# Patient Record
Sex: Male | Born: 1970 | Race: White | Hispanic: No | Marital: Married | Smoking: Former smoker
Health system: Southern US, Community
[De-identification: ages and names within clinical notes are randomized; demographics above are authoritative.]

## PROBLEM LIST (undated history)

## (undated) DIAGNOSIS — I2102 ST elevation (STEMI) myocardial infarction involving left anterior descending coronary artery: Secondary | ICD-10-CM

## (undated) DIAGNOSIS — I509 Heart failure, unspecified: Secondary | ICD-10-CM

## (undated) HISTORY — DX: ST elevation (STEMI) myocardial infarction involving left anterior descending coronary artery: I21.02

---

## 2019-07-02 DIAGNOSIS — I5042 Chronic combined systolic (congestive) and diastolic (congestive) heart failure: Secondary | ICD-10-CM

## 2019-07-02 HISTORY — DX: Chronic combined systolic (congestive) and diastolic (congestive) heart failure: I50.42

## 2019-07-02 HISTORY — PX: TRANSTHORACIC ECHOCARDIOGRAM: SHX275

## 2019-07-11 ENCOUNTER — Encounter (HOSPITAL_COMMUNITY): Payer: Self-pay | Admitting: Cardiology

## 2019-07-11 ENCOUNTER — Inpatient Hospital Stay (HOSPITAL_COMMUNITY)
Admission: EM | Admit: 2019-07-11 | Discharge: 2019-07-22 | DRG: 246 | Disposition: A | Discharge status: Home / Self Care | Payer: Managed Care, Other (non HMO) | Source: Other Acute Inpatient Hospital | Attending: Internal Medicine | Admitting: Internal Medicine

## 2019-07-11 ENCOUNTER — Encounter (HOSPITAL_COMMUNITY)
Admission: EM | Disposition: A | Discharge status: Home / Self Care | Payer: Self-pay | Source: Other Acute Inpatient Hospital | Attending: Cardiology

## 2019-07-11 ENCOUNTER — Inpatient Hospital Stay (HOSPITAL_COMMUNITY): Payer: Managed Care, Other (non HMO)

## 2019-07-11 ENCOUNTER — Other Ambulatory Visit: Payer: Self-pay

## 2019-07-11 DIAGNOSIS — Z978 Presence of other specified devices: Secondary | ICD-10-CM | POA: Diagnosis not present

## 2019-07-11 DIAGNOSIS — Q211 Atrial septal defect: Secondary | ICD-10-CM

## 2019-07-11 DIAGNOSIS — I2510 Atherosclerotic heart disease of native coronary artery without angina pectoris: Secondary | ICD-10-CM

## 2019-07-11 DIAGNOSIS — I4892 Unspecified atrial flutter: Secondary | ICD-10-CM | POA: Diagnosis present

## 2019-07-11 DIAGNOSIS — I213 ST elevation (STEMI) myocardial infarction of unspecified site: Secondary | ICD-10-CM | POA: Insufficient documentation

## 2019-07-11 DIAGNOSIS — R0602 Shortness of breath: Secondary | ICD-10-CM

## 2019-07-11 DIAGNOSIS — E876 Hypokalemia: Secondary | ICD-10-CM | POA: Diagnosis present

## 2019-07-11 DIAGNOSIS — G931 Anoxic brain damage, not elsewhere classified: Secondary | ICD-10-CM | POA: Diagnosis not present

## 2019-07-11 DIAGNOSIS — Y848 Other medical procedures as the cause of abnormal reaction of the patient, or of later complication, without mention of misadventure at the time of the procedure: Secondary | ICD-10-CM | POA: Diagnosis not present

## 2019-07-11 DIAGNOSIS — I255 Ischemic cardiomyopathy: Secondary | ICD-10-CM

## 2019-07-11 DIAGNOSIS — J9601 Acute respiratory failure with hypoxia: Secondary | ICD-10-CM | POA: Diagnosis present

## 2019-07-11 DIAGNOSIS — D72829 Elevated white blood cell count, unspecified: Secondary | ICD-10-CM | POA: Diagnosis present

## 2019-07-11 DIAGNOSIS — I2102 ST elevation (STEMI) myocardial infarction involving left anterior descending coronary artery: Secondary | ICD-10-CM | POA: Diagnosis present

## 2019-07-11 DIAGNOSIS — Z9861 Coronary angioplasty status: Secondary | ICD-10-CM | POA: Insufficient documentation

## 2019-07-11 DIAGNOSIS — E782 Mixed hyperlipidemia: Secondary | ICD-10-CM | POA: Diagnosis present

## 2019-07-11 DIAGNOSIS — Z20822 Contact with and (suspected) exposure to covid-19: Secondary | ICD-10-CM | POA: Diagnosis present

## 2019-07-11 DIAGNOSIS — I5021 Acute systolic (congestive) heart failure: Secondary | ICD-10-CM | POA: Diagnosis present

## 2019-07-11 DIAGNOSIS — J189 Pneumonia, unspecified organism: Secondary | ICD-10-CM

## 2019-07-11 DIAGNOSIS — G253 Myoclonus: Secondary | ICD-10-CM | POA: Diagnosis present

## 2019-07-11 DIAGNOSIS — R079 Chest pain, unspecified: Secondary | ICD-10-CM | POA: Diagnosis present

## 2019-07-11 DIAGNOSIS — E872 Acidosis: Secondary | ICD-10-CM | POA: Diagnosis present

## 2019-07-11 DIAGNOSIS — I4901 Ventricular fibrillation: Secondary | ICD-10-CM | POA: Insufficient documentation

## 2019-07-11 DIAGNOSIS — R34 Anuria and oliguria: Secondary | ICD-10-CM | POA: Diagnosis present

## 2019-07-11 DIAGNOSIS — J9811 Atelectasis: Secondary | ICD-10-CM | POA: Diagnosis not present

## 2019-07-11 DIAGNOSIS — K7200 Acute and subacute hepatic failure without coma: Secondary | ICD-10-CM

## 2019-07-11 DIAGNOSIS — I472 Ventricular tachycardia: Secondary | ICD-10-CM | POA: Diagnosis present

## 2019-07-11 DIAGNOSIS — I469 Cardiac arrest, cause unspecified: Secondary | ICD-10-CM | POA: Diagnosis present

## 2019-07-11 DIAGNOSIS — G9782 Other postprocedural complications and disorders of nervous system: Secondary | ICD-10-CM | POA: Diagnosis not present

## 2019-07-11 DIAGNOSIS — I462 Cardiac arrest due to underlying cardiac condition: Secondary | ICD-10-CM | POA: Diagnosis present

## 2019-07-11 DIAGNOSIS — J9690 Respiratory failure, unspecified, unspecified whether with hypoxia or hypercapnia: Secondary | ICD-10-CM

## 2019-07-11 DIAGNOSIS — I480 Paroxysmal atrial fibrillation: Secondary | ICD-10-CM | POA: Diagnosis not present

## 2019-07-11 DIAGNOSIS — E785 Hyperlipidemia, unspecified: Secondary | ICD-10-CM

## 2019-07-11 DIAGNOSIS — J80 Acute respiratory distress syndrome: Secondary | ICD-10-CM

## 2019-07-11 DIAGNOSIS — Z955 Presence of coronary angioplasty implant and graft: Secondary | ICD-10-CM

## 2019-07-11 DIAGNOSIS — G9340 Encephalopathy, unspecified: Secondary | ICD-10-CM

## 2019-07-11 DIAGNOSIS — J690 Pneumonitis due to inhalation of food and vomit: Secondary | ICD-10-CM | POA: Diagnosis not present

## 2019-07-11 DIAGNOSIS — R739 Hyperglycemia, unspecified: Secondary | ICD-10-CM | POA: Diagnosis present

## 2019-07-11 DIAGNOSIS — Z4659 Encounter for fitting and adjustment of other gastrointestinal appliance and device: Secondary | ICD-10-CM

## 2019-07-11 DIAGNOSIS — F329 Major depressive disorder, single episode, unspecified: Secondary | ICD-10-CM

## 2019-07-11 DIAGNOSIS — I313 Pericardial effusion (noninflammatory): Secondary | ICD-10-CM | POA: Diagnosis present

## 2019-07-11 DIAGNOSIS — I5022 Chronic systolic (congestive) heart failure: Secondary | ICD-10-CM

## 2019-07-11 DIAGNOSIS — Z452 Encounter for adjustment and management of vascular access device: Secondary | ICD-10-CM

## 2019-07-11 DIAGNOSIS — N179 Acute kidney failure, unspecified: Secondary | ICD-10-CM | POA: Diagnosis present

## 2019-07-11 DIAGNOSIS — Z9289 Personal history of other medical treatment: Secondary | ICD-10-CM

## 2019-07-11 DIAGNOSIS — F17210 Nicotine dependence, cigarettes, uncomplicated: Secondary | ICD-10-CM | POA: Diagnosis present

## 2019-07-11 DIAGNOSIS — F32A Depression, unspecified: Secondary | ICD-10-CM

## 2019-07-11 DIAGNOSIS — I959 Hypotension, unspecified: Secondary | ICD-10-CM

## 2019-07-11 DIAGNOSIS — R570 Cardiogenic shock: Secondary | ICD-10-CM | POA: Diagnosis present

## 2019-07-11 DIAGNOSIS — Z8673 Personal history of transient ischemic attack (TIA), and cerebral infarction without residual deficits: Secondary | ICD-10-CM

## 2019-07-11 DIAGNOSIS — G4700 Insomnia, unspecified: Secondary | ICD-10-CM | POA: Diagnosis not present

## 2019-07-11 HISTORY — PX: CORONARY STENT INTERVENTION: CATH118234

## 2019-07-11 HISTORY — DX: Ischemic cardiomyopathy: I25.5

## 2019-07-11 HISTORY — PX: RIGHT/LEFT HEART CATH AND CORONARY ANGIOGRAPHY: CATH118266

## 2019-07-11 HISTORY — PX: CORONARY/GRAFT ACUTE MI REVASCULARIZATION: CATH118305

## 2019-07-11 HISTORY — DX: Cardiac arrest, cause unspecified: I49.01

## 2019-07-11 HISTORY — DX: Atherosclerotic heart disease of native coronary artery without angina pectoris: I25.10

## 2019-07-11 HISTORY — PX: ARTERIAL LINE INSERTION: CATH118227

## 2019-07-11 HISTORY — DX: Cardiac arrest, cause unspecified: I46.9

## 2019-07-11 LAB — COMPREHENSIVE METABOLIC PANEL
ALT: 142 U/L — ABNORMAL HIGH (ref 0–44)
ALT: 197 U/L — ABNORMAL HIGH (ref 0–44)
AST: 483 U/L — ABNORMAL HIGH (ref 15–41)
AST: 851 U/L — ABNORMAL HIGH (ref 15–41)
Albumin: 3.6 g/dL (ref 3.5–5.0)
Albumin: 3.2 g/dL — ABNORMAL LOW (ref 3.5–5.0)
Alkaline Phosphatase: 87 U/L (ref 38–126)
Alkaline Phosphatase: 69 U/L (ref 38–126)
Anion gap: 14 (ref 5–15)
Anion gap: 11 (ref 5–15)
BUN: 21 mg/dL — ABNORMAL HIGH (ref 6–20)
BUN: 23 mg/dL — ABNORMAL HIGH (ref 6–20)
CO2: 17 mmol/L — ABNORMAL LOW (ref 22–32)
CO2: 20 mmol/L — ABNORMAL LOW (ref 22–32)
Calcium: 8.4 mg/dL — ABNORMAL LOW (ref 8.9–10.3)
Calcium: 7.6 mg/dL — ABNORMAL LOW (ref 8.9–10.3)
Chloride: 104 mmol/L (ref 98–111)
Chloride: 108 mmol/L (ref 98–111)
Creatinine, Ser: 1.41 mg/dL — ABNORMAL HIGH (ref 0.61–1.24)
Creatinine, Ser: 1.18 mg/dL (ref 0.61–1.24)
GFR calc Af Amer: >60 mL/min (ref >60)
GFR calc Af Amer: >60 mL/min (ref >60)
GFR calc non Af Amer: 59 mL/min — ABNORMAL LOW (ref >60)
GFR calc non Af Amer: >60 mL/min (ref >60)
Glucose, Bld: 249 mg/dL — ABNORMAL HIGH (ref 70–99)
Glucose, Bld: 166 mg/dL — ABNORMAL HIGH (ref 70–99)
Potassium: 4.4 mmol/L (ref 3.5–5.1)
Potassium: 3.3 mmol/L — ABNORMAL LOW (ref 3.5–5.1)
Sodium: 135 mmol/L (ref 135–145)
Sodium: 139 mmol/L (ref 135–145)
Total Bilirubin: 0.9 mg/dL (ref 0.3–1.2)
Total Bilirubin: 0.9 mg/dL (ref 0.3–1.2)
Total Protein: 6.3 g/dL — ABNORMAL LOW (ref 6.5–8.1)
Total Protein: 5.5 g/dL — ABNORMAL LOW (ref 6.5–8.1)

## 2019-07-11 LAB — POCT I-STAT 7, (LYTES, BLD GAS, ICA,H+H)
Acid-base deficit: 10 mmol/L — ABNORMAL HIGH (ref 0.0–2.0)
Acid-base deficit: 3 mmol/L — ABNORMAL HIGH (ref 0.0–2.0)
Acid-base deficit: 8 mmol/L — ABNORMAL HIGH (ref 0.0–2.0)
Acid-base deficit: 7 mmol/L — ABNORMAL HIGH (ref 0.0–2.0)
Bicarbonate: 19.5 mmol/L — ABNORMAL LOW (ref 20.0–28.0)
Bicarbonate: 19.3 mmol/L — ABNORMAL LOW (ref 20.0–28.0)
Bicarbonate: 24.7 mmol/L (ref 20.0–28.0)
Bicarbonate: 16.5 mmol/L — ABNORMAL LOW (ref 20.0–28.0)
Calcium, Ion: 1.2 mmol/L (ref 1.15–1.40)
Calcium, Ion: 1.11 mmol/L — ABNORMAL LOW (ref 1.15–1.40)
Calcium, Ion: 1.17 mmol/L (ref 1.15–1.40)
Calcium, Ion: 1.16 mmol/L (ref 1.15–1.40)
HCT: 47 % (ref 39.0–52.0)
HCT: 45 % (ref 39.0–52.0)
HCT: 46 % (ref 39.0–52.0)
HCT: 43 % (ref 39.0–52.0)
Hemoglobin: 16 g/dL (ref 13.0–17.0)
Hemoglobin: 15.3 g/dL (ref 13.0–17.0)
Hemoglobin: 15.6 g/dL (ref 13.0–17.0)
Hemoglobin: 14.6 g/dL (ref 13.0–17.0)
O2 Saturation: 99 %
O2 Saturation: 100 %
O2 Saturation: 99 %
O2 Saturation: 98 %
Patient temperature: 35.6
Potassium: 4.4 mmol/L (ref 3.5–5.1)
Potassium: 4.3 mmol/L (ref 3.5–5.1)
Potassium: 4.5 mmol/L (ref 3.5–5.1)
Potassium: 6 mmol/L — ABNORMAL HIGH (ref 3.5–5.1)
Sodium: 136 mmol/L (ref 135–145)
Sodium: 135 mmol/L (ref 135–145)
Sodium: 139 mmol/L (ref 135–145)
Sodium: 137 mmol/L (ref 135–145)
TCO2: 21 mmol/L — ABNORMAL LOW (ref 22–32)
TCO2: 21 mmol/L — ABNORMAL LOW (ref 22–32)
TCO2: 26 mmol/L (ref 22–32)
TCO2: 17 mmol/L — ABNORMAL LOW (ref 22–32)
pCO2 arterial: 53.6 mmHg — ABNORMAL HIGH (ref 32.0–48.0)
pCO2 arterial: 46.2 mmHg (ref 32.0–48.0)
pCO2 arterial: 53.5 mmHg — ABNORMAL HIGH (ref 32.0–48.0)
pCO2 arterial: 26.7 mmHg — ABNORMAL LOW (ref 32.0–48.0)
pH, Arterial: 7.169 — CL (ref 7.350–7.450)
pH, Arterial: 7.229 — ABNORMAL LOW (ref 7.350–7.450)
pH, Arterial: 7.272 — ABNORMAL LOW (ref 7.350–7.450)
pH, Arterial: 7.393 (ref 7.350–7.450)
pO2, Arterial: 187 mmHg — ABNORMAL HIGH (ref 83.0–108.0)
pO2, Arterial: 164 mmHg — ABNORMAL HIGH (ref 83.0–108.0)
pO2, Arterial: 230 mmHg — ABNORMAL HIGH (ref 83.0–108.0)
pO2, Arterial: 101 mmHg (ref 83.0–108.0)

## 2019-07-11 LAB — POCT ACTIVATED CLOTTING TIME
Activated Clotting Time: 263 seconds
Activated Clotting Time: 307 seconds

## 2019-07-11 LAB — POCT I-STAT EG7
Acid-base deficit: 1 mmol/L (ref 0.0–2.0)
Acid-base deficit: 1 mmol/L (ref 0.0–2.0)
Bicarbonate: 27 mmol/L (ref 20.0–28.0)
Bicarbonate: 27.4 mmol/L (ref 20.0–28.0)
Calcium, Ion: 1.11 mmol/L — ABNORMAL LOW (ref 1.15–1.40)
Calcium, Ion: 1.11 mmol/L — ABNORMAL LOW (ref 1.15–1.40)
HCT: 45 % (ref 39.0–52.0)
HCT: 45 % (ref 39.0–52.0)
Hemoglobin: 15.3 g/dL (ref 13.0–17.0)
Hemoglobin: 15.3 g/dL (ref 13.0–17.0)
O2 Saturation: 67 %
O2 Saturation: 68 %
Potassium: 4.3 mmol/L (ref 3.5–5.1)
Potassium: 4.3 mmol/L (ref 3.5–5.1)
Sodium: 140 mmol/L (ref 135–145)
Sodium: 140 mmol/L (ref 135–145)
TCO2: 29 mmol/L (ref 22–32)
TCO2: 29 mmol/L (ref 22–32)
pCO2, Ven: 58.7 mmHg (ref 44.0–60.0)
pCO2, Ven: 59.6 mmHg (ref 44.0–60.0)
pH, Ven: 7.27 (ref 7.250–7.430)
pH, Ven: 7.271 (ref 7.250–7.430)
pO2, Ven: 41 mmHg (ref 32.0–45.0)
pO2, Ven: 41 mmHg (ref 32.0–45.0)

## 2019-07-11 LAB — BASIC METABOLIC PANEL
Anion gap: 9 (ref 5–15)
BUN: 31 mg/dL — ABNORMAL HIGH (ref 6–20)
CO2: 19 mmol/L — ABNORMAL LOW (ref 22–32)
Calcium: 7.5 mg/dL — ABNORMAL LOW (ref 8.9–10.3)
Chloride: 111 mmol/L (ref 98–111)
Creatinine, Ser: 1.65 mg/dL — ABNORMAL HIGH (ref 0.61–1.24)
GFR calc Af Amer: 57 mL/min — ABNORMAL LOW (ref >60)
GFR calc non Af Amer: 49 mL/min — ABNORMAL LOW (ref >60)
Glucose, Bld: 120 mg/dL — ABNORMAL HIGH (ref 70–99)
Potassium: 5.6 mmol/L — ABNORMAL HIGH (ref 3.5–5.1)
Sodium: 139 mmol/L (ref 135–145)

## 2019-07-11 LAB — GLUCOSE, CAPILLARY
Glucose-Capillary: 110 mg/dL — ABNORMAL HIGH (ref 70–99)
Glucose-Capillary: 130 mg/dL — ABNORMAL HIGH (ref 70–99)
Glucose-Capillary: 104 mg/dL — ABNORMAL HIGH (ref 70–99)
Glucose-Capillary: 111 mg/dL — ABNORMAL HIGH (ref 70–99)
Glucose-Capillary: 94 mg/dL (ref 70–99)

## 2019-07-11 LAB — CBC
HCT: 47.6 % (ref 39.0–52.0)
HCT: 46.3 % (ref 39.0–52.0)
Hemoglobin: 15.6 g/dL (ref 13.0–17.0)
Hemoglobin: 15.5 g/dL (ref 13.0–17.0)
MCH: 31.1 pg (ref 26.0–34.0)
MCH: 30.8 pg (ref 26.0–34.0)
MCHC: 32.8 g/dL (ref 30.0–36.0)
MCHC: 33.5 g/dL (ref 30.0–36.0)
MCV: 94.8 fL (ref 80.0–100.0)
MCV: 91.9 fL (ref 80.0–100.0)
Platelets: 269 10*3/uL (ref 150–400)
Platelets: 255 10*3/uL (ref 150–400)
RBC: 5.02 MIL/uL (ref 4.22–5.81)
RBC: 5.04 MIL/uL (ref 4.22–5.81)
RDW: 11.9 % (ref 11.5–15.5)
RDW: 12 % (ref 11.5–15.5)
WBC: 27.3 10*3/uL — ABNORMAL HIGH (ref 4.0–10.5)
WBC: 18.8 10*3/uL — ABNORMAL HIGH (ref 4.0–10.5)
nRBC: 0 % (ref 0.0–0.2)
nRBC: 0 % (ref 0.0–0.2)

## 2019-07-11 LAB — POCT I-STAT, CHEM 8
BUN: 23 mg/dL — ABNORMAL HIGH (ref 6–20)
BUN: 25 mg/dL — ABNORMAL HIGH (ref 6–20)
Calcium, Ion: 1.19 mmol/L (ref 1.15–1.40)
Calcium, Ion: 1.09 mmol/L — ABNORMAL LOW (ref 1.15–1.40)
Chloride: 105 mmol/L (ref 98–111)
Chloride: 109 mmol/L (ref 98–111)
Creatinine, Ser: 1.3 mg/dL — ABNORMAL HIGH (ref 0.61–1.24)
Creatinine, Ser: 1 mg/dL (ref 0.61–1.24)
Glucose, Bld: 244 mg/dL — ABNORMAL HIGH (ref 70–99)
Glucose, Bld: 148 mg/dL — ABNORMAL HIGH (ref 70–99)
HCT: 47 % (ref 39.0–52.0)
HCT: 43 % (ref 39.0–52.0)
Hemoglobin: 16 g/dL (ref 13.0–17.0)
Hemoglobin: 14.6 g/dL (ref 13.0–17.0)
Potassium: 4.4 mmol/L (ref 3.5–5.1)
Potassium: 3.6 mmol/L (ref 3.5–5.1)
Sodium: 136 mmol/L (ref 135–145)
Sodium: 138 mmol/L (ref 135–145)
TCO2: 20 mmol/L — ABNORMAL LOW (ref 22–32)
TCO2: 21 mmol/L — ABNORMAL LOW (ref 22–32)

## 2019-07-11 LAB — COOXEMETRY PANEL
Carboxyhemoglobin: 0.4 % — ABNORMAL LOW (ref 0.5–1.5)
Methemoglobin: 0.8 % (ref 0.0–1.5)
O2 Saturation: 56.2 %
Total hemoglobin: 12.8 g/dL (ref 12.0–16.0)

## 2019-07-11 LAB — MAGNESIUM
Magnesium: 2.6 mg/dL — ABNORMAL HIGH (ref 1.7–2.4)
Magnesium: 2.5 mg/dL — ABNORMAL HIGH (ref 1.7–2.4)
Magnesium: 2.2 mg/dL (ref 1.7–2.4)

## 2019-07-11 LAB — PHOSPHORUS
Phosphorus: 2.6 mg/dL (ref 2.5–4.6)
Phosphorus: 2.2 mg/dL — ABNORMAL LOW (ref 2.5–4.6)
Phosphorus: 2.9 mg/dL (ref 2.5–4.6)

## 2019-07-11 LAB — LIPID PANEL
Cholesterol: 226 mg/dL — ABNORMAL HIGH (ref 0–200)
HDL: 34 mg/dL — ABNORMAL LOW (ref >40)
LDL Cholesterol: 132 mg/dL — ABNORMAL HIGH (ref 0–99)
Total CHOL/HDL Ratio: 6.6 RATIO
Triglycerides: 298 mg/dL — ABNORMAL HIGH (ref <150)
VLDL: 60 mg/dL — ABNORMAL HIGH (ref 0–40)

## 2019-07-11 LAB — APTT
aPTT: 120 seconds — ABNORMAL HIGH (ref 24–36)
aPTT: >200 seconds (ref 24–36)

## 2019-07-11 LAB — TSH: TSH: 2.711 u[IU]/mL (ref 0.350–4.500)

## 2019-07-11 LAB — DIFFERENTIAL
Abs Immature Granulocytes: 0.12 10*3/uL — ABNORMAL HIGH (ref 0.00–0.07)
Basophils Absolute: 0.1 10*3/uL (ref 0.0–0.1)
Basophils Relative: 0 %
Eosinophils Absolute: 0.1 10*3/uL (ref 0.0–0.5)
Eosinophils Relative: 0 %
Immature Granulocytes: 1 %
Lymphocytes Relative: 11 %
Lymphs Abs: 2.1 10*3/uL (ref 0.7–4.0)
Monocytes Absolute: 1 10*3/uL (ref 0.1–1.0)
Monocytes Relative: 5 %
Neutro Abs: 15.4 10*3/uL — ABNORMAL HIGH (ref 1.7–7.7)
Neutrophils Relative %: 83 %

## 2019-07-11 LAB — TROPONIN I (HIGH SENSITIVITY)
Troponin I (High Sensitivity): 25289 ng/L (ref <18)
Troponin I (High Sensitivity): >27000 ng/L (ref <18)
Troponin I (High Sensitivity): >27000 ng/L (ref <18)

## 2019-07-11 LAB — URINALYSIS, ROUTINE W REFLEX MICROSCOPIC
Bilirubin Urine: NEGATIVE
Glucose, UA: 50 mg/dL — AB
Ketones, ur: NEGATIVE mg/dL
Nitrite: NEGATIVE
Protein, ur: 100 mg/dL — AB
RBC / HPF: >50 RBC/hpf — ABNORMAL HIGH (ref 0–5)
Specific Gravity, Urine: >1.046 — ABNORMAL HIGH (ref 1.005–1.030)
pH: 5 (ref 5.0–8.0)

## 2019-07-11 LAB — MRSA PCR SCREENING: MRSA by PCR: NEGATIVE

## 2019-07-11 LAB — ECHOCARDIOGRAM COMPLETE
Height: 74 in
Weight: 3985.92 oz

## 2019-07-11 LAB — PROTIME-INR
INR: 1.1 (ref 0.8–1.2)
INR: 1.2 (ref 0.8–1.2)
Prothrombin Time: 14 seconds (ref 11.4–15.2)
Prothrombin Time: 15 seconds (ref 11.4–15.2)

## 2019-07-11 LAB — SARS CORONAVIRUS 2 (TAT 6-24 HRS): SARS Coronavirus 2: NEGATIVE

## 2019-07-11 LAB — BRAIN NATRIURETIC PEPTIDE: B Natriuretic Peptide: 23.3 pg/mL (ref 0.0–100.0)

## 2019-07-11 LAB — PROCALCITONIN: Procalcitonin: 0.8 ng/mL

## 2019-07-11 LAB — HEMOGLOBIN A1C
Hgb A1c MFr Bld: 5.4 % (ref 4.8–5.6)
Mean Plasma Glucose: 108.28 mg/dL

## 2019-07-11 LAB — LACTIC ACID, PLASMA: Lactic Acid, Venous: 1.8 mmol/L (ref 0.5–1.9)

## 2019-07-11 LAB — BILIRUBIN, DIRECT: Bilirubin, Direct: 0.2 mg/dL (ref 0.0–0.2)

## 2019-07-11 SURGERY — CORONARY/GRAFT ACUTE MI REVASCULARIZATION
Anesthesia: LOCAL

## 2019-07-11 MED ORDER — SODIUM CHLORIDE 0.9 % IV SOLN
INTRAVENOUS | Status: DC | PRN
  Administered 2019-07-11: 3 mL/h via INTRAVENOUS

## 2019-07-11 MED ORDER — MIDAZOLAM HCL 2 MG/2ML IJ SOLN
INTRAMUSCULAR | Status: AC
  Filled 2019-07-11: qty 2

## 2019-07-11 MED ORDER — HEPARIN (PORCINE) IN NACL 1000-0.9 UT/500ML-% IV SOLN
INTRAVENOUS | Status: AC
  Filled 2019-07-11: qty 500

## 2019-07-11 MED ORDER — ROCURONIUM BROMIDE 50 MG/5ML IV SOLN
INTRAVENOUS | Status: DC | PRN
  Administered 2019-07-11: 100 mg via INTRAVENOUS

## 2019-07-11 MED ORDER — SODIUM CHLORIDE 0.9% FLUSH
10.0000 mL | Freq: Two times a day (BID) | INTRAVENOUS | Status: DC
  Administered 2019-07-11: 30 mL
  Administered 2019-07-12: 20 mL
  Administered 2019-07-12 – 2019-07-22 (×14): 10 mL

## 2019-07-11 MED ORDER — LIDOCAINE HCL (PF) 1 % IJ SOLN
INTRAMUSCULAR | Status: AC
  Filled 2019-07-11: qty 30

## 2019-07-11 MED ORDER — LIDOCAINE HCL (PF) 1 % IJ SOLN
INTRAMUSCULAR | Status: DC | PRN
  Administered 2019-07-11: 10 mL
  Administered 2019-07-11 (×2): 2 mL

## 2019-07-11 MED ORDER — ENOXAPARIN SODIUM 30 MG/0.3ML ~~LOC~~ SOLN: 30.0000 mg | SUBCUTANEOUS | Status: DC

## 2019-07-11 MED ORDER — BUSPIRONE HCL 10 MG PO TABS
30.0000 mg | ORAL_TABLET | Freq: Two times a day (BID) | ORAL | Status: DC
  Administered 2019-07-11 – 2019-07-12 (×2): 30 mg via ORAL
  Filled 2019-07-11 (×2): qty 3

## 2019-07-11 MED ORDER — SODIUM CHLORIDE 0.9% FLUSH
3.0000 mL | Freq: Two times a day (BID) | INTRAVENOUS | Status: DC
  Administered 2019-07-11 – 2019-07-14 (×7): 3 mL via INTRAVENOUS

## 2019-07-11 MED ORDER — PANTOPRAZOLE SODIUM 40 MG PO PACK
40.0000 mg | PACK | Freq: Every day | ORAL | Status: DC
  Administered 2019-07-11 – 2019-07-13 (×3): 40 mg
  Filled 2019-07-11 (×4): qty 20

## 2019-07-11 MED ORDER — PRO-STAT SUGAR FREE PO LIQD
30.0000 mL | Freq: Four times a day (QID) | ORAL | Status: DC
  Administered 2019-07-11 – 2019-07-12 (×4): 30 mL
  Filled 2019-07-11 (×4): qty 30

## 2019-07-11 MED ORDER — IOHEXOL 350 MG/ML SOLN
INTRAVENOUS | Status: DC | PRN
  Administered 2019-07-11: 120 mL via INTRA_ARTERIAL

## 2019-07-11 MED ORDER — HEPARIN (PORCINE) IN NACL 1000-0.9 UT/500ML-% IV SOLN
INTRAVENOUS | Status: DC | PRN
  Administered 2019-07-11 (×2): 500 mL

## 2019-07-11 MED ORDER — ACETAMINOPHEN 325 MG PO TABS: 650.0000 mg | ORAL_TABLET | ORAL | Status: DC | PRN

## 2019-07-11 MED ORDER — FENTANYL 2500MCG IN NS 250ML (10MCG/ML) PREMIX INFUSION
0.0000 ug/h | Freq: Once | INTRAVENOUS | Status: AC
  Administered 2019-07-11: 50 ug/h via INTRAVENOUS
  Administered 2019-07-11: 100 ug/h via INTRAVENOUS
  Administered 2019-07-11: 50 ug/h via INTRAVENOUS
  Filled 2019-07-11: qty 250

## 2019-07-11 MED ORDER — SODIUM CHLORIDE 0.9 % IV SOLN
2000.0000 mg | Freq: Once | INTRAVENOUS | Status: AC
  Administered 2019-07-11: 2000 mg via INTRAVENOUS
  Filled 2019-07-11: qty 20

## 2019-07-11 MED ORDER — ONDANSETRON HCL 4 MG/2ML IJ SOLN
4.0000 mg | Freq: Four times a day (QID) | INTRAMUSCULAR | Status: DC | PRN
  Administered 2019-07-14: 4 mg via INTRAVENOUS
  Filled 2019-07-11: qty 2

## 2019-07-11 MED ORDER — NOREPINEPHRINE 4 MG/250ML-% IV SOLN
2.0000 ug/min | INTRAVENOUS | Status: DC
  Administered 2019-07-11: 22 ug/min via INTRAVENOUS
  Administered 2019-07-11: 24 ug/min via INTRAVENOUS
  Administered 2019-07-11: 20 ug/min via INTRAVENOUS
  Administered 2019-07-11: 16 ug/min via INTRAVENOUS
  Filled 2019-07-11 (×3): qty 250

## 2019-07-11 MED ORDER — AMIODARONE HCL IN DEXTROSE 360-4.14 MG/200ML-% IV SOLN
30.0000 mg/h | INTRAVENOUS | Status: DC
  Administered 2019-07-11 – 2019-07-16 (×9): 30 mg/h via INTRAVENOUS
  Filled 2019-07-11 (×9): qty 200

## 2019-07-11 MED ORDER — VITAL HIGH PROTEIN PO LIQD
1000.0000 mL | ORAL | Status: DC
  Administered 2019-07-11 – 2019-07-12 (×2): 1000 mL

## 2019-07-11 MED ORDER — ASPIRIN 81 MG PO CHEW: 81.0000 mg | CHEWABLE_TABLET | Freq: Every day | ORAL | Status: DC

## 2019-07-11 MED ORDER — SODIUM CHLORIDE 0.9 % IV SOLN
INTRAVENOUS | Status: DC | PRN
  Administered 2019-07-11: 4 ug/kg/min
  Administered 2019-07-11: 02:00:00 4 ug/kg/min via INTRAVENOUS

## 2019-07-11 MED ORDER — SODIUM CHLORIDE 0.9 % IV SOLN
3.0000 g | Freq: Four times a day (QID) | INTRAVENOUS | Status: AC
  Administered 2019-07-11 – 2019-07-13 (×11): 3 g via INTRAVENOUS
  Filled 2019-07-11 (×11): qty 3

## 2019-07-11 MED ORDER — ASPIRIN EC 81 MG PO TBEC: 81.0000 mg | DELAYED_RELEASE_TABLET | Freq: Every day | ORAL | Status: DC

## 2019-07-11 MED ORDER — FENTANYL CITRATE (PF) 100 MCG/2ML IJ SOLN
INTRAMUSCULAR | Status: DC | PRN
  Administered 2019-07-11 (×3): 50 ug via INTRAVENOUS

## 2019-07-11 MED ORDER — SODIUM CHLORIDE 0.9 % IV SOLN
INTRAVENOUS | Status: DC | PRN
  Administered 2019-07-11: 20 mL/h via INTRAVENOUS

## 2019-07-11 MED ORDER — PROPOFOL 1000 MG/100ML IV EMUL
5.0000 ug/kg/min | INTRAVENOUS | Status: DC
  Administered 2019-07-11: 45 ug/kg/min via INTRAVENOUS
  Administered 2019-07-11: 20 ug/kg/min via INTRAVENOUS
  Administered 2019-07-11: 35 ug/kg/min via INTRAVENOUS
  Administered 2019-07-11: 19:00:00 50 ug/kg/min via INTRAVENOUS
  Administered 2019-07-11: 40 ug/kg/min via INTRAVENOUS
  Administered 2019-07-11: 35 ug/kg/min via INTRAVENOUS
  Administered 2019-07-12 (×3): 40 ug/kg/min via INTRAVENOUS
  Administered 2019-07-12: 45 ug/kg/min via INTRAVENOUS
  Administered 2019-07-12: 02:00:00 50 ug/kg/min via INTRAVENOUS
  Administered 2019-07-12: 45 ug/kg/min via INTRAVENOUS
  Administered 2019-07-12 – 2019-07-13 (×3): 40 ug/kg/min via INTRAVENOUS
  Filled 2019-07-11 (×15): qty 100

## 2019-07-11 MED ORDER — TICAGRELOR 90 MG PO TABS: 90.0000 mg | ORAL_TABLET | Freq: Two times a day (BID) | ORAL | Status: DC

## 2019-07-11 MED ORDER — AMIODARONE HCL IN DEXTROSE 360-4.14 MG/200ML-% IV SOLN
60.0000 mg/h | INTRAVENOUS | Status: AC
  Administered 2019-07-11: 60 mg/h via INTRAVENOUS
  Filled 2019-07-11 (×2): qty 200

## 2019-07-11 MED ORDER — NOREPINEPHRINE 4 MG/250ML-% IV SOLN
INTRAVENOUS | Status: AC
  Filled 2019-07-11: qty 250

## 2019-07-11 MED ORDER — NOREPINEPHRINE 16 MG/250ML-% IV SOLN
2.0000 ug/min | INTRAVENOUS | Status: DC
  Administered 2019-07-11: 25 ug/min via INTRAVENOUS
  Administered 2019-07-12 – 2019-07-13 (×3): 26 ug/min via INTRAVENOUS
  Administered 2019-07-14: 2 ug/min via INTRAVENOUS
  Filled 2019-07-11 (×5): qty 250

## 2019-07-11 MED ORDER — ATORVASTATIN CALCIUM 80 MG PO TABS: 80.0000 mg | ORAL_TABLET | Freq: Every day | ORAL | Status: DC

## 2019-07-11 MED ORDER — ASPIRIN 300 MG RE SUPP
300.0000 mg | RECTAL | Status: AC
  Filled 2019-07-11: qty 1

## 2019-07-11 MED ORDER — FAMOTIDINE IN NACL 20-0.9 MG/50ML-% IV SOLN: 20.0000 mg | Freq: Two times a day (BID) | INTRAVENOUS | Status: DC

## 2019-07-11 MED ORDER — SODIUM CHLORIDE 0.9 % IV SOLN
0.7500 ug/kg/min | INTRAVENOUS | Status: DC
  Filled 2019-07-11: qty 50

## 2019-07-11 MED ORDER — INSULIN ASPART 100 UNIT/ML ~~LOC~~ SOLN
0.0000 [IU] | SUBCUTANEOUS | Status: DC
  Administered 2019-07-11 – 2019-07-12 (×5): 1 [IU] via SUBCUTANEOUS
  Administered 2019-07-13: 2 [IU] via SUBCUTANEOUS
  Administered 2019-07-13 – 2019-07-14 (×7): 1 [IU] via SUBCUTANEOUS
  Administered 2019-07-14: 2 [IU] via SUBCUTANEOUS
  Administered 2019-07-15 – 2019-07-20 (×9): 1 [IU] via SUBCUTANEOUS
  Filled 2019-07-11: qty 0.09

## 2019-07-11 MED ORDER — SODIUM CHLORIDE 0.9 % IV SOLN: INTRAVENOUS | Status: AC

## 2019-07-11 MED ORDER — FENTANYL 2500MCG IN NS 250ML (10MCG/ML) PREMIX INFUSION
0.0000 ug/h | INTRAVENOUS | Status: DC
  Administered 2019-07-11: 200 ug/h via INTRAVENOUS
  Administered 2019-07-12 (×3): 300 ug/h via INTRAVENOUS
  Administered 2019-07-12: 200 ug/h via INTRAVENOUS
  Administered 2019-07-13: 300 ug/h via INTRAVENOUS
  Filled 2019-07-11 (×7): qty 250

## 2019-07-11 MED ORDER — FENTANYL CITRATE (PF) 100 MCG/2ML IJ SOLN
INTRAMUSCULAR | Status: AC
  Filled 2019-07-11: qty 2

## 2019-07-11 MED ORDER — MIDAZOLAM 50MG/50ML (1MG/ML) PREMIX INFUSION
0.5000 mg/h | INTRAVENOUS | Status: DC
  Administered 2019-07-11: 2 mg/h via INTRAVENOUS
  Administered 2019-07-11: 4 mg/h via INTRAVENOUS
  Filled 2019-07-11: qty 50

## 2019-07-11 MED ORDER — ATORVASTATIN CALCIUM 40 MG PO TABS: 40.0000 mg | ORAL_TABLET | Freq: Every day | ORAL | Status: DC

## 2019-07-11 MED ORDER — SODIUM CHLORIDE 0.9 % IV SOLN: INTRAVENOUS | Status: DC

## 2019-07-11 MED ORDER — POTASSIUM CHLORIDE 10 MEQ/50ML IV SOLN
10.0000 meq | INTRAVENOUS | Status: AC
  Administered 2019-07-11 (×2): 10 meq via INTRAVENOUS
  Filled 2019-07-11: qty 50

## 2019-07-11 MED ORDER — ORAL CARE MOUTH RINSE
15.0000 mL | OROMUCOSAL | Status: DC
  Administered 2019-07-11 – 2019-07-16 (×46): 15 mL via OROMUCOSAL

## 2019-07-11 MED ORDER — LEVETIRACETAM IN NACL 500 MG/100ML IV SOLN
500.0000 mg | Freq: Two times a day (BID) | INTRAVENOUS | Status: DC
  Administered 2019-07-11 – 2019-07-17 (×11): 500 mg via INTRAVENOUS
  Filled 2019-07-11 (×12): qty 100

## 2019-07-11 MED ORDER — ASPIRIN 81 MG PO CHEW
81.0000 mg | CHEWABLE_TABLET | Freq: Every day | ORAL | Status: DC
  Administered 2019-07-12 – 2019-07-16 (×5): 81 mg
  Filled 2019-07-11 (×6): qty 1

## 2019-07-11 MED ORDER — SODIUM CHLORIDE 0.9% FLUSH: 3.0000 mL | INTRAVENOUS | Status: DC | PRN

## 2019-07-11 MED ORDER — SODIUM BICARBONATE 8.4 % IV SOLN
INTRAVENOUS | Status: DC | PRN
  Administered 2019-07-11 (×2): 50 meq via INTRAVENOUS

## 2019-07-11 MED ORDER — VERAPAMIL HCL 2.5 MG/ML IV SOLN
INTRAVENOUS | Status: DC | PRN
  Administered 2019-07-11: 10 mL via INTRA_ARTERIAL

## 2019-07-11 MED ORDER — EPINEPHRINE 1 MG/10ML IJ SOSY
PREFILLED_SYRINGE | INTRAMUSCULAR | Status: AC
  Filled 2019-07-11: qty 20

## 2019-07-11 MED ORDER — HEPARIN SODIUM (PORCINE) 5000 UNIT/ML IJ SOLN
5000.0000 [IU] | Freq: Three times a day (TID) | INTRAMUSCULAR | Status: DC
  Administered 2019-07-11 – 2019-07-17 (×17): 5000 [IU] via SUBCUTANEOUS
  Filled 2019-07-11 (×18): qty 1

## 2019-07-11 MED ORDER — SODIUM CHLORIDE 0.9 % IV SOLN
0.7500 ug/kg/min | INTRAVENOUS | Status: DC
  Filled 2019-07-11 (×2): qty 50

## 2019-07-11 MED ORDER — ACETAMINOPHEN 160 MG/5ML PO SOLN
650.0000 mg | ORAL | Status: DC | PRN
  Administered 2019-07-11 – 2019-07-16 (×6): 650 mg
  Filled 2019-07-11 (×7): qty 20.3

## 2019-07-11 MED ORDER — NOREPINEPHRINE 4 MG/250ML-% IV SOLN
0.0000 ug/min | INTRAVENOUS | Status: DC
  Filled 2019-07-11: qty 250

## 2019-07-11 MED ORDER — CHLORHEXIDINE GLUCONATE CLOTH 2 % EX PADS
6.0000 | MEDICATED_PAD | Freq: Every day | CUTANEOUS | Status: DC
  Administered 2019-07-11 – 2019-07-22 (×12): 6 via TOPICAL

## 2019-07-11 MED ORDER — NOREPINEPHRINE BITARTRATE 1 MG/ML IV SOLN
INTRAVENOUS | Status: AC | PRN
  Administered 2019-07-11: 03:00:00 5 ug/min via INTRAVENOUS

## 2019-07-11 MED ORDER — PROPOFOL 1000 MG/100ML IV EMUL: 5.0000 ug/kg/min | INTRAVENOUS | Status: DC

## 2019-07-11 MED ORDER — SODIUM CHLORIDE 0.9 % IV SOLN: 250.0000 mL | INTRAVENOUS | Status: DC | PRN

## 2019-07-11 MED ORDER — HEPARIN SODIUM (PORCINE) 1000 UNIT/ML IJ SOLN
INTRAMUSCULAR | Status: AC
  Filled 2019-07-11: qty 1

## 2019-07-11 MED ORDER — SODIUM CHLORIDE 0.9 % IV SOLN
INTRAVENOUS | Status: DC | PRN
  Administered 2019-07-11: 10 mL/h via INTRAVENOUS

## 2019-07-11 MED ORDER — CANGRELOR TETRASODIUM 50 MG IV SOLR
INTRAVENOUS | Status: AC
  Filled 2019-07-11: qty 50

## 2019-07-11 MED ORDER — CANGRELOR BOLUS VIA INFUSION
INTRAVENOUS | Status: DC | PRN
  Administered 2019-07-11: 02:00:00 3390 ug via INTRAVENOUS

## 2019-07-11 MED ORDER — SODIUM CHLORIDE 0.9 % IV BOLUS
500.0000 mL | Freq: Once | INTRAVENOUS | Status: AC
  Administered 2019-07-13: 500 mL via INTRAVENOUS

## 2019-07-11 MED ORDER — SODIUM CHLORIDE 0.9% FLUSH: 10.0000 mL | INTRAVENOUS | Status: DC | PRN

## 2019-07-11 MED ORDER — SODIUM BICARBONATE 8.4 % IV SOLN
INTRAVENOUS | Status: AC
  Filled 2019-07-11: qty 100

## 2019-07-11 MED ORDER — TICAGRELOR 90 MG PO TABS
180.0000 mg | ORAL_TABLET | Freq: Once | ORAL | Status: DC
  Administered 2019-07-11: 180 mg via ORAL

## 2019-07-11 MED ORDER — IOHEXOL 350 MG/ML SOLN
INTRAVENOUS | Status: AC
  Filled 2019-07-11: qty 1

## 2019-07-11 MED ORDER — HEPARIN SODIUM (PORCINE) 1000 UNIT/ML IJ SOLN
INTRAMUSCULAR | Status: DC | PRN
  Administered 2019-07-11: 3000 [IU] via INTRAVENOUS
  Administered 2019-07-11: 8000 [IU] via INTRAVENOUS

## 2019-07-11 MED ORDER — TICAGRELOR 90 MG PO TABS
180.0000 mg | ORAL_TABLET | Freq: Once | ORAL | Status: AC
  Filled 2019-07-11: qty 2

## 2019-07-11 MED ORDER — CHLORHEXIDINE GLUCONATE 0.12% ORAL RINSE (MEDLINE KIT)
15.0000 mL | Freq: Two times a day (BID) | OROMUCOSAL | Status: DC
  Administered 2019-07-11 – 2019-07-22 (×15): 15 mL via OROMUCOSAL

## 2019-07-11 MED ORDER — ROCURONIUM BROMIDE 50 MG/5ML IV SOLN
100.0000 mg | Freq: Once | INTRAVENOUS | Status: AC
  Filled 2019-07-11: qty 10

## 2019-07-11 MED ORDER — MIDAZOLAM HCL 2 MG/2ML IJ SOLN
INTRAMUSCULAR | Status: DC | PRN
  Administered 2019-07-11 (×3): 2 mg via INTRAVENOUS

## 2019-07-11 MED ORDER — VERAPAMIL HCL 2.5 MG/ML IV SOLN
INTRAVENOUS | Status: AC
  Filled 2019-07-11: qty 2

## 2019-07-11 MED ORDER — TICAGRELOR 90 MG PO TABS
90.0000 mg | ORAL_TABLET | Freq: Two times a day (BID) | ORAL | Status: DC
  Administered 2019-07-11 – 2019-07-13 (×4): 90 mg
  Filled 2019-07-11 (×4): qty 1

## 2019-07-11 MED ORDER — CALCIUM GLUCONATE-NACL 1-0.675 GM/50ML-% IV SOLN
1.0000 g | Freq: Once | INTRAVENOUS | Status: AC
  Administered 2019-07-11: 1000 mg via INTRAVENOUS
  Filled 2019-07-11: qty 50

## 2019-07-11 SURGICAL SUPPLY — 21 items
BALLN SAPPHIRE 2.5X15 (BALLOONS) ×2
BALLN SAPPHIRE ~~LOC~~ 3.75X10 (BALLOONS) ×1 IMPLANT
BALLOON SAPPHIRE 2.5X15 (BALLOONS) IMPLANT
CATH 5FR JL3.5 JR4 ANG PIG MP (CATHETERS) ×1 IMPLANT
CATH SWAN GANZ VIP 7.5F (CATHETERS) ×1 IMPLANT
CATH VISTA GUIDE 6FR XBLAD3.5 (CATHETERS) ×1 IMPLANT
DEVICE RAD COMP TR BAND LRG (VASCULAR PRODUCTS) ×2 IMPLANT
ELECT DEFIB PAD ADLT CADENCE (PAD) ×1 IMPLANT
GLIDESHEATH SLEND SS 6F .021 (SHEATH) ×1 IMPLANT
GUIDEWIRE INQWIRE 1.5J.035X260 (WIRE) IMPLANT
INQWIRE 1.5J .035X260CM (WIRE) ×2
KIT ENCORE 26 ADVANTAGE (KITS) ×1 IMPLANT
KIT HEART LEFT (KITS) ×2 IMPLANT
PACK CARDIAC CATHETERIZATION (CUSTOM PROCEDURE TRAY) ×2 IMPLANT
SHEATH PINNACLE 8F 10CM (SHEATH) ×1 IMPLANT
SLEEVE REPOSITIONING LENGTH 30 (MISCELLANEOUS) ×1 IMPLANT
STENT RESOLUTE ONYX 3.5X18 (Permanent Stent) ×1 IMPLANT
TRANSDUCER W/STOPCOCK (MISCELLANEOUS) ×2 IMPLANT
TUBING CIL FLEX 10 FLL-RA (TUBING) ×2 IMPLANT
WIRE ASAHI PROWATER 180CM (WIRE) ×1 IMPLANT
WIRE EMERALD 3MM-J .025X260CM (WIRE) ×1 IMPLANT

## 2019-07-11 NOTE — Progress Notes (Signed)
RT met pt and EMS in cath lab with pt and placed him on the vent.  RT stayed with pt until procedure was complete.  RT and RN,  with transport, took pt to CT and brought pt to 0N35 without complications.  2H RT given report by this RT.

## 2019-07-11 NOTE — Progress Notes (Signed)
eLink Physician-Brief Progress Note Patient Name: Edward Mendoza DOB: [birth year] MRN: [id number]   Date of Service  07/11/2019  HPI/Events of Note  Hypotension - BP = 90/72 with MAP = 78. CVP = 6 and Hgb = 14.6 LVEF = 15%.   eICU Interventions  Will order: 1. Bollus with 0.9 NaCl 500 mL IV over 30 minutes now. 2. When CVP > = 10, obtain COOX.     Intervention Category Major Interventions: Hypotension - evaluation and management  Olympia Adelsberger Eugene 07/11/2019, 10:44 PM

## 2019-07-11 NOTE — Progress Notes (Addendum)
Progress Note  Patient Name: Edward Mendoza Date of Encounter: 07/11/2019  Primary Cardiologist: new  Subjective   Sedated on vent; hypothermia protocol, current tem 36 C  Inpatient Medications    Scheduled Meds: . aspirin  81 mg Per Tube Daily  . aspirin  300 mg Rectal NOW  . atorvastatin  80 mg Per Tube q1800  . chlorhexidine gluconate (MEDLINE KIT)  15 mL Mouth Rinse BID  . Chlorhexidine Gluconate Cloth  6 each Topical Daily  . heparin  5,000 Units Subcutaneous Q8H  . insulin aspart  0-9 Units Subcutaneous Q4H  . mouth rinse  15 mL Mouth Rinse 10 times per day  . pantoprazole sodium  40 mg Per Tube Daily  . sodium chloride flush  10-40 mL Intracatheter Q12H  . sodium chloride flush  3 mL Intravenous Q12H  . ticagrelor  90 mg Per Tube BID   Continuous Infusions: . sodium chloride Stopped (07/11/19 0649)  . sodium chloride 75 mL/hr at 07/11/19 0620  . sodium chloride    . amiodarone 60 mg/hr (07/11/19 0800)  . amiodarone    . calcium gluconate    . fentaNYL infusion INTRAVENOUS 50 mcg/hr (07/11/19 0515)  . levETIRAcetam    . norepinephrine (LEVOPHED) Adult infusion 24 mcg/min (07/11/19 0650)  . potassium chloride    . propofol (DIPRIVAN) infusion 35 mcg/kg/min (07/11/19 0801)   PRN Meds: sodium chloride, acetaminophen, ondansetron (ZOFRAN) IV, sodium chloride flush, sodium chloride flush   Vital Signs    Vitals:   07/11/19 0700 07/11/19 0705 07/11/19 0715 07/11/19 0800  BP: 116/87  97/85 106/71  Pulse:      Resp: (!) 24  (!) 24 (!) 24  Temp:  (!) 94.3 F (34.6 C)  (!) 96.8 F (36 C)  TempSrc:  Core  Core (Comment)  SpO2:    100%  Weight:      Height:        Intake/Output Summary (Last 24 hours) at 07/11/2019 0857 Last data filed at 07/11/2019 0801 Gross per 24 hour  Intake 1466.38 ml  Output 45 ml  Net 1421.38 ml    I/O since admission: [place]   07/11/19 0227  Weight: 113 kg    Telemetry    Sinus tachcardia at 106;  He had  develped AF earlier after arrival to unit, now on amiodarone- Personally Reviewed  ECG    We will repeat ECG now since patient is back in sinus rhythm  ECG (independently read by me): Atrial Fibrillation at 116; QS V1-4; STE V1-4; QTc 453 msec  Physical Exam   BP 106/71 (BP Location: Right Leg)   Pulse (!) 32   Temp (!) 96.8 F (36 C) (Core (Comment))   Resp (!) 24   Ht '6\' 2"'$  (1.88 m)   Wt 113 kg   SpO2 100%   BMI 31.99 kg/m  General: sedated on vent  Skin: normal turgor, no rashes, warm and dry HEENT: Normocephalic, atraumatic. Pupils equal round and reactive to light; sclera anicteric; extraocular muscles intact;  Nose without nasal septal hypertrophy Mouth/Parynx: intubated  Neck: No JVD, no carotid bruits; normal carotid upstroke Lungs: clear to ausculatation and percussion; no wheezing or rales Chest wall: without tenderness to palpitation Heart: PMI not displaced, RRR, s1 s2 normal, 1/6 systolic murmur, no diastolic murmur, no rubs, gallops, thrills, or heaves Abdomen: soft, nontender; no hepatosplenomehaly, BS+; abdominal aorta nontender and not dilated by palpation. Back: no CVA tenderness Pulses 2+ Sheath in RFV  with swan;  R radial site stable Musculoskeletal: full range of motion, normal strength, no joint deformities Extremities: no clubbing cyanosis or edema, Homan's sign negative  Neurologic: grossly nonfocal; Cranial nerves grossly wnl  Labs    Chemistry Recent Labs  Lab 07/11/19 0149 07/11/19 0149 07/11/19 0238 07/11/19 0533 07/11/19 0717  NA 135   < > 135 139 138  K 4.4   < > 4.5 3.3* 3.6  CL 104  --   --  108 109  CO2 17*  --   --  20*  --   GLUCOSE 249*  --   --  166* 148*  BUN 21*  --   --  23* 25*  CREATININE 1.41*  --   --  1.18 1.00  CALCIUM 8.4*  --   --  7.6*  --   PROT 6.3*  --   --  5.5*  --   ALBUMIN 3.6  --   --  3.2*  --   AST 483*  --   --  851*  --   ALT 142*  --   --  197*  --   ALKPHOS 87  --   --  69  --   BILITOT 0.9  --    --  0.9  --   GFRNONAA 59*  --   --  >60  --   GFRAA >60  --   --  >60  --   ANIONGAP 14  --   --  11  --    < > = values in this interval not displayed.     Hematology Recent Labs  Lab 07/11/19 0149 07/11/19 0149 07/11/19 0238 07/11/19 0533 07/11/19 0717  WBC 27.3*  --   --  18.8*  --   RBC 5.02  --   --  5.04  --   HGB 15.6   < > 15.6 15.5 14.6  HCT 47.6   < > 46.0 46.3 43.0  MCV 94.8  --   --  91.9  --   MCH 31.1  --   --  30.8  --   MCHC 32.8  --   --  33.5  --   RDW 11.9  --   --  12.0  --   PLT 269  --   --  255  --    < > = values in this interval not displayed.    Cardiac EnzymesNo results for input(s): TROPONINI in the last 168 hours. No results for input(s): TROPIPOC in the last 168 hours.   BNP Recent Labs  Lab 07/11/19 0533  BNP 23.3     DDimer No results for input(s): DDIMER in the last 168 hours.   Lipid Panel     Component Value Date/Time   CHOL 226 (H) 07/11/2019 0307   TRIG 298 (H) 07/11/2019 0307   HDL 34 (L) 07/11/2019 0307   CHOLHDL 6.6 07/11/2019 0307   VLDL 60 (H) 07/11/2019 0307   LDLCALC 132 (H) 07/11/2019 0307     Radiology    CT HEAD WO CONTRAST  Result Date: 07/11/2019 CLINICAL DATA:  Seizure activity.  Encephalopathy. EXAM: CT HEAD WITHOUT CONTRAST TECHNIQUE: Contiguous axial images were obtained from the base of the skull through the vertex without intravenous contrast. COMPARISON:  None. FINDINGS: Brain: The ventricles are in the midline without mass effect or shift. They are normal in size and configuration. No extra-axial fluid collections are identified. Contrast noted from prior cardiac catheterization. Evidence of  a right cerebellar infarct of uncertain age but probably remote or subacute. It does not appear acute. No other abnormalities are identified. Vascular: The major vascular structures are unremarkable. No aneurysm. Skull: No skull fracture or bone lesions. Sinuses/Orbits: Moderate mucoperiosteal thickening and partial  opacification of the ethmoid air cells and sphenoid sinus. The mastoid air cells and middle ear cavities are clear. The globes are intact. Other: No scalp lesions or hematoma. IMPRESSION: 1. Contrast is present and likely from cardiac catheterization. 2. Remote or subacute right cerebellar infarct. 3. No other significant intracranial findings. 4. Paranasal sinus disease. Electronically Signed   By: Marijo Sanes M.D.   On: 07/11/2019 05:30   CARDIAC CATHETERIZATION  Result Date: 07/11/2019  There is moderate left ventricular systolic dysfunction.  LV end diastolic pressure is moderately elevated.  The left ventricular ejection fraction is 35-45% by visual estimate.  There is no aortic valve stenosis.  Prox LAD to Mid LAD lesion is 95% stenosed.  Post intervention, there is a 0% residual stenosis.  A drug-eluting stent was successfully placed using a STENT RESOLUTE ONYX 3.5X18.  SUMMARY  Anterior STEMI complicated by VF arrest.   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  Otherwise minimal disease elsewhere.  Successful DES PCI of proximal LAD using resolute Onyx DES 3.5 x 18 mm postdilated to 4.0 mm.  Systolic hypotension with borderline cardiogenic shock with systolic pressures in the 90s over 60s requiring Levophed at 10 mcg/min. -->  Cardiac output-index 4.77-2.04; cardiac power 0.8.  No evidence of pulmonary hypertension RECOMMENDATIONS  Concern for possible seizure activity following PCI. ->  Discussed with PCCM, will send for head CT following cath and have neurology consult  We will continue IV Kengreal until OG tube placed and able to receive Brilinta.  Have discussed with critical care & Dr. Haroldine Laws ([facility]), plan to avoid hypothermia protocol given significant movement upon arrival.  Appreciate PCCM assistance with sedation  No blood pressure room currently for GDMT with beta-blocker.  Currently on Levophed-wean as tolerated.   Obtain 2D echocardiogram Glenetta Hew, M.D., M.S. Interventional Cardiologist [address]. [address] [place], [place] [address]  DG Chest Port 1 View  Result Date: 07/11/2019 CLINICAL DATA:  Intubation EXAM: PORTABLE CHEST 1 VIEW COMPARISON:  None. FINDINGS: Endotracheal tube tip is at the clavicular heads. The orogastric tube reaches the stomach. Swan-Ganz catheter from below with tip at the right main pulmonary artery. Tubing traversing the left neck and mediastinum, appearance more suggestive of external location than IJ line. No chart indication of line placement. Low volume but clear lungs. Normal heart size. IMPRESSION: 1. Unremarkable endotracheal and orogastric tubes. 2. Swan-Ganz catheter from below with tip at the right main pulmonary artery. 3. No pulmonary edema. Electronically Signed   By: Monte Fantasia M.D.   On: 07/11/2019 06:10    Cardiac Studies    There is moderate left ventricular systolic dysfunction.  LV end diastolic pressure is moderately elevated.  The left ventricular ejection fraction is 35-45% by visual estimate.  There is no aortic valve stenosis.  Prox LAD to Mid LAD lesion is 95% stenosed.  Post intervention, there is a 0% residual stenosis.  A drug-eluting stent was successfully placed using a STENT RESOLUTE ONYX 3.5X18.   SUMMARY  Anterior STEMI complicated by VF arrest.    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  Otherwise minimal disease elsewhere.  Successful DES PCI of proximal LAD using resolute Onyx  DES 3.5 x 18 mm postdilated to 4.0 mm.  Systolic hypotension with borderline cardiogenic shock with systolic pressures in the 90s over 60s requiring Levophed at 10 mcg/min. -->  Cardiac output-index 4.77-2.04; cardiac power 0.8.  No evidence of pulmonary hypertension  RECOMMENDATIONS  Concern for possible seizure activity following PCI. ->  Discussed with PCCM, will send for head CT following  cath and have neurology consult  We will continue IV Kengreal until OG tube placed and able to receive Brilinta.  Have discussed with critical care & Dr. Haroldine Laws ([facility]), plan to avoid hypothermia protocol given significant movement upon arrival.  Appreciate PCCM assistance with sedation  No blood pressure room currently for GDMT with beta-blocker.  Currently on Levophed-wean as tolerated.  Obtain 2D echocardiogram    Glenetta Hew, M.D., M.S. Interventional Cardiologist    [address] [place], [place] [address]    Recommendations  Antiplatelet/Anticoag Recommend uninterrupted dual antiplatelet therapy with Aspirin '81mg'$  daily and Ticagrelor '90mg'$  twice daily for a minimum of 12 months (ACS-Class I recommendation).  Indications  Cardiac arrest with ventricular fibrillation (HCC) [I46.9, I49.01 (ICD-10-CM)]  Acute ST elevation myocardial infarction (STEMI) involving left anterior descending (LAD) coronary artery (HCC) [I21.02 (ICD-10-CM)]  Cardiogenic shock ([place]) [R57.0 (ICD-10-CM)]  Procedural Details  Technical Details PCP: Mateo Flow, MD Cardiologist: New to [facility]  Edward Mendoza is a 49 y.o. male with no prior medical history who presented with acute STEMI complicated by witnessed VF arrest.  Patient had been complaining of 3 days of chest pain.  Finally catheter EMS.  Upon their arrival.  On monitor was noted to have a short run of VT.  This resolved spontaneously, but then when he was placed on pads, decompensated to ventricular fibrillation.  Had a total of 6 rounds of CPR with shocks, IV lidocaine and epinephrine administered.  Was continued on CPR using the mechanical device.  Upon arrival to [place] airway was exchanged for an ETT, and the patient was noted to be in VF that was successfully converted to [place] with 1 shock after intubation.  EKG done pre and post defibrillation showed dramatic 8 to 10 mm ST  elevations in anterior leads.  With witnessed arrest and high-quality CPR, despite 45 minutes, decision was made to call code STEMI patient was transported emergency traffic from [facility] to [facility] where he was brought emergently to the cardiac Cath Lab.  Time Out: Verified patient identification, verified procedure, site/side was marked, verified correct patient position, special equipment/implants available, medications/allergies/relevent history reviewed, required imaging and test results available. Performed.  ACCESS:  Right Radial Artery: 6 Fr sheath -- Seldinger technique using Micropuncture Kit Direct ultrasound guidance used.  Permanent image obtained and placed on chart. 10 mL radial cocktail IA; 8000 Units IV Heparin   LEFT HEART CATHETERIZATION: 5Fr Catheters advanced or exchanged over a J-wire under direct fluoroscopic guidance into the ascending aorta; TIG 4.0 catheter advanced first.  * LV Hemodynamics (LV Gram): TIG 4.0 * Left  & Right Coronary Artery Cineangiography: TIG 4.0 catheter   Coronary angiography revealed severe proximal LAD lesion.  Preparations made for PCI.  With no G-tube, we used IV at cangrelor along with additional bolus of IV heparin.  PCI LAD performed: See FINDINGS.  Upon completion of Angiogaphy, the catheter was removed completely out of the body over a wire, without complication.  The patient continued to have borderline blood pressures requiring titration of Levophed from 5 to 10 mcg/min.  Therefore decided to proceed with right heart catheterization to determine extent of shock.  I did discuss this with Dr. Haroldine Laws on the telephone.  * RIGHT Common Femoral Vein: 8 Fr Sheath - Seldinger Technique. Direct ultrasound guidance used.  Permanent image obtained and placed on chart.  RIGHT HEART CATHETERIZATION: 7.5 Fr Gordy Councilman catheter advanced under fluoroscopy with balloon inflated to the RA, RV, then PCWP-PA for hemodynamic  measurement.  0.25 wire was used to advance the catheter into the PA * Simultaneous FA & PA blood gases checked for SaO2% to calculate FICK CO/CI  * Sheath was sutured in place with catheter cover in place.  Catheter was 80 cm.  Post PCI - Left Artery: Arterial Line Angiocath insertion-- Seldinger technique using Micropuncture Kit Direct ultrasound guidance used.   Radial sheath removed in the Cardiac Catheterization lab with TR Band placed for hemostasis. During preparation to remove the patient from the Cath Lab table, it became obvious that the left radial arterial line was developing a hematoma with significant bleeding due to IV Cangrelor.  The decision was made to remove this line and also place a TR band.  Right radial TR Band: 3:50 AM; 12 mL air; second TR band was placed at roughly 5 AM -mL air  MEDICATIONS * SQ Lidocaine 5m * Radial Cocktail: 3 mg Verapmil in 10 mL NS * Isovue Contrast: Total 120 mL * Heparin: Total 11,000 Units * IV Cangrelor bolus and drip *IV Levophed started at 5 mcg/then, increased to 10 mcg/min *In addition to boluses of IV Versed and fentanyl, the patient was started on fentanyl and Versed drips (50 mcg/h and 4 mg/h respectively) followed by propofol infusion. *Per PCCM-rocuronium 100 mg IV given  Estimated blood loss <50 mL.   During this procedure medications were administered to achieve and maintain moderate conscious sedation while the patient's heart rate, blood pressure, and oxygen saturation were continuously monitored and I was present face-to-face 100% of this time.    Intervention     Patient Profile     49y.o. male who suffered an out of hospital VF cardiac arrest treated with CPR for 40 minutes[place].  Assessment & Plan    1. Out of hospital VF cardiac arrest: Prolonged out of hospital CPR cardiac 40 minutes of CPR with subsequent ECG showing  STEMI.  Status post PCI to proximal LAD.  Other  coronaries are normal.  Currently on hypothermia protocol with target temperature 36.  On aspirin/Brilinta antiplatelet therapy.  Will obtain 2D echo Doppler study.  2.  Cardiogenic shock: Required Levophed.  Now being successfully weaned from 242down to 2 with stable blood pressure. PAD currently 12 - 14 mm Hg.   3.  Post MI atrial fibrillation: Now on amiodarone and back in sinus rhythm with sinus tachycardia at 104.  Will repeat EKG  4.  Intubated on vent: Followed by critical care team  5.  Elevated L LFTs: Probably contributed by shock liver/MI.  AST/ALT, 851/197.  We will hold off atorvastatin for now and reinstitute when LFTs improve.  6.  Leukocytosis: White blood count increased to 27,000, Unasyn initiated.  7.  Mixed hyperlipidemia with LDL 132 ; triglycerides 298.  Will reinitiate atorvastatin once LFTs improved  8.  Hypokalemia: 3.3, improved to 3.6  Critical care time: 40 minutes  Signed, TTroy Sine MD, [facility]3/02/2020, 8:57 AM

## 2019-07-11 NOTE — Procedures (Addendum)
Patient Name: Edward Mendoza  MRN: [id number]  Epilepsy Attending: Charlsie Quest  Referring Physician/Provider: Dr Bryan Lemma Date: 07/11/2019  Duration: 22.30 mins  Patient history: 49yo M who presented with cardiac arrest on TTM. EEG to evaluate for seizure  Level of alertness: comatose  AEDs during EEG study: LEV, propofol  Technical aspects: This EEG study was done with scalp electrodes positioned according to the 10-20 International system of electrode placement. Electrical activity was acquired at a sampling rate of 500Hz  and reviewed with a high frequency filter of 70Hz  and a low frequency filter of 1Hz . EEG data were recorded continuously and digitally stored.   DESCRIPTION: EEG showed generalized 13-15Hz  sharply contoured beta activity admixed with polymorphic 6-7hz  theta activity. Brief periods of generalized eeg attenuation lasting 2-3 seconds was also noted. EEG was not reactive to tactile stimulation. Hyperventilation and photic stimulation were not performed.  ABNORMALITY - Continuous slow, generalized - Excessive beta, generalized - Background attenuation, generalized  IMPRESSION: This study is suggestive of profound diffuse encephalopathy, non specific to etiology. No seizures or epileptiform discharges were seen throughout the recording.    Cleone Hulick 

## 2019-07-11 NOTE — Interval H&P Note (Signed)
History and Physical Interval Note:  07/11/2019 1:44 AM  Edward Mendoza  has presented today for surgery, with the diagnosis of STEMI.  The various methods of treatment have been discussed with the patient and family. After consideration of risks, benefits and other options for treatment, the patient has consented to  Procedure(s): Coronary/Graft Acute MI Revascularization (N/A) RIGHT/LEFT HEART CATH AND CORONARY ANGIOGRAPHY (N/A) ARTERIAL LINE INSERTION (N/A) CORONARY STENT INTERVENTION (N/A) as a surgical intervention.  The patient's history has been reviewed, patient examined, no change in status, stable for surgery.  I have reviewed the patient's chart and labs.  Questions were answered to the patient's satisfaction.     Bryan Lemma

## 2019-07-11 NOTE — H&P (Signed)
Cardiology Admission History and Physical:   Patient ID: Edward Mendoza MRN: [id number]; DOB: 1971-04-12   Admission date: 07/11/2019  Primary Care Provider: Mateo Flow, MD Primary Cardiologist: No primary care provider on file.  Primary Electrophysiologist:  None   Chief Complaint:  Chest pain / cardiac arrest  Patient Profile:   Edward Mendoza is a 49 y.o. male with no prior medical history who presented with acute STEMI complicated by witnessed VF arrest.   History of Present Illness:   Edward Mendoza reported to EMS providers that he had developed stuttering chest pain for the last several days. When the team arrived, he was alert and interactive. He subsequently suffered witnessed VF arrest with immediate resuscitation. He received 6 total external defibrillation and 2 mg epi and had emergency airway placed. He had a total of 45-50 minutes of resuscitation resulting in Casa shortly after arrival in the ED. Post-ROSC ECG demonstrated anterior STEMI and he was transferred emergently to [facility].   He is mechanically ventilated with no purposeful interaction on arrival.   Coronary angiography demonstrated 99% proximal-LAD occlusion. He received PCI with 3.5 x 18 Onyx DES. Right heart cath demonstrated mildly elevated filling pressures (RA 12, PCW 23) and preserve cardiac output with MVO2 67%.   Heart Pathway Score:     History reviewed. No pertinent past medical history.  Medications Prior to Admission: Prior to Admission medications   Not on File    Allergies:   Not on File  Social History:   Social History   Socioeconomic History  . Marital status: Married    Spouse name: Not on file  . Number of children: Not on file  . Years of education: Not on file  . Highest education level: Not on file  Occupational History  . Not on file  Tobacco Use  . Smoking status: Not on file  Substance and Sexual Activity  . Alcohol use: Not on file  . Drug use: Not on file  .  Sexual activity: Not on file  Other Topics Concern  . Not on file  Social History Narrative  . Not on file   Social Determinants of Health   Financial Resource Strain:   . Difficulty of Paying Living Expenses: Not on file  Food Insecurity:   . Worried About Charity fundraiser in the Last Year: Not on file  . Ran Out of Food in the Last Year: Not on file  Transportation Needs:   . Lack of Transportation (Medical): Not on file  . Lack of Transportation (Non-Medical): Not on file  Physical Activity:   . Days of Exercise per Week: Not on file  . Minutes of Exercise per Session: Not on file  Stress:   . Feeling of Stress : Not on file  Social Connections:   . Frequency of Communication with Friends and Family: Not on file  . Frequency of Social Gatherings with Friends and Family: Not on file  . Attends Religious Services: Not on file  . Active Member of Clubs or Organizations: Not on file  . Attends Archivist Meetings: Not on file  . Marital Status: Not on file  Intimate Partner Violence:   . Fear of Current or Ex-Partner: Not on file  . Emotionally Abused: Not on file  . Physically Abused: Not on file  . Sexually Abused: Not on file    Family History:  Unable to obtain The patient's family history is not on file.  ROS:  Please see the history of present illness.   Physical Exam/Data:   Vitals:   07/11/19 0142 07/11/19 0227  SpO2: 100%   Weight:  113 kg   No intake or output data in the 24 hours ending 07/11/19 0158 No flowsheet data found.   There is no height or weight on file to calculate BMI.  General: Intubated, sedated, intermittently agitated and biting the ETT HEENT: normal Lymph: no adenopathy Neck: no JVD Endocrine:  No thryomegaly Vascular: No carotid bruits; FA pulses 2+ bilaterally without bruits  Cardiac:  normal S1, S2; RRR; no murmur  Lungs:  Coarse mechanical breath sounds Abd: soft, nontender, no hepatomegaly  Ext: trace LE  edema Musculoskeletal:  No deformities Skin: warm and dry  Neuro:  Fine tremor of bilateral upper extremities. Moving all extremities, non-purposeful movement. No response to voice.  Psych:  Normal affect   EKG:  The ECG that was done 07/11/2019 at 00:22 was personally reviewed and demonstrates sinus rhythm with anterior and lateral ST elevation and reciprocal inferior ST depressions.   Relevant CV Studies:   Laboratory Data:  High Sensitivity Troponin:  No results for input(s): TROPONINIHS in the last 720 hours.    ChemistryNo results for input(s): NA, K, CL, CO2, GLUCOSE, BUN, CREATININE, CALCIUM, GFRNONAA, GFRAA, ANIONGAP in the last 168 hours.  No results for input(s): PROT, ALBUMIN, AST, ALT, ALKPHOS, BILITOT in the last 168 hours. HematologyNo results for input(s): WBC, RBC, HGB, HCT, MCV, MCH, MCHC, RDW, PLT in the last 168 hours. BNPNo results for input(s): BNP, PROBNP in the last 168 hours.  DDimer No results for input(s): DDIMER in the last 168 hours.  Radiology/Studies:  No results found.  TIMI Risk Score for ST  Elevation MI:   The patient's TIMI risk score is 7, which indicates a 23.4% risk of all cause mortality at 30 days.    Assessment and Plan:   1. Anterior STEMI  - DAPT: Aspirin 81 mg daily and IV cangrelor - Transition to ticagrelor after enteral access is obtained and continue oral DAPT for at least 12 months followed by aspirin lifelong - Atorvastatin 40 mg daily  - Beta blocker: avoid in the short term given acute myocardial dysfunction at risk for cardiogenic shock.  - Norepinephrine for hypotension, improving with correction of acute metabolic acidosis  - Echo in AM - check lipid panel, A1c, HIV   2. S/p VF arrest - defer antiarrhythmics given reversible underlying etiology - CT head given myoclonic activity  - targeted temperature management for target <36 degrees celsius    3. Need for mechanical ventilation   - sedation with fentanyl and propofol,  required paralysis in cath lab due to agitation  - consider starting enteral nutrition this morning   - aim for spontaneous breathing trial after rewarming    Severity of Illness: The appropriate patient status for this patient is INPATIENT. Inpatient status is judged to be reasonable and necessary in order to provide the required intensity of service to ensure the patient's safety. The patient's presenting symptoms, physical exam findings, and initial radiographic and laboratory data in the context of their chronic comorbidities is felt to place them at high risk for further clinical deterioration. Furthermore, it is not anticipated that the patient will be medically stable for discharge from the hospital within 2 midnights of admission. The following factors support the patient status of inpatient.   " The patient's presenting symptoms include cardiac arrest. " The worrisome physical exam findings  include minimally responsive. " The initial radiographic and laboratory data are worrisome because of VF, ST-elevation MI. " The chronic co-morbidities include none.  * I certify that at the point of admission it is my clinical judgment that the patient will require inpatient hospital care spanning beyond 2 midnights from the point of admission due to high intensity of service, high risk for further deterioration and high frequency of surveillance required.*  For questions or updates, please contact [facility] Please consult [email or web address] for contact info under   Signed, Cynda Acres, MD  07/11/2019 1:58 AM

## 2019-07-11 NOTE — Progress Notes (Signed)
EEG complete - results pending 

## 2019-07-11 NOTE — Progress Notes (Addendum)
NAME:  Edward Mendoza, MRN:  [id number], DOB:  03/10/1971, LOS: 0 ADMISSION DATE:  07/11/2019, CONSULTATION DATE:   REFERRING MD:  Dr. Launa Grill , CHIEF COMPLAINT:  Cardiac arrest  Brief History   This is a 49 year old male who presents sp cardaic arrest from V fib and V tach noted to have anterior STEMI. SP cath  History of present illness   This is a 49 yo male with no prior medical history who presents with intermittent chest pain since Saturday. He called EMS. They arrived. Shortly after EMS arrived to patient he went into witnessed VF arrest and resuscitation ensued immediately. He received 6 defibrillations and 2 mg of epi. He had a total of 45-50 minutes of compressions. ROSC was achieved shortly after patient arrived to the ED. After ROSC achieved EKG noted anterior STEMI. Patient transferred here for intervention. In our cath lab patient noted to proximal LAD occlusion. He received PCI  To LAD. Right heart cath was also performed that noted the following pressures. No fevers chills or signs of illness recently.   Coronary angiography demonstrated 99% proximal-LAD occlusion. He received PCI with 3.5 x 18 Onyx DES. Right heart cath demonstrated mildly elevated filling pressures (RA 12, PCW 23) and preserve cardiac output with MVO2 67%.   Very active at baseline. Current PPD smoker currently.   Past Medical History  None per chart review unable to reach the wife to confirm  [place] [facility] Events   Cath performed with stent placement to LAD  Consults:  PCCM  Procedures:  Swan 3/10 Inutabtion 07/11/19 PCI 07/11/19  Significant Diagnostic Tests:   CT head  07/11/19> R cerebellar infarct which appears remote or subacute in nature.   Micro Data:  Covid negative on 07/11/19  Antimicrobials:  None  Interim history/subjective:  TTM to 36  In shock, remains on moderate dose NE  Variable sedation doses overnight, RASS consistently documented as -5   I have decreased FiO2 from 100%  to 40% We are actively weaning pressors and sedation this morning  Objective   Blood pressure 97/85, pulse (!) 32, temperature (!) 94.3 F (34.6 C), temperature source Core, resp. rate (!) 24, height 6\' 2"  (1.88 m), weight 113 kg, SpO2 (!) 87 %. PAP: (28-34)/(14-17) 28/15 CVP:  [5 mmHg-17 mmHg] 5 mmHg  Vent Mode: PRVC FiO2 (%):  [100 %] 100 % Set Rate:  [20 bmp-28 bmp] 24 bmp Vt Set:  [550 mL-650 mL] 650 mL PEEP:  [5 cmH20] 5 cmH20 Plateau Pressure:  [18 cmH20] 18 cmH20   Intake/Output Summary (Last 24 hours) at 07/11/2019 0803 Last data filed at 07/11/2019 0700 Gross per 24 hour  Intake 1146.05 ml  Output --  Net 1146.05 ml   Filed Weights   07/11/19 0227  Weight: 113 kg    Examination: General: WDWN  Middle aged M, sedated intubated, critically ill appearing  HENT: NCAT. Scant amount of dry blood. ETT secure. Anicteric sclera. Trachea midline. Pink mmm  Lungs: CTA bilaterally. Symmetrical chest expansion, mechanical vent sounds.  Cardiovascular: RRR with intermittent PVCs on monitor. No JVD.  Abdomen: Soft flat ndnt. + bowel sounds  Extremities: Symmetrical bulk and tone, no obvious joint deformity. Extremities are cool to touch distally.  Neuro: Sedated RASS -5 Propofol, Fentanyl. 31mm pupils bilaterally.  GU: WNL  Skin: External cooling pads in place Visible skin surfaces are c/d/without rash  Resolved Hospital Problem list   NA  Assessment & Plan:   VT/ VF cardiac arrest 2/2  anterior STEMI s/p PCI w/ pLAD DES  -downtime > 30 minutes Atrial fibrillation, intermittent  Elevated APTT -likely r/t cath lab procedure? Cardiogenic shock  - EF estimated ~45% P:  Cardiology primary  NE for MAP goal >  65, weaning   Trend coox/ CVP  ASA, brillinta per cards  Amio gtt  Continue ICU monitoring   Acute encephalopathy post cardiac arrest - questionable some purposeful movement in cath lab and noted clenching and shivering vs ?seizure activity -CT H with evidence of R  cerebellar infarct which does not appear acute  ? Seizure activity  -s/p keppra load  -with prolonged downtime is certainly at risk for anoxic injury  P:  EEG ordered Bid keppra Seizure precautions  Wean fentanyl and prop as able    Acute respiratory failure in setting of cardiac arrest  -arrives with ETT from OSH which is very malleable, intermittently folding/kinking causing high ppressure  P:  Wean vent support as able. 3/10 am decreasing FiO2 from 100% to 40%. Repeat ABG to follow later this morning  RT has changed tube holder for greater ETT stability. If this is not helpful, may need to exchange ETT VAP PAD protocol with propofol/ fentanyl gtt for RASS goal   AKI, improving  -liekly in setting of hypoperfusion  Mixed respiratory / metabolic NGMA P:  Continue foley  Check mag  Strict I/Os, daily weights  BMP q12  Hypocalcemia iCal 1.09 Hypokalemia P giving calcium 3/10 Giving K 3/10 Trend with BMP  Elevated LFTs - likely in setting of hypoperfusion  P:  LFTs, coags PRN   Hyperglycemia  P:  CBG q 4 with SSI sensitive  Leukocytosis  - likely reactive. PCT reassuring  P:  Trend WBC, fever curve   Inadequate PO intake -intubated P EN per RDN   Best practice:  Diet: EN per RDN  Pain/Anxiety/Delirium protocol (if indicated): propofol/ fentanyl, RASS goal -1 to -2 VAP protocol (if indicated): yes DVT prophylaxis: heparin SQ GI prophylaxis: PPI Glucose control: SSI sensitive  Mobility: BR Code Status: Full  Family Communication: attempted, no answer  Disposition:  2H ICU  Labs   CBC: Recent Labs  Lab 07/11/19 0148 07/11/19 0149 07/11/19 0238 07/11/19 0533 07/11/19 0717  WBC  --  27.3*  --  18.8*  --   NEUTROABS  --   --   --  15.4*  --   HGB 16.0  16.0 15.6 15.6 15.5 14.6  HCT 47.0  47.0 47.6 46.0 46.3 43.0  MCV  --  94.8  --  91.9  --   PLT  --  269  --  255  --     Basic Metabolic Panel: Recent Labs  Lab 07/11/19 0148  07/11/19 0149 07/11/19 0238 07/11/19 0533 07/11/19 0717  NA 136  136 135 135 139 138  K 4.4  4.4 4.4 4.5 3.3* 3.6  CL 105 104  --  108 109  CO2  --  17*  --  20*  --   GLUCOSE 244* 249*  --  166* 148*  BUN 23* 21*  --  23* 25*  CREATININE 1.30* 1.41*  --  1.18 1.00  CALCIUM  --  8.4*  --  7.6*  --   MG  --   --   --  2.6*  --   PHOS  --   --   --  2.6  --    GFR: Estimated Creatinine Clearance: 123.4 mL/min (by C-G formula based on SCr of  1 mg/dL). Recent Labs  Lab 07/11/19 0149 07/11/19 0533 07/11/19 0538  PROCALCITON  --  0.80  --   WBC 27.3* 18.8*  --   LATICACIDVEN  --   --  1.8    Liver Function Tests: Recent Labs  Lab 07/11/19 0149 07/11/19 0533  AST 483* 851*  ALT 142* 197*  ALKPHOS 87 69  BILITOT 0.9 0.9  PROT 6.3* 5.5*  ALBUMIN 3.6 3.2*   No results for input(s): LIPASE, AMYLASE in the last 168 hours. No results for input(s): AMMONIA in the last 168 hours.  ABG    Component Value Date/Time   PHART 7.229 (L) 07/11/2019 0238   PCO2ART 46.2 07/11/2019 0238   PO2ART 164.0 (H) 07/11/2019 0238   HCO3 19.3 (L) 07/11/2019 0238   TCO2 21 (L) 07/11/2019 0717   ACIDBASEDEF 8.0 (H) 07/11/2019 0238   O2SAT 99.0 07/11/2019 0238     Coagulation Profile: Recent Labs  Lab 07/11/19 0149 07/11/19 0538  INR 1.1 1.2    Cardiac Enzymes: No results for input(s): CKTOTAL, CKMB, CKMBINDEX, TROPONINI in the last 168 hours.  HbA1C: Hgb A1c MFr Bld  Date/Time Value Ref Range Status  07/11/2019 03:07 AM 5.4 4.8 - 5.6 % Final    Comment:    (NOTE) Pre diabetes:          5.7%-6.4% Diabetes:              >6.4% Glycemic control for   <7.0% adults with diabetes     CBG: Recent Labs  Lab 07/11/19 0636  GLUCAP 110*     Critical care time: 48  minutes    Tessie Fass MSN, AGACNP-BC [facility] [phone] If no answer, [phone] 07/11/2019, 9:06 AM   PCCM attending:  49 year old gentleman ventricular  tachycardia/ventricular fibrillation cardiac arrest secondary to anterior STEMI status post PCI to the LAD with DES developed cardiogenic shock.  Patient placed on targeted temperature management protocol.  Concern for anoxic brain injury and neurologic preservation.  Patient had approximately 40-minute downtime.  Patient remains in the intensive care unit critically ill on mechanical life support TTM management.  BP 101/61   Pulse 100   Temp 97.9 F (36.6 C) (Core)   Resp (!) 24   Ht 6\' 2"  (1.88 m)   Wt 113 kg   SpO2 100%   BMI 31.99 kg/m   General: Young male intubated mechanical life support TTM HEENT: Pupils reactive, does have cough, endotracheal tube in place Chest: Pads in place, regular rate rhythm S1-S2 Lungs: Bilateral mechanically ventilated breath sounds Extremities: Normal bulk and tone, no significant edema  Labs: Reviewed Chest x-ray: Swan-Ganz in place, no significant pulmonary edema.  Assessment: VT/VF cardiac arrest, cardiogenic shock from anterior STEMI, status post PCI to LAD Severe single-vessel coronary artery disease with 99% occlusion Radio genic shock  Plan: TTM management instituted, 36 degrees Repeat echocardiogram with severely depressed EF Remains on IV antiplatelet Sedation lightened Weaning vasopressors to maintain mean arterial pressure greater than 65 mmhg. EEG pending Neuro prognostication will depend on completion of TTM management protocol.  This patient is critically ill with multiple organ system failure; which, requires frequent high complexity decision making, assessment, support, evaluation, and titration of therapies. This was completed through the application of advanced monitoring technologies and extensive interpretation of multiple databases. During this encounter critical care time was devoted to patient care services described in this note for 32 minutes.  , DO [facility] 07/11/2019 10:37 AM

## 2019-07-11 NOTE — Care Management (Signed)
4315 07-11-19 Benefits check submitted for Bril[employer]. Case Manager will follow for cost. Graves-Bigelow, Lamar Laundry, RN, BSN Case Manager

## 2019-07-11 NOTE — CV Procedure (Signed)
2D echo attempted, but EEG being performed. Will try later

## 2019-07-11 NOTE — Progress Notes (Signed)
  Echocardiogram 2D Echocardiogram has been performed.  Edward Mendoza 07/11/2019, 10:09 AM

## 2019-07-11 NOTE — Progress Notes (Signed)
Initial Nutrition Assessment  DOCUMENTATION CODES:   Not applicable  INTERVENTION:   Tube Feeding:  Vital High Protein at 40 ml/hr Pro-Stat 30 mL QID Provides 145 g of protein, 1360 kcals and 806 mL of free water Meets 100% estimated protein needs  TF regimen and propofol at current rate providing 1983 total kcal/day   NUTRITION DIAGNOSIS:   Inadequate oral intake related to acute illness as evidenced by NPO status.   GOAL:   Patient will meet greater than or equal to 90% of their needs   MONITOR:   Vent status, Labs, Weight trends, TF tolerance, Skin  REASON FOR ASSESSMENT:   Consult, Ventilator Enteral/tube feeding initiation and management  ASSESSMENT:   49 yo male admitted post VT/VF cardiac arrest secondary to anterior STEMI, acute encephalopathy and acute respiratory failure post arrest requiring intubation. No PMH   TTM 36 degrees, Contiunous EEG with concern for seizure activity Patient is currently intubated on ventilator support MV: 15.2 L/min Temp (24hrs), Avg:94.1 F (34.5 C), Min:91.6 F (33.1 C), Max:97.9 F (36.6 C)  Propofol: 23.6 ml/hr  Unable to obtain diet and weight history from patient at this time  No previous weight encounters; current weight 113 kg. Mild edema on exam, unsure of exact dry weight  Per chest xray, the OG tube enters the stomach  Labs: reviewed; HGbA1c 5.4 Meds: KCl, ss novolog, calcium gluconate    NUTRITION - FOCUSED PHYSICAL EXAM:      Orbital Region  No depletion  Upper Arm Region  No depletion  Thoracic and Lumbar Region  Unable to assess  Buccal Region  Unable to assess  Temple Region  No depletion  Clavicle and Acromion Bone Region  No depletion  Scapular Bone Region  No depletion  Dorsal Hand  No depletion  Patellar Region  Unable to assess  Anterior Thigh Region  Unable to assess  Posterior Calf Region  No depletion  Edema (RD Assessment)  Mild       Diet Order:   Diet Order            Diet  NPO time specified  Diet effective now              EDUCATION NEEDS:   Not appropriate for education at this time  Skin:  Skin Assessment: Reviewed RN Assessment  Last BM:  rectal tube  Height:   Ht Readings from Last 1 Encounters:  07/11/19 6\' 2"  (1.88 m)    Weight:   Wt Readings from Last 1 Encounters:  07/11/19 113 kg    Ideal Body Weight:  86.4 kg  BMI:  Body mass index is 31.99 kg/m.  Estimated Nutritional Needs:   Kcal:  09/10/19 kcals (60-70% PSU, PSU 2397 kcals)  Protein:  135-160 g  Fluid:  >/= 2 L   [id number] MS, RDN, LDN, CNSC RD Pager Number and Weekend/On-Call After Hours Pager Located in [place]

## 2019-07-11 NOTE — Progress Notes (Signed)
eLink Physician-Brief Progress Note Patient Name: Edward Mendoza DOB: Apr 04, 1971 MRN: [id number]   Date of Service  07/11/2019  HPI/Events of Note  Pt admitted with cardiac arrest at home related to a STEMI, he was transferred from outside hospital and taken straight to the cath lab by interventional cardiology.  eICU Interventions  New Patient Evaluation completed.        [name] 07/11/2019, 5:44 AM

## 2019-07-11 NOTE — Care Plan (Addendum)
Three push button events so far with no eeg change to suggest seizure. Spoke with RN and updated. Please review final report for details.  Maurilio Puryear Annabelle Harman

## 2019-07-11 NOTE — Consult Note (Signed)
NAME:  Edward Mendoza, MRN:  [id number], DOB:  Jul 05, 1970, LOS: 0 ADMISSION DATE:  07/11/2019, CONSULTATION DATE:   REFERRING MD:  Dr. Launa Grill , CHIEF COMPLAINT:  Cardiac arrest  Brief History   This is a 49 year old male who presents sp cardaic arrest from V fib and V tach noted to have anterior STEMI. SP cath  History of present illness   This is a 49 yo male with no prior medical history who presents with intermittent chest pain since Saturday. He called EMS. They arrived. Shortly after EMS arrived to patient he went into witnessed VF arrest and resuscitation ensued immediately. He received 6 defibrillations and 2 mg of epi. He had a total of 45-50 minutes of compressions. ROSC was achieved shortly after patient arrived to the ED. After ROSC achieved EKG noted anterior STEMI. Patient transferred here for intervention. In our cath lab patient noted to proximal LAD occlusion. He received PCI  To LAD. Right heart cath was also performed that noted the following pressures. No fevers chills or signs of illness recently.   Coronary angiography demonstrated 99% proximal-LAD occlusion. He received PCI with 3.5 x 18 Onyx DES. Right heart cath demonstrated mildly elevated filling pressures (RA 12, PCW 23) and preserve cardiac output with MVO2 67%.   Very active at baseline. Current PPD smoker currently.   Past Medical History  None per chart review unable to reach the wife to confirm  [facility] Events   Cath performed with stent placement to LAD  Consults:  PCCM  Procedures:  Arterial line placement 07/11/19 Central line placement  07/11/19 PCI 07/11/19 Inutabtion 07/11/19   Significant Diagnostic Tests:   CT head pending from 07/11/19  Micro Data:  Covid negative on 07/11/19  Antimicrobials:  None  Interim history/subjective:  NA  Objective   Weight 113 kg, SpO2 100 %.       No intake or output data in the 24 hours ending 07/11/19 0400 Filed Weights   07/11/19 0227    Weight: 113 kg    Examination: General: Patient sedated and tinubated HENT: Some bleeding in orophayrnx from tongue biting Lungs: Mechanical breath sounds present Cardiovascular: Regular rate and ryhthm Abdomen: Non distended Extremities: No gross deformities appreciated Neuro: Patient sedated and intubted GU: deferred  Resolved Hospital Problem list   NA  Assessment & Plan:  VT/ VF cardiac arrest 2/2 anterior STEMI s/p PCI w/ pLAD DES  Cardiogenic shock  - EF estimated ~45% P:  Cardiology primary  Continue levophed for MAP goal > 65 Trend coox/ CVP Hemodynamics/ PA cath per cards  ASA, cangrelor, brillinta per cards    Acute encephalopathy post cardiac arrest - questionable some purposeful movement in cath lab and noted clenching and shivering vs ?seizure activity P:  Stat CTH TTM at 36 degrees  EEG ordered Will load with keppra and then BID  Seizure precautions  Serial neuro exams    Acute respiratory failure related to above P:  Full MV support, PRVC 8 cc/kg, rate 28 CXR and ABG on arrival to ICU VAP measures  PAD protocol with propofol/ fentanyl gtt for RASS goal    AKI Mixed respiratory / metabolic NGMA P:  Continue foley  Check mag  Strict I/Os, daily weights  BMP q12   Elevated LFTs - likely shock liver P:  Trend LFTs and INR   Hyperglycemia  P:  CBG q 4 with SSI sensitive HA1c is 5.4   Leukocytosis  - likely reactive  P:  Will check PCT, CXR, UA  Trend WBC/ fever curve   Best practice:  Diet: NPO Pain/Anxiety/Delirium protocol (if indicated): propofol/ fentanyl, RASS goal -1 VAP protocol (if indicated): yes DVT prophylaxis: heparin SQ/ heparin  GI prophylaxis: PPI Glucose control: SSI sensitive  Mobility: BR Code Status: Full  Family Communication: attempted, no answer  Disposition:  2H ICU  Labs   CBC: Recent Labs  Lab 07/11/19 0148 07/11/19 0149 07/11/19 0238  WBC  --  27.3*  --   HGB 16.0  16.0 15.6  15.6  HCT 47.0  47.0 47.6 46.0  MCV  --  94.8  --   PLT  --  269  --     Basic Metabolic Panel: Recent Labs  Lab 07/11/19 0148 07/11/19 0149 07/11/19 0238  NA 136  136 135 135  K 4.4  4.4 4.4 4.5  CL 105 104  --   CO2  --  17*  --   GLUCOSE 244* 249*  --   BUN 23* 21*  --   CREATININE 1.30* 1.41*  --   CALCIUM  --  8.4*  --    GFR: CrCl cannot be calculated (Unknown ideal weight.). Recent Labs  Lab 07/11/19 0149  WBC 27.3*    Liver Function Tests: Recent Labs  Lab 07/11/19 0149  AST 483*  ALT 142*  ALKPHOS 87  BILITOT 0.9  PROT 6.3*  ALBUMIN 3.6   No results for input(s): LIPASE, AMYLASE in the last 168 hours. No results for input(s): AMMONIA in the last 168 hours.  ABG    Component Value Date/Time   PHART 7.229 (L) 07/11/2019 0238   PCO2ART 46.2 07/11/2019 0238   PO2ART 164.0 (H) 07/11/2019 0238   HCO3 19.3 (L) 07/11/2019 0238   TCO2 21 (L) 07/11/2019 0238   ACIDBASEDEF 8.0 (H) 07/11/2019 0238   O2SAT 99.0 07/11/2019 0238     Coagulation Profile: Recent Labs  Lab 07/11/19 0149  INR 1.1    Cardiac Enzymes: No results for input(s): CKTOTAL, CKMB, CKMBINDEX, TROPONINI in the last 168 hours.  HbA1C: Hgb A1c MFr Bld  Date/Time Value Ref Range Status  07/11/2019 03:07 AM 5.4 4.8 - 5.6 % Final    Comment:    (NOTE) Pre diabetes:          5.7%-6.4% Diabetes:              >6.4% Glycemic control for   <7.0% adults with diabetes     CBG: No results for input(s): GLUCAP in the last 168 hours.  Review of Systems:   14 point ROS attained from family. Pertinent positive and negatives per HPI.   Past Medical History  He,  has no past medical history on file.   Surgical History      Social History      Family History   His family history is not on file.   Allergies Not on File   Home Medications  Prior to Admission medications   Not on File     Critical care time: 60 minutes

## 2019-07-11 NOTE — Progress Notes (Signed)
Pharmacy Antibiotic Note  Edward Mendoza is a 49 y.o. male admitted on 07/11/2019 post cardiac arrest and cath for coronary occlusion.  Pharmacy has been consulted for unasyn dosing d/t intubation in the field and prolonged cpr prior to hospitalization.   Patient currently TTM at 36 degress, was hypothermic around 33 on arrival. Wbc 18.8, pct 0.8, LA 1.8.  CCM planning 3-5 days of coverage.   Plan: Unasyn 3g IV q6 hours  Height: 6\' 2"  (188 cm) Weight: 249 lb 1.9 oz (113 kg) IBW/kg (Calculated) : 82.2  Temp (24hrs), Avg:93.6 F (34.2 C), Min:91.6 F (33.1 C), Max:96.8 F (36 C)  Recent Labs  Lab 07/11/19 0148 07/11/19 0149 07/11/19 0533 07/11/19 0538 07/11/19 0717  WBC  --  27.3* 18.8*  --   --   CREATININE 1.30* 1.41* 1.18  --  1.00  LATICACIDVEN  --   --   --  1.8  --     Estimated Creatinine Clearance: 123.4 mL/min (by C-G formula based on SCr of 1 mg/dL).    Not on File  Thank you for allowing pharmacy to be a part of this patient's care.  09/10/19 PharmD., BCPS Clinical Pharmacist 07/11/2019 9:21 AM

## 2019-07-11 NOTE — Progress Notes (Signed)
Responded to ED page to meet [name] family and escort them to the waiting area outside Cath Lab.  Established relationship of care and concern for wife and three children.  Provided updates.  Continued to check on family throughout the evening.  As children gathered around their mother Chaplain offered prayer. Chaplain will continue to follow up until patient is assigned a room.  Edward Mendoza Chaplain Resident

## 2019-07-11 NOTE — TOC Benefit Eligibility Note (Signed)
Transition of Care [facility]) Benefit Eligibility Note    Patient Details  Name: Edward Mendoza MRN: [id number] Date of Birth: December 24, 1970   Medication/Dose: Kary Kos   90 MG BID  Covered?: Yes     Prescription Coverage Preferred [facility]acy: [facility]  Spoke with Person/Company/Phone Number:: TIMIKA  @   [facility] # [phone]  Co-Pay: $195.00  Prior Approval: No  Deductible: Unmet(DEDUCTIBLE $150.00 + $45.00= 195.00/ WHEN MET COST $45.00)  Additional Notes: DEDUCTIBLE  $150.00 + $45.00= 195.00  WHEN MET COST $ 45.00    Memory Argue Phone Number: 07/11/2019, 5:07 PM

## 2019-07-11 NOTE — Plan of Care (Signed)
  Problem: Cardiac: Goal: Ability to achieve and maintain adequate cardiopulmonary perfusion will improve Outcome: Progressing Goal: Vascular access site(s) Level 0-1 will be maintained Outcome: Progressing   

## 2019-07-11 NOTE — Progress Notes (Signed)
LTM EEG hooked up and running - no initial skin breakdown - push button tested - neuro notified.  

## 2019-07-12 ENCOUNTER — Inpatient Hospital Stay (HOSPITAL_COMMUNITY): Payer: Managed Care, Other (non HMO)

## 2019-07-12 ENCOUNTER — Encounter (HOSPITAL_COMMUNITY): Payer: Self-pay | Admitting: Cardiology

## 2019-07-12 ENCOUNTER — Other Ambulatory Visit: Payer: Self-pay

## 2019-07-12 DIAGNOSIS — Z978 Presence of other specified devices: Secondary | ICD-10-CM

## 2019-07-12 DIAGNOSIS — I4901 Ventricular fibrillation: Secondary | ICD-10-CM

## 2019-07-12 DIAGNOSIS — I2102 ST elevation (STEMI) myocardial infarction involving left anterior descending coronary artery: Principal | ICD-10-CM

## 2019-07-12 LAB — CBC
HCT: 39.7 % (ref 39.0–52.0)
Hemoglobin: 13.2 g/dL (ref 13.0–17.0)
MCH: 30.7 pg (ref 26.0–34.0)
MCHC: 33.2 g/dL (ref 30.0–36.0)
MCV: 92.3 fL (ref 80.0–100.0)
Platelets: 174 10*3/uL (ref 150–400)
RBC: 4.3 MIL/uL (ref 4.22–5.81)
RDW: 12.4 % (ref 11.5–15.5)
WBC: 20.5 10*3/uL — ABNORMAL HIGH (ref 4.0–10.5)
nRBC: 0 % (ref 0.0–0.2)

## 2019-07-12 LAB — GLUCOSE, CAPILLARY
Glucose-Capillary: 117 mg/dL — ABNORMAL HIGH (ref 70–99)
Glucose-Capillary: 120 mg/dL — ABNORMAL HIGH (ref 70–99)
Glucose-Capillary: 124 mg/dL — ABNORMAL HIGH (ref 70–99)
Glucose-Capillary: 124 mg/dL — ABNORMAL HIGH (ref 70–99)
Glucose-Capillary: 138 mg/dL — ABNORMAL HIGH (ref 70–99)
Glucose-Capillary: 140 mg/dL — ABNORMAL HIGH (ref 70–99)
Glucose-Capillary: 146 mg/dL — ABNORMAL HIGH (ref 70–99)

## 2019-07-12 LAB — MAGNESIUM
Magnesium: 2.2 mg/dL (ref 1.7–2.4)
Magnesium: 2.2 mg/dL (ref 1.7–2.4)

## 2019-07-12 LAB — COOXEMETRY PANEL
Carboxyhemoglobin: 1.1 % (ref 0.5–1.5)
Carboxyhemoglobin: 1.1 % (ref 0.5–1.5)
Methemoglobin: 1.5 % (ref 0.0–1.5)
Methemoglobin: 1.6 % — ABNORMAL HIGH (ref 0.0–1.5)
O2 Saturation: 73.2 %
O2 Saturation: 91.9 %
Total hemoglobin: 11.4 g/dL — ABNORMAL LOW (ref 12.0–16.0)
Total hemoglobin: 12.2 g/dL (ref 12.0–16.0)

## 2019-07-12 LAB — BASIC METABOLIC PANEL
Anion gap: 13 (ref 5–15)
BUN: 34 mg/dL — ABNORMAL HIGH (ref 6–20)
CO2: 17 mmol/L — ABNORMAL LOW (ref 22–32)
Calcium: 8 mg/dL — ABNORMAL LOW (ref 8.9–10.3)
Chloride: 106 mmol/L (ref 98–111)
Creatinine, Ser: 2.08 mg/dL — ABNORMAL HIGH (ref 0.61–1.24)
GFR calc Af Amer: 43 mL/min — ABNORMAL LOW (ref >60)
GFR calc non Af Amer: 37 mL/min — ABNORMAL LOW (ref >60)
Glucose, Bld: 265 mg/dL — ABNORMAL HIGH (ref 70–99)
Potassium: 4.2 mmol/L (ref 3.5–5.1)
Sodium: 136 mmol/L (ref 135–145)

## 2019-07-12 LAB — HEPATIC FUNCTION PANEL
ALT: 214 U/L — ABNORMAL HIGH (ref 0–44)
AST: 709 U/L — ABNORMAL HIGH (ref 15–41)
Albumin: 2.7 g/dL — ABNORMAL LOW (ref 3.5–5.0)
Alkaline Phosphatase: 62 U/L (ref 38–126)
Bilirubin, Direct: 0.2 mg/dL (ref 0.0–0.2)
Indirect Bilirubin: 0.4 mg/dL (ref 0.3–0.9)
Total Bilirubin: 0.6 mg/dL (ref 0.3–1.2)
Total Protein: 5.1 g/dL — ABNORMAL LOW (ref 6.5–8.1)

## 2019-07-12 LAB — PROCALCITONIN: Procalcitonin: 4.83 ng/mL

## 2019-07-12 LAB — PHOSPHORUS
Phosphorus: 3.2 mg/dL (ref 2.5–4.6)
Phosphorus: 3.5 mg/dL (ref 2.5–4.6)

## 2019-07-12 LAB — HIV ANTIBODY (ROUTINE TESTING W REFLEX): HIV Screen 4th Generation wRfx: NONREACTIVE

## 2019-07-12 MED ORDER — PRO-STAT SUGAR FREE PO LIQD
30.0000 mL | Freq: Two times a day (BID) | ORAL | Status: DC
  Administered 2019-07-12 – 2019-07-13 (×3): 30 mL
  Filled 2019-07-12 (×2): qty 30

## 2019-07-12 MED ORDER — BUSPIRONE HCL 10 MG PO TABS
30.0000 mg | ORAL_TABLET | Freq: Two times a day (BID) | ORAL | Status: DC
  Administered 2019-07-12 – 2019-07-13 (×2): 30 mg
  Filled 2019-07-12 (×2): qty 3

## 2019-07-12 MED ORDER — MIDAZOLAM HCL 2 MG/2ML IJ SOLN
2.0000 mg | INTRAMUSCULAR | Status: DC | PRN
  Administered 2019-07-12 – 2019-07-13 (×2): 2 mg via INTRAVENOUS
  Filled 2019-07-12 (×2): qty 2

## 2019-07-12 MED ORDER — MIDAZOLAM HCL 2 MG/2ML IJ SOLN
INTRAMUSCULAR | Status: AC
  Administered 2019-07-12: 4 mg via INTRAVENOUS
  Filled 2019-07-12: qty 4

## 2019-07-12 MED ORDER — VITAL HIGH PROTEIN PO LIQD
1000.0000 mL | ORAL | Status: DC
  Administered 2019-07-13: 1000 mL

## 2019-07-12 MED ORDER — MILRINONE LACTATE IN DEXTROSE 20-5 MG/100ML-% IV SOLN
0.1250 ug/kg/min | INTRAVENOUS | Status: DC
  Administered 2019-07-12 – 2019-07-17 (×5): 0.125 ug/kg/min via INTRAVENOUS
  Filled 2019-07-12 (×6): qty 100

## 2019-07-12 NOTE — Plan of Care (Signed)
Event button was pushed at 1243 on 07/12/2019 for unclear reason.  Concomitant EEG before during and after the event did not show any EEG change to suggest seizure.  Please review final report for details.

## 2019-07-12 NOTE — Progress Notes (Signed)
maint complete- no skin breakdown under fp1, fp2, f7, f8

## 2019-07-12 NOTE — Consult Note (Addendum)
Advanced Heart Failure Team Consult Note   Primary Physician: Mateo Flow, MD PCP-Cardiologist:  Dr. Ellyn Hack   Reason for Consultation: cardiogenic shock, s/p anterior MI  HPI:    Edward Mendoza is seen today for evaluation of cardiogenic shock at the request of Dr. Claiborne Billings, general cardiology.  49 y/o male with no PMH prior to this admission. Was transported to [facility] by EMS on 3/10 for witnessed out of hospital cardiac arrest. EMS was called to his home for chest pain. On arrival he was alert and interactive, then suffered witnessed VF arrest w/ immediate initiation of ACLS. Received defibrillation x 6 + Epi, total of 45-50 min resuscitation time w/ ROSC, shortly after arriving to the ED. 12 lead EKG showed acute anterior STEMI. 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 Underwent PCI + DES. RHC demonstrated mildly elevated filling pressures. PCWP 20 mmHg. CO/CI by Fick: 4.77/2.04. There was concern for possible seizure activity following PCI. Head CT post cath showed cerebellar infarct which appears remote or subacute in nature. EEG w/ profound encephalopathy.   2D Echo showed severely reduced LVEF at 15%. RV systolic function mildly reduced.    Currently intubated and sedated, on hypothermia protocol. On NE 26 mcg/kg/min.  MAPs in the 80s. On amiodarone gtt for post MI afib. Currently NSR HR 60s.     Swan#s CVP 13 PCWP 16 PAP 37/21 (26) CO 4.10 CI 1.72 SVR 1287  Co-ox 73%  R/LHC 07/11/19 Conclusion    There is moderate left ventricular systolic dysfunction.  LV end diastolic pressure is moderately elevated.  The left ventricular ejection fraction is 35-45% by visual estimate.  There is no aortic valve stenosis.  Prox LAD to Mid LAD lesion is 95% stenosed.  Post intervention, there is a 0% residual stenosis.  A drug-eluting stent was successfully placed using a STENT  RESOLUTE ONYX 3.5X18.   SUMMARY  Anterior STEMI complicated by VF arrest.    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  Otherwise minimal disease elsewhere.  Successful DES PCI of proximal LAD using resolute Onyx DES 3.5 x 18 mm postdilated to 4.0 mm.  Systolic hypotension with borderline cardiogenic shock with systolic pressures in the 90s over 60s requiring Levophed at 10 mcg/min. -->  Cardiac output-index 4.77-2.04; cardiac power 0.8.  No evidence of pulmonary hypertension     Echo 07/13/19 Left ventricular ejection fraction, by estimation, is 15%. The left ventricle has severely decreased function. The left ventricle demonstrates global hypokinesis. There is moderate left ventricular hypertrophy. Left ventricular diastolic parameters are indeterminate. 2. Right ventricular systolic function is mildly reduced. The right ventricular size is normal. Tricuspid regurgitation signal is inadequate for assessing PA pressure. 3. The mitral valve is grossly normal. Trivial mitral valve regurgitation. 4. The aortic valve is tricuspid. Aortic valve regurgitation is not visualized. 5. The inferior vena cava is dilated in size with <50% respiratory variability, suggesting right atrial pressure of 15 mmHg.  Review of Systems: [y] = yes, [ ]  = no   . General: Weight gain [ ] ; Weight loss [ ] ; Anorexia [ ] ; Fatigue [ ] ; Fever [ ] ; Chills [ ] ; Weakness [ ]   . Cardiac: Chest pain/pressure [ ] ; Resting SOB [ ] ; Exertional SOB [ ] ; Orthopnea [ ] ; Pedal Edema [ ] ; Palpitations [ ] ; Syncope [ ] ; Presyncope [ ] ; Paroxysmal nocturnal dyspnea[ ]   . Pulmonary: Cough [ ] ;  Wheezing[ ] ; Hemoptysis[ ] ; Sputum [ ] ; Snoring [ ]   . GI: Vomiting[ ] ; Dysphagia[ ] ; Melena[ ] ; Hematochezia [ ] ; Heartburn[ ] ; Abdominal pain [ ] ; Constipation [ ] ; Diarrhea [ ] ; BRBPR [ ]   . GU: Hematuria[ ] ; Dysuria [ ] ; Nocturia[ ]   . Vascular: Pain in legs with walking [ ] ; Pain in feet  with lying flat [ ] ; Non-healing sores [ ] ; Stroke [ ] ; TIA [ ] ; Slurred speech [ ] ;  . Neuro: Headaches[ ] ; Vertigo[ ] ; Seizures[ ] ; Paresthesias[ ] ;Blurred vision [ ] ; Diplopia [ ] ; Vision changes [ ]   . Ortho/Skin: Arthritis [ ] ; Joint pain [ ] ; Muscle pain [ ] ; Joint swelling [ ] ; Back Pain [ ] ; Rash [ ]   . Psych: Depression[ ] ; Anxiety[ ]   . Heme: Bleeding problems [ ] ; Clotting disorders [ ] ; Anemia [ ]   . Endocrine: Diabetes [ ] ; Thyroid dysfunction[ ]   Home Medications Prior to Admission medications   Not on File    Past Medical History: History reviewed. No pertinent past medical history.  Past Surgical History: Past Surgical History:  Procedure Laterality Date  . ARTERIAL LINE INSERTION N/A 07/11/2019   Procedure: ARTERIAL LINE INSERTION;  Surgeon: Leonie Man, MD;  Location: [place] [facility];  Service: Cardiovascular;  Laterality: N/A;  . CORONARY STENT INTERVENTION N/A 07/11/2019   Procedure: CORONARY STENT INTERVENTION;  Surgeon: Leonie Man, MD;  Location: [place] [facility];  Service: Cardiovascular;  Laterality: N/A;  . CORONARY/GRAFT ACUTE MI REVASCULARIZATION N/A 07/11/2019   Procedure: Coronary/Graft Acute MI Revascularization;  Surgeon: Leonie Man, MD;  Location: [place] [facility];  Service: Cardiovascular;  Laterality: N/A;  . RIGHT/LEFT HEART CATH AND CORONARY ANGIOGRAPHY N/A 07/11/2019   Procedure: RIGHT/LEFT HEART CATH AND CORONARY ANGIOGRAPHY;  Surgeon: Leonie Man, MD;  Location: [place] [facility];  Service: Cardiovascular;  Laterality: N/A;    Family History: No family history on file.  Social History: Social History   Socioeconomic History  . Marital status: Married    Spouse name: Not on file  . Number of children: Not on file  . Years of education: Not on file  . Highest education level: Not on file  Occupational History  . Not on file  Tobacco Use  . Smoking status: Current Every Day Smoker    Packs/day: 1.00    Years:  20.00    Pack years: 20.00    Types: Cigarettes    Start date: 2000  . Smokeless tobacco: Never Used  Substance and Sexual Activity  . Alcohol use: Not Currently  . Drug use: Not Currently  . Sexual activity: Yes  Other Topics Concern  . Not on file  Social History Narrative  . Not on file   Social Determinants of Health   Financial Resource Strain:   . Difficulty of Paying Living Expenses:   Food Insecurity:   . Worried About Charity fundraiser in the Last Year:   . Arboriculturist in the Last Year:   Transportation Needs:   . Film/video editor (Medical):   Marland Kitchen Lack of Transportation (Non-Medical):   Physical Activity:   . Days of Exercise per Week:   . Minutes of Exercise per Session:   Stress:   . Feeling of Stress :   Social Connections:   . Frequency of Communication with Friends and Family:   . Frequency of Social Gatherings with Friends and Family:   . Attends Religious Services:   .  Active Member of Clubs or Organizations:   . Attends Archivist Meetings:   Marland Kitchen Marital Status:     Allergies:  Not on File  Objective:    Vital Signs:   Temp:  [94.5 F (34.7 C)-98.2 F (36.8 C)] 94.5 F (34.7 C) (03/11 1200) Pulse Rate:  [60-90] 60 (03/11 1200) Resp:  [16-24] 24 (03/11 1200) BP: (83-121)/(35-83) 96/67 (03/11 1200) SpO2:  [93 %-100 %] 100 % (03/11 1200) FiO2 (%):  [40 %] 40 % (03/11 1120) Weight:  [105.2 kg] 105.2 kg (03/11 0500) Last BM Date: 07/11/19  Weight change: Filed Weights   07/11/19 0227 07/12/19 0500  Weight: 113 kg 105.2 kg    Intake/Output:   Intake/Output Summary (Last 24 hours) at 07/12/2019 1256 Last data filed at 07/12/2019 1200 Gross per 24 hour  Intake 4017.56 ml  Output 800 ml  Net 3217.56 ml      Physical Exam    CVP 13 General:  Critically ill WM, intubated and sedated, + bair hugger HEENT: +ETT Neck: supple. JVD 13 cm . Carotids 2+ bilat; no bruits. No lymphadenopathy or thyromegaly appreciated.  Cor:  PMI nondisplaced. Regular rate & rhythm. No rubs, gallops or murmurs. Lungs: intubated, clear  Abdomen: soft, nondistended. No hepatosplenomegaly. No bruits or masses. Good bowel sounds. Extremities: no cyanosis, clubbing, rash, 1+ bilateral LEE edema, + Rt femoral swan ganz cath  GU: + foley  Neuro: intubated and sedated   Telemetry   NSR 67 bpm   EKG    NSR RBBB 64 bpm   Labs   Basic Metabolic Panel: Recent Labs  Lab 07/11/19 0149 07/11/19 0238 07/11/19 0533 07/11/19 0717 07/11/19 1138 07/11/19 1403 07/11/19 1700 07/12/19 0357  NA 135   < > 139 138  --  137 139 136  K 4.4   < > 3.3* 3.6  --  6.0* 5.6* 4.2  CL 104  --  108 109  --   --  111 106  CO2 17*  --  20*  --   --   --  19* 17*  GLUCOSE 249*  --  166* 148*  --   --  120* 265*  BUN 21*  --  23* 25*  --   --  31* 34*  CREATININE 1.41*  --  1.18 1.00  --   --  1.65* 2.08*  CALCIUM 8.4*   < > 7.6*  --   --   --  7.5* 8.0*  MG  --   --  2.6*  --  2.5*  --  2.2 2.2  PHOS  --   --  2.6  --  2.2*  --  2.9 3.2   < > = values in this interval not displayed.    Liver Function Tests: Recent Labs  Lab 07/11/19 0149 07/11/19 0533 07/12/19 0357  AST 483* 851* 709*  ALT 142* 197* 214*  ALKPHOS 87 69 62  BILITOT 0.9 0.9 0.6  PROT 6.3* 5.5* 5.1*  ALBUMIN 3.6 3.2* 2.7*   No results for input(s): LIPASE, AMYLASE in the last 168 hours. No results for input(s): AMMONIA in the last 168 hours.  CBC: Recent Labs  Lab 07/11/19 0149 07/11/19 0238 07/11/19 0310 07/11/19 0533 07/11/19 0717 07/11/19 1403 07/12/19 0357  WBC 27.3*  --   --  18.8*  --   --  20.5*  NEUTROABS  --   --   --  15.4*  --   --   --  HGB 15.6   < > 15.3 15.5 14.6 14.6 13.2  HCT 47.6   < > 45.0 46.3 43.0 43.0 39.7  MCV 94.8  --   --  91.9  --   --  92.3  PLT 269  --   --  255  --   --  174   < > = values in this interval not displayed.    Cardiac Enzymes: No results for input(s): CKTOTAL, CKMB, CKMBINDEX, TROPONINI in the last 168 hours.   BNP: BNP (last 3 results) Recent Labs    07/11/19 0533  BNP 23.3    ProBNP (last 3 results) No results for input(s): PROBNP in the last 8760 hours.   CBG: Recent Labs  Lab 07/11/19 1948 07/12/19 0025 07/12/19 0404 07/12/19 0758 07/12/19 1112  GLUCAP 94 117* 140* 146* 124*    Coagulation Studies: Recent Labs    07/11/19 0149 07/11/19 0538  LABPROT 14.0 15.0  INR 1.1 1.2     Imaging   DG CHEST PORT 1 VIEW  Result Date: 07/12/2019 CLINICAL DATA:  Cardiac arrest. EXAM: PORTABLE CHEST 1 VIEW COMPARISON:  07/11/2019 FINDINGS: The endotracheal tube, NG tube and Swan-Ganz catheters are stable. Low lung volumes with vascular crowding and streaky basilar atelectasis but no infiltrates or effusions. No pneumothorax. IMPRESSION: 1. Stable support apparatus. 2. Low lung volumes with vascular crowding and streaky basilar atelectasis. Electronically Signed   By: Marijo Sanes M.D.   On: 07/12/2019 08:58   DG Abd Portable 1V  Result Date: 07/11/2019 CLINICAL DATA:  Feeding tube placement. EXAM: PORTABLE ABDOMEN - 1 VIEW COMPARISON:  No prior. FINDINGS: Swan-Ganz catheter noted. Tip is not visualized. NG tube noted with its tip in the upper most portion stomach. Side hole is at the gastroesophageal junction. Advancement of the NG tube approximately 15 cm should be considered. No gastric distention. No bowel distention. No free air. Scratch IMPRESSION: NG tube noted with its tip in the upper most portion of the stomach. Side hole is at the gastroesophageal junction. Advancement of the NG tube approximately 15 cm should be considered. Electronically Signed   By: Marcello Moores  Register   On: 07/11/2019 13:26   Overnight EEG with video  Result Date: 07/12/2019 [name], MD     07/12/2019 11:56 AM Patient Name: Edward Mendoza MRN: [id number] Epilepsy Attending: [name] Referring Physician/Provider: Dr Glenetta Hew Duration: 07/11/2019 0924 to 07/12/2019 0924 Total Study duration:  24 hours  Patient history: 49yo M who presented with cardiac arrest on TTM. EEG to evaluate for seizure  Level of alertness: comatose  AEDs during EEG study: LEV, propofol  Technical aspects: This EEG study was done with scalp electrodes positioned according to the 10-20 International system of electrode placement. Electrical activity was acquired at a sampling rate of 500Hz  and reviewed with a high frequency filter of 70Hz  and a low frequency filter of 1Hz . EEG data were recorded continuously and digitally stored.  DESCRIPTION: EEG showed generalized 13-15Hz  sharply contoured beta activity admixed with polymorphic 6-7hz  delta activity. Brief periods of generalized eeg attenuation lasting 2-3 seconds was also noted. EEG was not reactive to tactile stimulation. Hyperventilation and photic stimulation were not performed. Event button was pressed on 07/12/2019 1543, 1615 and 1630 for arm twitching. Concomitant eeg before, during and after the event didn't show any change to suggest seizure.  ABNORMALITY - Continuous slow, generalized - Excessive beta, generalized - Background attenuation, generalized  IMPRESSION: This study is suggestive of profound diffuse  encephalopathy, non specific to etiology. No seizures or epileptiform discharges were seen throughout the recording.  Event button was pressed as describe above without eeg change and were likely not epileptic. Priyanka Barbra Sarks      Medications:     Current Medications: . aspirin  81 mg Per Tube Daily  . busPIRone  30 mg Oral BID  . chlorhexidine gluconate (MEDLINE KIT)  15 mL Mouth Rinse BID  . Chlorhexidine Gluconate Cloth  6 each Topical Daily  . feeding supplement (PRO-STAT SUGAR FREE 64)  30 mL Per Tube QID  . feeding supplement (PRO-STAT SUGAR FREE 64)  30 mL Per Tube BID  . feeding supplement (VITAL HIGH PROTEIN)  1,000 mL Per Tube Q24H  . feeding supplement (VITAL HIGH PROTEIN)  1,000 mL Per Tube Q24H  . heparin  5,000 Units  Subcutaneous Q8H  . insulin aspart  0-9 Units Subcutaneous Q4H  . mouth rinse  15 mL Mouth Rinse 10 times per day  . pantoprazole sodium  40 mg Per Tube Daily  . sodium chloride flush  10-40 mL Intracatheter Q12H  . sodium chloride flush  3 mL Intravenous Q12H  . ticagrelor  90 mg Per Tube BID     Infusions: . sodium chloride Stopped (07/11/19 0649)  . sodium chloride    . amiodarone 30 mg/hr (07/12/19 1200)  . ampicillin-sulbactam (UNASYN) IV Stopped (07/12/19 1102)  . fentaNYL infusion INTRAVENOUS 300 mcg/hr (07/12/19 1200)  . levETIRAcetam Stopped (07/12/19 0506)  . norepinephrine (LEVOPHED) Adult infusion 26 mcg/min (07/12/19 1200)  . propofol (DIPRIVAN) infusion Stopped (07/12/19 1159)  . sodium chloride        Assessment/Plan   1. Witnessed Out of Hospital VF Arrest:  - Witnessed by EMS w/ prompt initiation of ACLS - Estimated 40-50 min resuccitation time - Secondary to acute anterior STEMI w/ proximal LAD occlusion, s/p PCI - EF 15% - On hypothermia protocol - rhythm currently stable on tele but risk of recurrence high w/ low EF - Keep K >4.0 and Mg >2.0  - Will wait to see if any significant neurological recovery  - if he survives, will need LifeVest at d/c   2. Anterior STEMI - LHC showed 99% proximal LAD thrombotic occlusion, s/p PCI + DES placement. No other significant disease - EF 15%.  - Continue DAPT w/ ASA + Brilinta  - no statin currently given shock liver - no  blocker w/ low output  3. Cardiogenic Shock  - EF 15%. RV systolic function mildly reduced  - CO/CI at time of cath by Fick: 4.77/2.04 - Currently on NE 26 mcg/min. MAPs 80s - CI low at 1.72. SVR 1287 - Will add low dose milrinone 0.125 mcg/kg/min  - Volume status ok, CVP 13.   4. Post MI Atrial Fibrillation: - now NSR on amiodarone gtt, continue at 30 mg/hr   5. Shock Liver:  - 2/2 Acute MI/ cardiogenic shock  - Follow LFTs  6. AKI:  - SCr worsening, 1.00>>1.65>>2.00  - 2/2 low  output/ cardiorenal  - Continue NE. May need dobutamine  7. Leukocytosis: -WBCs 21->18->20K - ? Reactive     Length of Stay: 1  Brittainy Simmons, PA-C  07/12/2019, 12:56 PM  Advanced Heart Failure Team Pager [phone] (M-F; 7a - 4p)  Please contact [facility] for night-coverage after hours (4p -7a ) and weekends on [email or web address]  Agree with above  35 y/o smoker admitted with anterior STEMI c/b prolonged VF arrest. LAD opened emergently. Now on vent  for cooling protocol. Not waking up. ? Myoclonic activity. Currently on continuous EEG. Remains on NE 32.  Echo 10% RV mild to moderately reduced  Swan numbers done personally  CVP 13 PCWP 16 PAP 37/21 (26) CO 4.10 CI 1.72 SVR 1287  Co-ox 73%  General:  Intubated unresponsive No gag HEENT: normal + ETT Neck: supple. no JVD. Carotids 2+ bilat; no bruits. No lymphadenopathy or thryomegaly appreciated. Cor: PMI nondisplaced. Regular rate & rhythm. No rubs, gallops or murmurs. Lungs: clear Abdomen: soft, nontender, nondistended. No hepatosplenomegaly. No bruits or masses. Good bowel sounds. Extremities: no cyanosis, clubbing, rash, edema Rfem swan Neuro: intubated unresponsive  He has cardiogenic shock in setting of massive anterior STEMI. EF 10%. Volumes status ok. Will continue NE and add milrinone. Main concern now is anoxic brain injury. Await results of EEG. Will need brain MRI.   CRITICAL CARE Performed by: Glori Bickers  Total critical care time: 35 minutes  Critical care time was exclusive of separately billable procedures and treating other patients.  Critical care was necessary to treat or prevent imminent or life-threatening deterioration.  Critical care was time spent personally by me (independent of midlevel providers or residents) on the following activities: development of treatment plan with patient and/or surrogate as well as nursing, discussions with consultants, evaluation of patient's response to  treatment, examination of patient, obtaining history from patient or surrogate, ordering and performing treatments and interventions, ordering and review of laboratory studies, ordering and review of radiographic studies, pulse oximetry and re-evaluation of patient's condition.  Glori Bickers, MD  5:38 PM

## 2019-07-12 NOTE — Procedures (Addendum)
Patient Name: Edward Mendoza  MRN: [id number]  Epilepsy Attending: Charlsie Quest  Referring Physician/Provider: Dr Bryan Lemma Duration: 07/11/2019 4503 to 07/12/2019 0924 Total Study duration: 24 hours  Patient history: 49yo M who presented with cardiac arrest on TTM. EEG to evaluate for seizure  Level of alertness: comatose  AEDs during EEG study: LEV, propofol  Technical aspects: This EEG study was done with scalp electrodes positioned according to the 10-20 International system of electrode placement. Electrical activity was acquired at a sampling rate of 500Hz  and reviewed with a high frequency filter of 70Hz  and a low frequency filter of 1Hz . EEG data were recorded continuously and digitally stored.   DESCRIPTION: EEG showed generalized 13-15Hz  sharply contoured beta activity admixed with polymorphic 6-7hz  theta activity. Brief periods of generalized eeg attenuation lasting 2-3 seconds was also noted. EEG was not reactive to tactile stimulation. Hyperventilation and photic stimulation were not performed.  Event button was pressed on 07/11/2019 1543, 1615 and 1630 for arm twitching. Concomitant eeg before, during and after the event didn't show any change to suggest seizure.   ABNORMALITY - Continuous slow, generalized - Excessive beta, generalized - Background attenuation, generalized  IMPRESSION: This study is suggestive of profound diffuse encephalopathy, non specific to etiology. No seizures or epileptiform discharges were seen throughout the recording.  Event button was pressed as describe above without eeg change and were likely not epileptic.  Amanee Iacovelli 

## 2019-07-12 NOTE — Progress Notes (Signed)
NAME:  Edward Mendoza, MRN:  [id number], DOB:  09-21-1970, LOS: 1 ADMISSION DATE:  07/11/2019, CONSULTATION DATE:   REFERRING MD:  Dr. Launa Grill , CHIEF COMPLAINT:  Cardiac arrest  Brief History    This is a 49 year old male who presents sp cardaic arrest from V fib and V tach noted to have anterior STEMI. SP cath  History of present illness    This is a 49 yo male with no prior medical history who presents with intermittent chest pain since Saturday. He called EMS. They arrived. Shortly after EMS arrived to patient he went into witnessed VF arrest and resuscitation ensued immediately. He received 6 defibrillations and 2 mg of epi. He had a total of 45-50 minutes of compressions. ROSC was achieved shortly after patient arrived to the ED. After ROSC achieved EKG noted anterior STEMI. Patient transferred here for intervention. In our cath lab patient noted to proximal LAD occlusion. He received PCI  To LAD. Right heart cath was also performed that noted the following pressures. No fevers chills or signs of illness recently.   Coronary angiography demonstrated 99% proximal-LAD occlusion. He received PCI with 3.5 x 18 Onyx DES. Right heart cath demonstrated mildly elevated filling pressures (RA 12, PCW 23) and preserve cardiac output with MVO2 67%. Very active at baseline. Current PPD smoker currently.   Past Medical History  None per chart review unable to reach the wife to confirm  [facility]   Cath performed with stent placement to LAD  Consults:  PCCM  Procedures:  Swan 3/10 Inutabtion 07/11/19 PCI 07/11/19  Significant Diagnostic Tests:   CT head  07/11/19> R cerebellar infarct which appears remote or subacute in nature.   Micro Data:  Covid negative on 07/11/19  Antimicrobials:  None  Interim history/subjective:   Patient mains critically ill intubated mechanical life support.  TTM management protocol in place.  Objective   Blood pressure 107/74, pulse 63,  temperature (!) 95 F (35 C), resp. rate (!) 24, height 6\' 2"  (1.88 m), weight 105.2 kg, SpO2 100 %. PAP: (27-48)/(13-28) 34/19 CVP:  [4 mmHg-13 mmHg] 10 mmHg CO:  [3.4 L/min-4.6 L/min] 4.1 L/min CI:  [1.4 L/min/m2-1.9 L/min/m2] 1.7 L/min/m2  Vent Mode: PRVC FiO2 (%):  [40 %] 40 % Set Rate:  [24 bmp] 24 bmp Vt Set:  [650 mL] 650 mL PEEP:  [5 cmH20] 5 cmH20 Plateau Pressure:  [19 cmH20-25 cmH20] 19 cmH20   Intake/Output Summary (Last 24 hours) at 07/12/2019 1120 Last data filed at 07/12/2019 1100 Gross per 24 hour  Intake 4047.44 ml  Output 730 ml  Net 3317.44 ml   Filed Weights   07/11/19 0227 07/12/19 0500  Weight: 113 kg 105.2 kg    Examination: General: Intubated on mechanical life support TTM management critically ill HENT: NCAT, sclera clear, endotracheal tube in place Lungs: Bilateral mechanically ventilated breath sounds Cardiovascular: Regular rate rhythm, S1-S2 Abdomen: Soft nontender nondistended obese pannus Extremities: Symmetric bulk and tone no rash Neuro: Sedated GU: Foley Skin: Cooling pads in place  Resolved Hospital Problem list   NA  Assessment & Plan:   VT/ VF cardiac arrest 2/2 anterior STEMI s/p PCI w/ pLAD DES  -downtime > 30 minutes Atrial fibrillation, intermittent  Elevated APTT Cardiogenic shock, estimated EF 10% P:  Norepinephrine maintain mean arterial pressure will greater than 65 mmHg, wean as tolerated Trend coox CVP Continue aspirin plus Brilinta per cardiology Continue amiodarone drip  Acute encephalopathy post cardiac arrest - questionable  some purposeful movement in cath lab and noted clenching and shivering vs ?seizure activity -CT H with evidence of R cerebellar infarct which does not appear acute  ? Seizure activity  -s/p keppra load  -with prolonged downtime is certainly at risk for anoxic injury  P:  EEG with profound encephalopathy Continue twice daily Keppra Seizure precautions Definite concern for anoxic  injury Once rewarmed from TTM eventually consider MRI and neurology input for prognostication   Acute respiratory failure in setting of cardiac arrest  P:  PAD protocol Continue weaning FiO2 as tolerated to maintain SPO2  AKI, improving  -liekly in setting of hypoperfusion  Mixed respiratory / metabolic NGMA P:  Foley in place Follow I's and O's BMP, correct electrolytes as needed  Hypocalcemia iCal 1.09 Hypokalemia P Continue to follow and correct electrolytes as needed  Elevated LFTs - likely in setting of hypoperfusion  P:  Follow LFTs likely from hypoperfusion  Hyperglycemia  P:  CBGs, every 4 with SSI  Leukocytosis  - likely reactive. PCT reassuring  P:  CBC and temperature curve  Inadequate PO intake -intubated P Restart tube feeds as soon as able  Best practice:  Diet: EN per RDN  Pain/Anxiety/Delirium protocol (if indicated): propofol/ fentanyl, RASS goal -1 to -2 VAP protocol (if indicated): yes DVT prophylaxis: heparin SQ GI prophylaxis: PPI Glucose control: SSI sensitive  Mobility: BR Code Status: Full  Family Communication: attempted, no answer  Disposition:  2H ICU    Labs   CBC: Recent Labs  Lab 07/11/19 0149 07/11/19 0238 07/11/19 0310 07/11/19 0533 07/11/19 0717 07/11/19 1403 07/12/19 0357  WBC 27.3*  --   --  18.8*  --   --  20.5*  NEUTROABS  --   --   --  15.4*  --   --   --   HGB 15.6   < > 15.3 15.5 14.6 14.6 13.2  HCT 47.6   < > 45.0 46.3 43.0 43.0 39.7  MCV 94.8  --   --  91.9  --   --  92.3  PLT 269  --   --  255  --   --  174   < > = values in this interval not displayed.    Basic Metabolic Panel: Recent Labs  Lab 07/11/19 0149 07/11/19 0238 07/11/19 0533 07/11/19 0717 07/11/19 1138 07/11/19 1403 07/11/19 1700 07/12/19 0357  NA 135   < > 139 138  --  137 139 136  K 4.4   < > 3.3* 3.6  --  6.0* 5.6* 4.2  CL 104  --  108 109  --   --  111 106  CO2 17*  --  20*  --   --   --  19* 17*  GLUCOSE 249*  --   166* 148*  --   --  120* 265*  BUN 21*  --  23* 25*  --   --  31* 34*  CREATININE 1.41*  --  1.18 1.00  --   --  1.65* 2.08*  CALCIUM 8.4*  --  7.6*  --   --   --  7.5* 8.0*  MG  --   --  2.6*  --  2.5*  --  2.2 2.2  PHOS  --   --  2.6  --  2.2*  --  2.9 3.2   < > = values in this interval not displayed.   GFR: Estimated Creatinine Clearance: 57.4 mL/min (A) (by C-G formula based  on SCr of 2.08 mg/dL (H)). Recent Labs  Lab 07/11/19 0149 07/11/19 0533 07/11/19 0538 07/12/19 0357  PROCALCITON  --  0.80  --  4.83  WBC 27.3* 18.8*  --  20.5*  LATICACIDVEN  --   --  1.8  --     Liver Function Tests: Recent Labs  Lab 07/11/19 0149 07/11/19 0533 07/12/19 0357  AST 483* 851* 709*  ALT 142* 197* 214*  ALKPHOS 87 69 62  BILITOT 0.9 0.9 0.6  PROT 6.3* 5.5* 5.1*  ALBUMIN 3.6 3.2* 2.7*   No results for input(s): LIPASE, AMYLASE in the last 168 hours. No results for input(s): AMMONIA in the last 168 hours.  ABG    Component Value Date/Time   PHART 7.393 07/11/2019 1403   PCO2ART 26.7 (L) 07/11/2019 1403   PO2ART 101.0 07/11/2019 1403   HCO3 16.5 (L) 07/11/2019 1403   TCO2 17 (L) 07/11/2019 1403   ACIDBASEDEF 7.0 (H) 07/11/2019 1403   O2SAT 56.2 07/11/2019 2323     Coagulation Profile: Recent Labs  Lab 07/11/19 0149 07/11/19 0538  INR 1.1 1.2    Cardiac Enzymes: No results for input(s): CKTOTAL, CKMB, CKMBINDEX, TROPONINI in the last 168 hours.  HbA1C: Hgb A1c MFr Bld  Date/Time Value Ref Range Status  07/11/2019 03:07 AM 5.4 4.8 - 5.6 % Final    Comment:    (NOTE) Pre diabetes:          5.7%-6.4% Diabetes:              >6.4% Glycemic control for   <7.0% adults with diabetes     CBG: Recent Labs  Lab 07/11/19 1948 07/12/19 0025 07/12/19 0404 07/12/19 0758 07/12/19 1112  GLUCAP 94 117* 140* 146* 124*    This patient is critically ill with multiple organ system failure; which, requires frequent high complexity decision making, assessment, support,  evaluation, and titration of therapies. This was completed through the application of advanced monitoring technologies and extensive interpretation of multiple databases. During this encounter critical care time was devoted to patient care services described in this note for 34 minutes.   Josephine Igo, DO [facility] 07/12/2019 11:31 AM

## 2019-07-12 NOTE — Plan of Care (Signed)

## 2019-07-13 ENCOUNTER — Encounter (HOSPITAL_COMMUNITY): Payer: Self-pay | Admitting: Cardiology

## 2019-07-13 DIAGNOSIS — F17200 Nicotine dependence, unspecified, uncomplicated: Secondary | ICD-10-CM

## 2019-07-13 DIAGNOSIS — R570 Cardiogenic shock: Secondary | ICD-10-CM

## 2019-07-13 HISTORY — DX: Nicotine dependence, unspecified, uncomplicated: F17.200

## 2019-07-13 LAB — POCT I-STAT 7, (LYTES, BLD GAS, ICA,H+H)
Acid-base deficit: 5 mmol/L — ABNORMAL HIGH (ref 0.0–2.0)
Bicarbonate: 21.2 mmol/L (ref 20.0–28.0)
Calcium, Ion: 1.12 mmol/L — ABNORMAL LOW (ref 1.15–1.40)
HCT: 29 % — ABNORMAL LOW (ref 39.0–52.0)
Hemoglobin: 9.9 g/dL — ABNORMAL LOW (ref 13.0–17.0)
O2 Saturation: 92 %
Patient temperature: 36.2
Potassium: 3.7 mmol/L (ref 3.5–5.1)
Sodium: 137 mmol/L (ref 135–145)
TCO2: 22 mmol/L (ref 22–32)
pCO2 arterial: 40.9 mmHg (ref 32.0–48.0)
pH, Arterial: 7.319 — ABNORMAL LOW (ref 7.350–7.450)
pO2, Arterial: 67 mmHg — ABNORMAL LOW (ref 83.0–108.0)

## 2019-07-13 LAB — BASIC METABOLIC PANEL
Anion gap: 11 (ref 5–15)
BUN: 31 mg/dL — ABNORMAL HIGH (ref 6–20)
CO2: 19 mmol/L — ABNORMAL LOW (ref 22–32)
Calcium: 7.5 mg/dL — ABNORMAL LOW (ref 8.9–10.3)
Chloride: 106 mmol/L (ref 98–111)
Creatinine, Ser: 1.42 mg/dL — ABNORMAL HIGH (ref 0.61–1.24)
GFR calc Af Amer: >60 mL/min (ref >60)
GFR calc non Af Amer: 59 mL/min — ABNORMAL LOW (ref >60)
Glucose, Bld: 141 mg/dL — ABNORMAL HIGH (ref 70–99)
Potassium: 3.6 mmol/L (ref 3.5–5.1)
Sodium: 136 mmol/L (ref 135–145)

## 2019-07-13 LAB — COOXEMETRY PANEL
Carboxyhemoglobin: 1.2 % (ref 0.5–1.5)
Methemoglobin: 1.6 % — ABNORMAL HIGH (ref 0.0–1.5)
O2 Saturation: 71 %
Total hemoglobin: 11.6 g/dL — ABNORMAL LOW (ref 12.0–16.0)

## 2019-07-13 LAB — GLUCOSE, CAPILLARY
Glucose-Capillary: 129 mg/dL — ABNORMAL HIGH (ref 70–99)
Glucose-Capillary: 127 mg/dL — ABNORMAL HIGH (ref 70–99)
Glucose-Capillary: 131 mg/dL — ABNORMAL HIGH (ref 70–99)
Glucose-Capillary: 140 mg/dL — ABNORMAL HIGH (ref 70–99)
Glucose-Capillary: 181 mg/dL — ABNORMAL HIGH (ref 70–99)
Glucose-Capillary: 150 mg/dL — ABNORMAL HIGH (ref 70–99)

## 2019-07-13 LAB — TRIGLYCERIDES: Triglycerides: 845 mg/dL — ABNORMAL HIGH (ref <150)

## 2019-07-13 LAB — CALCIUM, IONIZED: Calcium, Ionized, Serum: 4 mg/dL — ABNORMAL LOW (ref 4.5–5.6)

## 2019-07-13 MED ORDER — SODIUM CHLORIDE 0.9 % IV BOLUS: 500.0000 mL | Freq: Once | INTRAVENOUS | Status: DC

## 2019-07-13 MED ORDER — PANTOPRAZOLE SODIUM 40 MG PO PACK
40.0000 mg | PACK | Freq: Every day | ORAL | Status: DC
  Filled 2019-07-13: qty 20

## 2019-07-13 MED ORDER — DEXMEDETOMIDINE HCL IN NACL 400 MCG/100ML IV SOLN
0.0000 ug/kg/h | INTRAVENOUS | Status: DC
  Administered 2019-07-13: 0.6 ug/kg/h via INTRAVENOUS
  Administered 2019-07-13: 0.4 ug/kg/h via INTRAVENOUS
  Filled 2019-07-13 (×4): qty 100

## 2019-07-13 NOTE — Progress Notes (Addendum)
Advanced Heart Failure Rounding Note  PCP-Cardiologist: No primary care provider on file.   Subjective:    Remains on norepi 26 mcg + milrinone 0.125 mcg . Overnight given 500 cc NS.   EEG continuous . Remains sedated.   Swan #s  CVP 10  SVR 608  PA 37/22 CI 3.4  CO 8.14   Cath  Anterior STEMI complicated by VF arrest.   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 Otherwise minimal disease elsewhere.  Successful DES PCI of proximal LAD using resolute Onyx DES 3.5 x 18 mm postdilated to 4.0 mm.  Systolic hypotension with borderline cardiogenic shock with systolic pressures in the 90s over 60s requiring Levophed at 10 mcg/min. -->Cardiac output-index 4.77-2.04; cardiac power 0.8.  No evidence of pulmonary hypertension Objective:   Weight Range: 114.7 kg Body mass index is 32.47 kg/m.   Vital Signs:   Temp:  [94.5 F (34.7 C)-99.1 F (37.3 C)] 98.1 F (36.7 C) (03/12 0600) Pulse Rate:  [60-100] 80 (03/12 0600) Resp:  [23-24] 24 (03/12 0600) BP: (95-128)/(60-78) 105/65 (03/12 0600) SpO2:  [92 %-100 %] 100 % (03/12 0600) FiO2 (%):  [40 %] 40 % (03/12 0311) Weight:  [114.7 kg] 114.7 kg (03/12 0600) Last BM Date: 07/11/19  Weight change: Filed Weights   07/11/19 0227 07/12/19 0500 07/13/19 0600  Weight: 113 kg 105.2 kg 114.7 kg    Intake/Output:   Intake/Output Summary (Last 24 hours) at 07/13/2019 0710 Last data filed at 07/13/2019 0600 Gross per 24 hour  Intake 3829.46 ml  Output 1270 ml  Net 2559.46 ml      Physical Exam   CVP 10  General:  Intubated/sedated.  HEENT: ETT  Neck: Supple. JVP 9-10 Carotids 2+ bilat; no bruits. No lymphadenopathy or thyromegaly appreciated. Cor: PMI nondisplaced. Regular rate & rhythm. No rubs, gallops or murmurs. Lungs: Clear Abdomen: Soft, nontender, nondistended. No hepatosplenomegaly. No bruits or masses. Good bowel sounds. Extremities: No cyanosis, clubbing, rash,  edema. R femoral swan  Neuro: intubated/sedated    Telemetry   SR 60-70s   EKG   n/a  Labs    CBC Recent Labs    07/11/19 0533 07/11/19 0717 07/11/19 1403 07/12/19 0357  WBC 18.8*  --   --  20.5*  NEUTROABS 15.4*  --   --   --   HGB 15.5   < > 14.6 13.2  HCT 46.3   < > 43.0 39.7  MCV 91.9  --   --  92.3  PLT 255  --   --  174   < > = values in this interval not displayed.   Basic Metabolic Panel Recent Labs    07/12/19 0357 07/12/19 1617 07/13/19 0458  NA 136  --  136  K 4.2  --  3.6  CL 106  --  106  CO2 17*  --  19*  GLUCOSE 265*  --  141*  BUN 34*  --  31*  CREATININE 2.08*  --  1.42*  CALCIUM 8.0*  --  7.5*  MG 2.2 2.2  --   PHOS 3.2 3.5  --    Liver Function Tests Recent Labs    07/11/19 0533 07/12/19 0357  AST 851* 709*  ALT 197* 214*  ALKPHOS 69 62  BILITOT 0.9 0.6  PROT 5.5* 5.1*  ALBUMIN 3.2* 2.7*   No results for input(s): LIPASE, AMYLASE in the last 72 hours. Cardiac Enzymes No results for input(s): CKTOTAL, CKMB,  CKMBINDEX, TROPONINI in the last 72 hours.  BNP: BNP (last 3 results) Recent Labs    07/11/19 0533  BNP 23.3    ProBNP (last 3 results) No results for input(s): PROBNP in the last 8760 hours.   D-Dimer No results for input(s): DDIMER in the last 72 hours. Hemoglobin A1C Recent Labs    07/11/19 0307  HGBA1C 5.4   Fasting Lipid Panel Recent Labs    07/11/19 0307 07/11/19 0307 07/13/19 0458  CHOL 226*  --   --   HDL 34*  --   --   LDLCALC 132*  --   --   TRIG 298*   < > 845*  CHOLHDL 6.6  --   --    < > = values in this interval not displayed.   Thyroid Function Tests Recent Labs    07/11/19 0706  TSH 2.711    Other results:   Imaging    Overnight EEG with video  Result Date: 07/12/2019 Edward Havens, MD     07/12/2019 11:56 AM Patient Name: ROLLYN Mendoza MRN: [id number] Epilepsy Attending: Lora Mendoza Referring Physician/Provider: Dr Glenetta Hew Duration: 07/11/2019 0924 to  07/12/2019 0924 Total Study duration: 24 hours  Patient history: 49yo M who presented with cardiac arrest on TTM. EEG to evaluate for seizure  Level of alertness: comatose  AEDs during EEG study: LEV, propofol  Technical aspects: This EEG study was done with scalp electrodes positioned according to the 10-20 International system of electrode placement. Electrical activity was acquired at a sampling rate of _0  and reviewed with a high frequency filter of _1  and a low frequency filter of _2 . EEG data were recorded continuously and digitally stored.  DESCRIPTION: EEG showed generalized 13-_3  sharply contoured beta activity admixed with polymorphic 6-_4  delta activity. Brief periods of generalized eeg attenuation lasting 2-3 seconds was also noted. EEG was not reactive to tactile stimulation. Hyperventilation and photic stimulation were not performed. Event button was pressed on 07/12/2019 1543, 1615 and 1630 for arm twitching. Concomitant eeg before, during and after the event didn't show any change to suggest seizure.  ABNORMALITY - Continuous slow, generalized - Excessive beta, generalized - Background attenuation, generalized  IMPRESSION: This study is suggestive of profound diffuse encephalopathy, non specific to etiology. No seizures or epileptiform discharges were seen throughout the recording.  Event button was pressed as describe above without eeg change and were likely not epileptic. Edward Mendoza      Medications:     Scheduled Medications: . aspirin  81 mg Per Tube Daily  . busPIRone  30 mg Per Tube BID  . chlorhexidine gluconate (MEDLINE KIT)  15 mL Mouth Rinse BID  . Chlorhexidine Gluconate Cloth  6 each Topical Daily  . feeding supplement (PRO-STAT SUGAR FREE 64)  30 mL Per Tube BID  . feeding supplement (VITAL HIGH PROTEIN)  1,000 mL Per Tube Q24H  . heparin  5,000 Units Subcutaneous Q8H  . insulin aspart  0-9 Units Subcutaneous Q4H  . mouth rinse  15 mL Mouth Rinse 10  times per day  . pantoprazole sodium  40 mg Per Tube Daily  . sodium chloride flush  10-40 mL Intracatheter Q12H  . sodium chloride flush  3 mL Intravenous Q12H  . ticagrelor  90 mg Per Tube BID     Infusions: . sodium chloride Stopped (07/11/19 0649)  . sodium chloride 20 mL/hr at 07/13/19 0600  . amiodarone 30 mg/hr (07/13/19 0600)  . ampicillin-sulbactam (UNASYN)  IV Stopped (07/13/19 0510)  . fentaNYL infusion INTRAVENOUS 300 mcg/hr (07/13/19 0076)  . levETIRAcetam Stopped (07/13/19 0536)  . milrinone 0.125 mcg/kg/min (07/13/19 0600)  . norepinephrine (LEVOPHED) Adult infusion 26 mcg/min (07/13/19 0600)  . propofol (DIPRIVAN) infusion 40 mcg/kg/min (07/13/19 0640)  . sodium chloride       PRN Medications:  sodium chloride, acetaminophen (TYLENOL) oral liquid 160 mg/5 mL, acetaminophen, midazolam, ondansetron (ZOFRAN) IV, sodium chloride flush, sodium chloride flush    Assessment/Plan   1. Witnessed Out of Hospital VF Arrest:  - Witnessed by EMS w/ prompt initiation of ACLS - Estimated 40-50 min resuccitation time - Secondary to acute anterior STEMI w/ proximal LAD occlusion, s/p PCI - EF 15% - On hypothermia protocol/intubated.  - Maintaining SR . Continue amio 30 mg per hour.  - Keep K >4.0 and Mg >2.0  - Will wait to see if any significant neurological recovery . EEG ongoing - Remains on milrinone 0.125 mcg + norepi 26 mcg. Cardiac Output improved.  - if he survives, will need LifeVest at d/c   2. Anterior STEMI - LHC showed 99% proximal LAD thrombotic occlusion, s/p PCI + DES placement. No other significant disease - EF 15%.  - Continue DAPT w/ ASA + Brilinta  - no statin currently given shock liver - no ? blocker w/ low output  3. Cardiogenic Shock  - EF 15%. RV systolic function mildly reduced  - CO/CI at time of cath by Fick: 4.77/2.04 -Remains on milrinone 0.125 mcg + norepi 26 mcg. CO-OX 71%. CO/CI  improved. SVR 608 . ? Add vasopressin or switch to  dobutamine - Currently on NE 26 mcg/min. MAPs 80s  4. Post MI Atrial Fibrillation: - Maintaining NSR. Continue amio drip 30 mg per hour.    5. Shock Liver:  - 2/2 Acute MI/ cardiogenic shock  - Remain elevated. Follow daily.   6. AKI:  - SCr trending down.  1.00>>1.65>>2.00>>1.42   - 2/2 low output/ cardiorenal  - Continue NE. - Urine output trending down.   7. Leukocytosis: -WBCs 21->18->20K - ? Reactive    Length of Stay: 2  Amy Clegg, NP  07/13/2019, 7:10 AM  Advanced Heart Failure Team Pager [phone] (M-F; [place])  Please contact [facility] for night-coverage after hours (4p -7a ) and weekends on [email or web address]  Agree with above.  Patient seen this morning on a.m. rounds remained intubated and sedated on cooling protocol.  Cardiac output was improved with milrinone and Levophed support.  Luiz Blare numbers reviewed personally.  Status was mildly elevated.  This afternoon patient awoke on vent and self extubated.  He is now maintaining his saturations on BiPAP.  On exam On BiPAP HEENT: normal Neck: supple.  JVP to jaw carotids 2+ bilat; no bruits. No lymphadenopathy or thryomegaly appreciated. Cor: PMI nondisplaced. Regular rate & rhythm.  Plus S3 Lungs: Coarse Abdomen: soft, nontender, nondistended. No hepatosplenomegaly. No bruits or masses. Good bowel sounds. Extremities: no cyanosis, clubbing, rash, edema right femoral Luiz Blare looks okay. Neuro: Awake on BiPAP.  Following commands.  Remains critically ill on norepinephrine and milrinone.  He has woken up and self extubated himself.  Currently maintaining his oxygenation on BiPAP.  We will continue this.  We will follow closely.  Continue to titrate his inotrope support.  Hopefully we can stabilize him from a cardiac perspective without the need for mechanical support.  CRITICAL CARE Performed by: Glori Bickers  Total critical care time: 35 minutes  Critical care time was exclusive  of separately billable  procedures and treating other patients.  Critical care was necessary to treat or prevent imminent or life-threatening deterioration.  Critical care was time spent personally by me (independent of midlevel providers or residents) on the following activities: development of treatment plan with patient and/or surrogate as well as nursing, discussions with consultants, evaluation of patient's response to treatment, examination of patient, obtaining history from patient or surrogate, ordering and performing treatments and interventions, ordering and review of laboratory studies, ordering and review of radiographic studies, pulse oximetry and re-evaluation of patient's condition.  Glori Bickers, MD  7:48 PM

## 2019-07-13 NOTE — Progress Notes (Signed)
eLink Physician-Brief Progress Note Patient Name: Edward Mendoza DOB: 1971-02-09 MRN: [id number]   Date of Service  07/13/2019  HPI/Events of Note  Oliguria - CVP = 9 and LVEF = 15%.  eICU Interventions  Will order: 1. Will bolus with 0.9 NaCl 500 mL IV over 30 minutes now.      Intervention Category Major Interventions: Other:  [name] 07/13/2019, 4:49 AM

## 2019-07-13 NOTE — Progress Notes (Signed)
Pt self extubated, placed on a NRB. Pt does follow commands. Dr. Tonia Brooms paged, MD at bedside. ABG ordered for 1 hr. MD d/c orders for TTM/Normothermia. RN removed pts cooling pads. RN will continue to monitor.

## 2019-07-13 NOTE — Progress Notes (Signed)
Called pts wife Marcelino Duster to update her on pt events of self-extubation, flexiseal removed, and TTM protocol stopped. Wife appreciative and receptive to information. RN will continue to monitor.

## 2019-07-13 NOTE — Procedures (Addendum)
Patient Name:Edward Mendoza TVN:[id number] Epilepsy Attending:Talynn Lebon Annabelle Harman Referring Physician/Provider:Dr Bryan Lemma Duration: 07/12/2019 0924 to 07/13/2019 0924 Total Study duration: 48 hours  Patient history:48yo M who presented with cardiac arrest on TTM. EEG to evaluate for seizure  Level of alertness:comatose  AEDs during EEG study:LEV  Technical aspects: This EEG study was done with scalp electrodes positioned according to the 10-20 International system of electrode placement. Electrical activity was acquired at a sampling rate of 500Hz  and reviewed with a high frequency filter of 70Hz  and a low frequency filter of 1Hz . EEG data were recorded continuously and digitally stored.  DESCRIPTION: EEG showed generalized 13-15Hz  sharply contoured beta activity admixed with polymorphic 6-7hz  theta  activity. EEG was reactive to tactile stimulation.Hyperventilation and photic stimulation were not performed.  Event button was pressed on 07/12/2019 1243 for shaking and biting on ETT. Concomitant eeg before, during and after the event didn't show any change to suggest seizure.   ABNORMALITY - Continuous slow, generalized - Excessive beta, generalized  IMPRESSION: This study issuggestive of severe to profound diffuse encephalopathy, non specific to etiology.No seizures or epileptiform discharges were seen throughout the recording.  Event button was pressed as describe above without eeg change and were likely not epileptic.  Samantha Olivera 

## 2019-07-13 NOTE — Progress Notes (Signed)
Fentanyl gtt 200 ml wasted at this time with Tedra Coupe, RN

## 2019-07-13 NOTE — Progress Notes (Signed)
RT responded to patient's room due to self extubation. Patient was given blow by with AMBU bag & then placed on a NRB. SAT's maintained at 95-100%. CCM was notified and came to the bedside. Decision was made to hold off on reintubation due to patient's ability to protect his airway & cough on command to clear secretions. Vent is in the room on standby if needed.

## 2019-07-13 NOTE — Progress Notes (Signed)
EEG maint complete. Re gelled leads. Checked electrodes Fp1 Fp2 F7 p8 o1. No skin breakdown

## 2019-07-13 NOTE — Progress Notes (Signed)
NAME:  Edward Mendoza, MRN:  [id number], DOB:  December 18, 1970, LOS: 2 ADMISSION DATE:  07/11/2019, CONSULTATION DATE:   REFERRING MD:  Dr. Scarlette Calico , CHIEF COMPLAINT:  Cardiac arrest  Brief History    This is a 49 year old male who presents sp cardaic arrest from V fib and V tach noted to have anterior STEMI. SP cath  History of present illness    This is a 49 yo male with no prior medical history who presents with intermittent chest pain since Saturday. He called EMS. They arrived. Shortly after EMS arrived to patient he went into witnessed VF arrest and resuscitation ensued immediately. He received 6 defibrillations and 2 mg of epi. He had a total of 45-50 minutes of compressions. ROSC was achieved shortly after patient arrived to the ED. After ROSC achieved EKG noted anterior STEMI. Patient transferred here for intervention. In our cath lab patient noted to proximal LAD occlusion. He received PCI  To LAD. Right heart cath was also performed that noted the following pressures. No fevers chills or signs of illness recently.   Coronary angiography demonstrated 99% proximal-LAD occlusion. He received PCI with 3.5 x 18 Onyx DES. Right heart cath demonstrated mildly elevated filling pressures (RA 12, PCW 23) and preserve cardiac output with MVO2 67%. Very active at baseline. Current PPD smoker currently.   Past Medical History  None per chart review unable to reach the wife to confirm  Significant Hospital Events   Cath performed with stent placement to LAD  Consults:  PCCM  Procedures:  Swan 3/10 Inutabtion 07/11/19 PCI 07/11/19  Significant Diagnostic Tests:   CT head  07/11/19> R cerebellar infarct which appears remote or subacute in nature.   Micro Data:  Covid negative on 07/11/19  Antimicrobials:  None  Interim history/subjective:   Patient remains critically ill.  Intubated on mechanical life support.  TTM management in place.  Remains on vasopressors and milrinone.  Objective    Blood pressure 135/79, pulse 100, temperature 98.2 F (36.8 C), resp. rate (!) 24, height 6\' 2"  (1.88 m), weight 114.7 kg, SpO2 97 %. PAP: (27-50)/(13-29) 35/22 CVP:  [0 mmHg-18 mmHg] 9 mmHg CO:  [8.1 L/min] 8.1 L/min CI:  [3.4 L/min/m2] 3.4 L/min/m2  Vent Mode: PRVC FiO2 (%):  [40 %] 40 % Set Rate:  [24 bmp] 24 bmp Vt Set:  [650 mL] 650 mL PEEP:  [5 cmH20] 5 cmH20 Plateau Pressure:  [20 cmH20-24 cmH20] 24 cmH20   Intake/Output Summary (Last 24 hours) at 07/13/2019 1116 Last data filed at 07/13/2019 1100 Gross per 24 hour  Intake 4503.36 ml  Output 1065 ml  Net 3438.36 ml   Filed Weights   07/11/19 0227 07/12/19 0500 07/13/19 0600  Weight: 113 kg 105.2 kg 114.7 kg    Examination: General: Intubated critically ill mechanical life support, TTM management in place HENT: NCAT, sclera clear endotracheal tube in place Lungs: Bilateral mechanically ventilated breath sounds Cardiovascular: Regular rate rhythm S1-S2 Abdomen: Soft, nontender nondistended Extremities: No obvious rash Neuro: Awake alert off sedation moving commands moving extremities GU: Foley in place Skin: Cooling plans in place  Resolved Hospital Problem list   NA  Assessment & Plan:   VT/ VF cardiac arrest 2/2 anterior STEMI s/p PCI w/ pLAD DES  -downtime > 40+ minutes Atrial fibrillation, intermittent  Elevated APTT Cardiogenic shock, estimated EF 10% P:  Norepinephrine, milrinone Trend coox Appreciate heart failure service and cardiology services Continue aspirin Brilinta  Acute encephalopathy post cardiac arrest  Off sedation this morning following commands Moves extremities to command, opens eyes to commands Encephalopathy resolving Remains on PAD protocol   Acute respiratory failure in setting of cardiac arrest  P:  Decreasing sedation Remains on full adult mechanical vent support  AKI, improving  -liekly in setting of hypoperfusion  Mixed respiratory / metabolic NGMA P:  Follow I's and  O's Follow urine output Follow BMP  Hypocalcemia iCal 1.09 Hypokalemia P Replete as needed  Elevated LFTs - likely in setting of hypoperfusion  P:  Follow LFTs, likely from shock state  Hyperglycemia  P:  CBGs with SSI  Leukocytosis  - likely reactive. PCT reassuring  P:  Continue to follow  Inadequate PO intake -intubated P Tube feeds  Best practice:  Diet: EN per RDN  Pain/Anxiety/Delirium protocol (if indicated): propofol/ fentanyl, RASS goal -1 to -2 VAP protocol (if indicated): yes DVT prophylaxis: heparin SQ GI prophylaxis: PPI Glucose control: SSI sensitive  Mobility: BR Code Status: Full  Family Communication:  Updated patient's wife at bedside Disposition:  2H ICU    Labs   CBC: Recent Labs  Lab 07/11/19 0149 07/11/19 0238 07/11/19 0310 07/11/19 0533 07/11/19 0717 07/11/19 1403 07/12/19 0357  WBC 27.3*  --   --  18.8*  --   --  20.5*  NEUTROABS  --   --   --  15.4*  --   --   --   HGB 15.6   < > 15.3 15.5 14.6 14.6 13.2  HCT 47.6   < > 45.0 46.3 43.0 43.0 39.7  MCV 94.8  --   --  91.9  --   --  92.3  PLT 269  --   --  255  --   --  174   < > = values in this interval not displayed.    Basic Metabolic Panel: Recent Labs  Lab 07/11/19 0149 07/11/19 0238 07/11/19 0533 07/11/19 0533 07/11/19 0717 07/11/19 1138 07/11/19 1403 07/11/19 1700 07/12/19 0357 07/12/19 1617 07/13/19 0458  NA 135   < > 139   < > 138  --  137 139 136  --  136  K 4.4   < > 3.3*   < > 3.6  --  6.0* 5.6* 4.2  --  3.6  CL 104   < > 108  --  109  --   --  111 106  --  106  CO2 17*  --  20*  --   --   --   --  19* 17*  --  19*  GLUCOSE 249*   < > 166*  --  148*  --   --  120* 265*  --  141*  BUN 21*   < > 23*  --  25*  --   --  31* 34*  --  31*  CREATININE 1.41*   < > 1.18  --  1.00  --   --  1.65* 2.08*  --  1.42*  CALCIUM 8.4*  --  7.6*  --   --   --   --  7.5* 8.0*  --  7.5*  MG  --   --  2.6*  --   --  2.5*  --  2.2 2.2 2.2  --   PHOS  --   --  2.6  --    --  2.2*  --  2.9 3.2 3.5  --    < > = values in this interval not displayed.  GFR: Estimated Creatinine Clearance: 87.5 mL/min (A) (by C-G formula based on SCr of 1.42 mg/dL (H)). Recent Labs  Lab 07/11/19 0149 07/11/19 0533 07/11/19 0538 07/12/19 0357  PROCALCITON  --  0.80  --  4.83  WBC 27.3* 18.8*  --  20.5*  LATICACIDVEN  --   --  1.8  --     Liver Function Tests: Recent Labs  Lab 07/11/19 0149 07/11/19 0533 07/12/19 0357  AST 483* 851* 709*  ALT 142* 197* 214*  ALKPHOS 87 69 62  BILITOT 0.9 0.9 0.6  PROT 6.3* 5.5* 5.1*  ALBUMIN 3.6 3.2* 2.7*   No results for input(s): LIPASE, AMYLASE in the last 168 hours. No results for input(s): AMMONIA in the last 168 hours.  ABG    Component Value Date/Time   PHART 7.393 07/11/2019 1403   PCO2ART 26.7 (L) 07/11/2019 1403   PO2ART 101.0 07/11/2019 1403   HCO3 16.5 (L) 07/11/2019 1403   TCO2 17 (L) 07/11/2019 1403   ACIDBASEDEF 7.0 (H) 07/11/2019 1403   O2SAT 71.0 07/13/2019 0458     Coagulation Profile: Recent Labs  Lab 07/11/19 0149 07/11/19 0538  INR 1.1 1.2    Cardiac Enzymes: No results for input(s): CKTOTAL, CKMB, CKMBINDEX, TROPONINI in the last 168 hours.  HbA1C: Hgb A1c MFr Bld  Date/Time Value Ref Range Status  07/11/2019 03:07 AM 5.4 4.8 - 5.6 % Final    Comment:    (NOTE) Pre diabetes:          5.7%-6.4% Diabetes:              >6.4% Glycemic control for   <7.0% adults with diabetes     CBG: Recent Labs  Lab 07/12/19 1530 07/12/19 1928 07/12/19 2325 07/13/19 0337 07/13/19 0738  GLUCAP 138* 120* 124* 129* 127*    This patient is critically ill with multiple organ system failure; which, requires frequent high complexity decision making, assessment, support, evaluation, and titration of therapies. This was completed through the application of advanced monitoring technologies and extensive interpretation of multiple databases. During this encounter critical care time was devoted to  patient care services described in this note for 33 minutes.  Josephine Igo, DO [facility] 07/13/2019 11:17 AM

## 2019-07-13 NOTE — Progress Notes (Signed)
RN removed pts flexiseal at this time due to pt discomfort and agitation. Precedex gtt also initiated at 0.64mcg. RN will continue to monitor.

## 2019-07-13 NOTE — Progress Notes (Signed)
ADDENDUM:  [place] [facility] Shock Patient Intake Sheet  1. Complete this form for all MI patients presenting with Cardiogenic Shock. 2. This form must be completed by a Cath Lab Super-Tech or Interventionalist. 3. Once completed please EPIC message Philemon Kingdom J   1. Inclusion criteria:  Choose all that apply:  [x]  Symptoms of acute myocardial infarction with ECG and/biomarker evidence of S-T elevation myocardial infarction or non-S-T myocardial infarction.  [x]  Systolic blood pressure < 11mmHg at baseline OR use of inotopes or vasopressors to maintain SBP >36mmHg + LVEDP >=56mmHg  [x]  Evidence of end organ hypoperfusion  []  Patient undergoes PCI  2. Exclusion criteria:  Was there a reason to exclude patient from the Clinical Shock Protocol?     (if YES, check reason and rest of form will not need to be filled out)           []  Evidence of anoxic brain injury           []  Unwitnessed out of hospital cardiac arrest or any       cardiac arrest in which return of spontaneous circulation (ROSC) is not achieved in 30 minutes.           []  IABP placed prior to Impella           []  Patient already supported with an Impella           []  Septic, anaphylactic and hemorrhage causes of shock           []  Neurologic and Non-ischemic cause of shock/hypotension (pulmonary embolism, pneumothorax, myocarditis, tamponade, etc.)           []  Active bleeding for which mechanical circulatory   support is contraindicated           []  Recent major surgery for which mechanical circulatory support is contraindicated            []  Mechanical complications of AMI (acute ventricular septal defect (VSD) or acute papillary muscle rupture)           []  Known left ventricular thrombus for which mechanical circulatory support is contraindicated           []  Mechanical aortic prosthetic valve           []  Contraindication to intravenous systemic anticoagulation  []  Yes            [x]  No    3.  Was LVEDP obtained before PCI and Impella?  []  Yes            []  No  If obtained pre PCI/Impella was it over 15 mm? []  Yes            []  No  4.  Was "Severe Shock" identified prior PCI? If YES, select which item was present to identify "severe shock")  []  SBP <2mm  []  High dose pressors  []  Shock with SBP 80-90 or low dose pressors only, but with with EF <30% in anterior STEMI or Prox LAD or Left Main  []  Shock with SBP 80-90 or only on low pressors but with EF <20% in interior or lateral MI or RCA or Circ as culpit lesion.  []  Impella placed   []  pre PCI                               []  post PCI  []  Was Right heart cath performed prior to leaving  cath lab?                  []  YES        []  NO  []  Yes            [x]  No  5.  "Not Severe Shock" identified prior to PCI?  If NO, then patient is presumed to have had a "severe shock" and rest of questions do not need to be answered)  a. If YES was impella placed? [x]  Yes            []  No  b. If Impella was placed post PCI, was the patient still in Shock before Impella was placed? []  Yes            []  No  c. Was Right heart cath performed before Impella placement? []  Yes            []  No  d. If YES, what were the following values pre-Impella placement? Cardiac Index: Click or tap here to enter text. Cardiac Power: Click or tap here to enter text.  e. If Impella placed, state why.  []  Patient deteriorated into "severe shock" post PCI as defined by criteria of the [place] [facility].  []  CI or CPO Low post PCI by RCA  [x]  Other, (please briefly explain to the right ? If other, briefly explain here:   rhc #S DID NOT SUGGEST SEVERE SHOCK -- NO iabp OR iMPELLA pLACED.    , MD

## 2019-07-14 ENCOUNTER — Inpatient Hospital Stay (HOSPITAL_COMMUNITY): Payer: Managed Care, Other (non HMO)

## 2019-07-14 DIAGNOSIS — I469 Cardiac arrest, cause unspecified: Secondary | ICD-10-CM

## 2019-07-14 LAB — BASIC METABOLIC PANEL
Anion gap: 10 (ref 5–15)
BUN: 36 mg/dL — ABNORMAL HIGH (ref 6–20)
CO2: 21 mmol/L — ABNORMAL LOW (ref 22–32)
Calcium: 7.4 mg/dL — ABNORMAL LOW (ref 8.9–10.3)
Chloride: 106 mmol/L (ref 98–111)
Creatinine, Ser: 1.14 mg/dL (ref 0.61–1.24)
GFR calc Af Amer: >60 mL/min (ref >60)
GFR calc non Af Amer: >60 mL/min (ref >60)
Glucose, Bld: 167 mg/dL — ABNORMAL HIGH (ref 70–99)
Potassium: 3.7 mmol/L (ref 3.5–5.1)
Sodium: 137 mmol/L (ref 135–145)

## 2019-07-14 LAB — COOXEMETRY PANEL
Carboxyhemoglobin: 1 % (ref 0.5–1.5)
Methemoglobin: 1.4 % (ref 0.0–1.5)
O2 Saturation: 63.5 %
Total hemoglobin: 10.7 g/dL — ABNORMAL LOW (ref 12.0–16.0)

## 2019-07-14 LAB — POCT I-STAT 7, (LYTES, BLD GAS, ICA,H+H)
Acid-base deficit: 3 mmol/L — ABNORMAL HIGH (ref 0.0–2.0)
Acid-base deficit: 1 mmol/L (ref 0.0–2.0)
Bicarbonate: 21.9 mmol/L (ref 20.0–28.0)
Bicarbonate: 25.8 mmol/L (ref 20.0–28.0)
Bicarbonate: 25.1 mmol/L (ref 20.0–28.0)
Calcium, Ion: 1.11 mmol/L — ABNORMAL LOW (ref 1.15–1.40)
Calcium, Ion: 1.13 mmol/L — ABNORMAL LOW (ref 1.15–1.40)
Calcium, Ion: 1.11 mmol/L — ABNORMAL LOW (ref 1.15–1.40)
HCT: 27 % — ABNORMAL LOW (ref 39.0–52.0)
HCT: 31 % — ABNORMAL LOW (ref 39.0–52.0)
HCT: 28 % — ABNORMAL LOW (ref 39.0–52.0)
Hemoglobin: 9.2 g/dL — ABNORMAL LOW (ref 13.0–17.0)
Hemoglobin: 10.5 g/dL — ABNORMAL LOW (ref 13.0–17.0)
Hemoglobin: 9.5 g/dL — ABNORMAL LOW (ref 13.0–17.0)
O2 Saturation: 99 %
O2 Saturation: 96 %
O2 Saturation: 100 %
Patient temperature: 36.4
Patient temperature: 36.7
Patient temperature: 36.8
Potassium: 3.7 mmol/L (ref 3.5–5.1)
Potassium: 3.5 mmol/L (ref 3.5–5.1)
Potassium: 3.6 mmol/L (ref 3.5–5.1)
Sodium: 138 mmol/L (ref 135–145)
Sodium: 139 mmol/L (ref 135–145)
Sodium: 138 mmol/L (ref 135–145)
TCO2: 23 mmol/L (ref 22–32)
TCO2: 27 mmol/L (ref 22–32)
TCO2: 26 mmol/L (ref 22–32)
pCO2 arterial: 35.8 mmHg (ref 32.0–48.0)
pCO2 arterial: 49.1 mmHg — ABNORMAL HIGH (ref 32.0–48.0)
pCO2 arterial: 41.1 mmHg (ref 32.0–48.0)
pH, Arterial: 7.392 (ref 7.350–7.450)
pH, Arterial: 7.326 — ABNORMAL LOW (ref 7.350–7.450)
pH, Arterial: 7.393 (ref 7.350–7.450)
pO2, Arterial: 125 mmHg — ABNORMAL HIGH (ref 83.0–108.0)
pO2, Arterial: 90 mmHg (ref 83.0–108.0)
pO2, Arterial: 190 mmHg — ABNORMAL HIGH (ref 83.0–108.0)

## 2019-07-14 LAB — PHOSPHORUS
Phosphorus: 3.8 mg/dL (ref 2.5–4.6)
Phosphorus: 1.5 mg/dL — ABNORMAL LOW (ref 2.5–4.6)

## 2019-07-14 LAB — CBC
HCT: 34.4 % — ABNORMAL LOW (ref 39.0–52.0)
Hemoglobin: 11.3 g/dL — ABNORMAL LOW (ref 13.0–17.0)
MCH: 31.4 pg (ref 26.0–34.0)
MCHC: 32.8 g/dL (ref 30.0–36.0)
MCV: 95.6 fL (ref 80.0–100.0)
Platelets: 102 10*3/uL — ABNORMAL LOW (ref 150–400)
RBC: 3.6 MIL/uL — ABNORMAL LOW (ref 4.22–5.81)
RDW: 12.8 % (ref 11.5–15.5)
WBC: 13.8 10*3/uL — ABNORMAL HIGH (ref 4.0–10.5)
nRBC: 0 % (ref 0.0–0.2)

## 2019-07-14 LAB — GLUCOSE, CAPILLARY
Glucose-Capillary: 152 mg/dL — ABNORMAL HIGH (ref 70–99)
Glucose-Capillary: 117 mg/dL — ABNORMAL HIGH (ref 70–99)
Glucose-Capillary: 99 mg/dL (ref 70–99)
Glucose-Capillary: 106 mg/dL — ABNORMAL HIGH (ref 70–99)
Glucose-Capillary: 130 mg/dL — ABNORMAL HIGH (ref 70–99)

## 2019-07-14 LAB — ECHOCARDIOGRAM LIMITED
Height: 74 in
Weight: 3820.13 oz

## 2019-07-14 LAB — MAGNESIUM
Magnesium: 2.4 mg/dL (ref 1.7–2.4)
Magnesium: 2.3 mg/dL (ref 1.7–2.4)

## 2019-07-14 LAB — TROPONIN I (HIGH SENSITIVITY)
Troponin I (High Sensitivity): >27000 ng/L (ref <18)
Troponin I (High Sensitivity): >27000 ng/L (ref <18)

## 2019-07-14 LAB — TRIGLYCERIDES: Triglycerides: 297 mg/dL — ABNORMAL HIGH (ref <150)

## 2019-07-14 LAB — PROCALCITONIN: Procalcitonin: 2.38 ng/mL

## 2019-07-14 MED ORDER — PROPOFOL 1000 MG/100ML IV EMUL
5.0000 ug/kg/min | INTRAVENOUS | Status: DC
  Administered 2019-07-14: 30 ug/kg/min via INTRAVENOUS
  Administered 2019-07-14 (×4): 40 ug/kg/min via INTRAVENOUS
  Administered 2019-07-15: 35 ug/kg/min via INTRAVENOUS
  Administered 2019-07-15 (×3): 40 ug/kg/min via INTRAVENOUS
  Administered 2019-07-15: 30.779 ug/kg/min via INTRAVENOUS
  Administered 2019-07-15 – 2019-07-16 (×3): 40 ug/kg/min via INTRAVENOUS
  Filled 2019-07-14 (×7): qty 100
  Filled 2019-07-14: qty 200
  Filled 2019-07-14 (×2): qty 100
  Filled 2019-07-14: qty 200

## 2019-07-14 MED ORDER — ORAL CARE MOUTH RINSE
15.0000 mL | Freq: Two times a day (BID) | OROMUCOSAL | Status: DC
  Administered 2019-07-14 (×2): 15 mL via OROMUCOSAL

## 2019-07-14 MED ORDER — VANCOMYCIN HCL 2000 MG/400ML IV SOLN
2000.0000 mg | Freq: Once | INTRAVENOUS | Status: AC
  Administered 2019-07-14: 2000 mg via INTRAVENOUS
  Filled 2019-07-14: qty 400

## 2019-07-14 MED ORDER — POTASSIUM CHLORIDE 20 MEQ/15ML (10%) PO SOLN: 20.0000 meq | Freq: Two times a day (BID) | ORAL | Status: DC

## 2019-07-14 MED ORDER — POTASSIUM CHLORIDE CRYS ER 20 MEQ PO TBCR: 20.0000 meq | EXTENDED_RELEASE_TABLET | Freq: Two times a day (BID) | ORAL | Status: DC

## 2019-07-14 MED ORDER — SPIRONOLACTONE 12.5 MG HALF TABLET: 12.5000 mg | ORAL_TABLET | Freq: Every day | ORAL | Status: DC

## 2019-07-14 MED ORDER — SODIUM CHLORIDE 0.9 % IV SOLN
2.0000 g | Freq: Three times a day (TID) | INTRAVENOUS | Status: DC
  Administered 2019-07-14 – 2019-07-15 (×3): 2 g via INTRAVENOUS
  Filled 2019-07-14 (×5): qty 2

## 2019-07-14 MED ORDER — SODIUM CHLORIDE 0.9 % IV SOLN
500.0000 mg | Freq: Every day | INTRAVENOUS | Status: DC
  Filled 2019-07-14: qty 500

## 2019-07-14 MED ORDER — PRO-STAT SUGAR FREE PO LIQD
30.0000 mL | Freq: Two times a day (BID) | ORAL | Status: DC
  Administered 2019-07-14: 30 mL
  Filled 2019-07-14: qty 30

## 2019-07-14 MED ORDER — FUROSEMIDE 10 MG/ML IJ SOLN
40.0000 mg | Freq: Two times a day (BID) | INTRAMUSCULAR | Status: DC
  Administered 2019-07-14 – 2019-07-17 (×6): 40 mg via INTRAVENOUS
  Filled 2019-07-14 (×6): qty 4

## 2019-07-14 MED ORDER — PANTOPRAZOLE SODIUM 40 MG PO PACK
40.0000 mg | PACK | Freq: Every day | ORAL | Status: DC
  Administered 2019-07-14 – 2019-07-16 (×3): 40 mg
  Filled 2019-07-14 (×2): qty 20

## 2019-07-14 MED ORDER — FUROSEMIDE 10 MG/ML IJ SOLN
40.0000 mg | Freq: Once | INTRAMUSCULAR | Status: DC
  Filled 2019-07-14: qty 4

## 2019-07-14 MED ORDER — POTASSIUM CHLORIDE 20 MEQ/15ML (10%) PO SOLN
20.0000 meq | Freq: Two times a day (BID) | ORAL | Status: DC
  Administered 2019-07-14 – 2019-07-16 (×5): 20 meq
  Filled 2019-07-14 (×5): qty 15

## 2019-07-14 MED ORDER — FUROSEMIDE 10 MG/ML IJ SOLN
INTRAMUSCULAR | Status: AC
  Filled 2019-07-14: qty 4

## 2019-07-14 MED ORDER — SODIUM CHLORIDE 0.9 % IV SOLN
1.0000 g | Freq: Every day | INTRAVENOUS | Status: DC
  Filled 2019-07-14: qty 10

## 2019-07-14 MED ORDER — VANCOMYCIN HCL 1500 MG/300ML IV SOLN
1500.0000 mg | Freq: Two times a day (BID) | INTRAVENOUS | Status: DC
  Administered 2019-07-14: 1500 mg via INTRAVENOUS
  Filled 2019-07-14 (×2): qty 300

## 2019-07-14 MED ORDER — VITAL HIGH PROTEIN PO LIQD: 1000.0000 mL | ORAL | Status: DC

## 2019-07-14 MED ORDER — ETOMIDATE 2 MG/ML IV SOLN
20.0000 mg | Freq: Once | INTRAVENOUS | Status: AC
  Administered 2019-07-14: 20 mg via INTRAVENOUS

## 2019-07-14 MED ORDER — DIGOXIN 125 MCG PO TABS: 0.1250 mg | ORAL_TABLET | Freq: Every day | ORAL | Status: DC

## 2019-07-14 MED ORDER — TICAGRELOR 90 MG PO TABS
90.0000 mg | ORAL_TABLET | Freq: Two times a day (BID) | ORAL | Status: DC
  Filled 2019-07-14 (×2): qty 1

## 2019-07-14 MED ORDER — TICAGRELOR 90 MG PO TABS
90.0000 mg | ORAL_TABLET | Freq: Two times a day (BID) | ORAL | Status: DC
  Administered 2019-07-14 – 2019-07-16 (×6): 90 mg
  Filled 2019-07-14 (×5): qty 1

## 2019-07-14 MED ORDER — ROCURONIUM BROMIDE 50 MG/5ML IV SOLN
100.0000 mg | Freq: Once | INTRAVENOUS | Status: AC
  Administered 2019-07-14: 100 mg via INTRAVENOUS

## 2019-07-14 MED ORDER — DIGOXIN 125 MCG PO TABS
0.1250 mg | ORAL_TABLET | Freq: Every day | ORAL | Status: DC
  Administered 2019-07-14 – 2019-07-22 (×9): 0.125 mg
  Filled 2019-07-14 (×9): qty 1

## 2019-07-14 MED ORDER — POTASSIUM CHLORIDE 20 MEQ/15ML (10%) PO SOLN
40.0000 meq | Freq: Once | ORAL | Status: AC
  Administered 2019-07-14: 40 meq via ORAL
  Filled 2019-07-14: qty 30

## 2019-07-14 MED ORDER — SPIRONOLACTONE 12.5 MG HALF TABLET
12.5000 mg | ORAL_TABLET | Freq: Every day | ORAL | Status: DC
  Administered 2019-07-14 – 2019-07-15 (×2): 12.5 mg
  Filled 2019-07-14 (×2): qty 1

## 2019-07-14 MED ORDER — VITAL HIGH PROTEIN PO LIQD
1000.0000 mL | ORAL | Status: DC
  Administered 2019-07-14 – 2019-07-15 (×2): 1000 mL

## 2019-07-14 NOTE — Progress Notes (Addendum)
PCCM Interval progress note:  49 y.o. M admitted three days ago with s/p Vfib arrest and noted to have anterior STEMI, s/p DES who self-extubated yesterday and has been on Precedex 0.6mg .  Earlier this evening patient had been awake and conversational until NG tube placement attempted.  Pt then had an a drop in BP and few second loss of pulses that returned spontaneously.  He has been more somnolent since then and required non-rebreather for oxygen saturations dropping.   On exam, pt is sleeping but [place] arousable to sternal rub and will follow commands. He denies chest pain.   Decreased breath sounds bilateral bases. ABG obtained and showed no significant hypercarbia on bipap, pH 7.31, pO2 67.   Head CT done on admission showed Remote or subacute right cerebellar infarct and CXR with probable pulmonary edema. EF 15%.    Plan: -Lasix 40mg x1 -Continue nasal cannula or non-rebreather oxygen -stat EKG -Stat CT head -decrease Precedex to 0.3mg  -closely monitor, may require re-intubation  Head CT: New wedge-shaped hypodensity involving the parasagittal right occipital lobe, suspicious for an evolving acute ischemic infarct  CXR: Patchy multifocal opacities in the right and left perihilar regions, could reflect developing infection, edema and/or ARDS   4:55 AM  I spoke with neurologist on call, this could be an expected finding in the setting of cardiac arrest and catheterization, recommends MRI and neurology will consult if there is an acute infarct. Start Rocephin and Azithromycin   Marland Kitchen Lorelai Huyser, PA-C

## 2019-07-14 NOTE — Procedures (Addendum)
Patient Name:Edward Mendoza FCZ:[id number] Epilepsy Attending:Kleigh Hoelzer Annabelle Harman Referring Physician/Provider:Dr Bryan Lemma Duration:07/13/2019 0063 to 07/14/2019 1038 Total Study duration:73 hours  Patient history:48yo M who presented with cardiac arrest on TTM. EEG to evaluate for seizure  Level of alertness:awake, lethargic/sedated  AEDs during EEG study:LEV  Technical aspects: This EEG study was done with scalp electrodes positioned according to the 10-20 International system of electrode placement. Electrical activity was acquired at a sampling rate of 500Hz  and reviewed with a high frequency filter of 70Hz  and a low frequency filter of 1Hz . EEG data were recorded continuously and digitally stored.  DESCRIPTION: During awake state, no clear posterior dominant rhythm was seen. EEG showed generalized polymorphic 3-7hz  theta-delta slowing. EEG was reactive to tactile stimulation.Hyperventilation and photic stimulation were not performed.  ABNORMALITY - Continuous slow, generalized  IMPRESSION: This study issuggestive of moderate to severe diffuse encephalopathy, non specific to etiology.No seizures or epileptiform discharges were seen throughout the recording.  Latwan Luchsinger 

## 2019-07-14 NOTE — Progress Notes (Signed)
NAME:  NTHONY LEFFERTS, MRN:  [id number], DOB:  12/21/1970, LOS: 3 ADMISSION DATE:  07/11/2019, CONSULTATION DATE:   REFERRING MD:  Dr. Launa Grill , CHIEF COMPLAINT:  Cardiac arrest  Brief History    This is a 49 year old male who presents sp cardaic arrest from V fib and V tach noted to have anterior STEMI. SP cath  History of present illness    This is a 49 yo male with no prior medical history who presents with intermittent chest pain since Saturday. He called EMS. They arrived. Shortly after EMS arrived to patient he went into witnessed VF arrest and resuscitation ensued immediately. He received 6 defibrillations and 2 mg of epi. He had a total of 45-50 minutes of compressions. ROSC was achieved shortly after patient arrived to the ED. After ROSC achieved EKG noted anterior STEMI. Patient transferred here for intervention. In our cath lab patient noted to proximal LAD occlusion. He received PCI  To LAD. Right heart cath was also performed that noted the following pressures. No fevers chills or signs of illness recently.   Coronary angiography demonstrated 99% proximal-LAD occlusion. He received PCI with 3.5 x 18 Onyx DES. Right heart cath demonstrated mildly elevated filling pressures (RA 12, PCW 23) and preserve cardiac output with MVO2 67%. Very active at baseline. Current PPD smoker currently.   Past Medical History  None per chart review unable to reach the wife to confirm  [facility] Events   Cath performed with stent placement to LAD  Consults:  PCCM  Procedures:  Swan 3/10 Inutabtion 07/11/19 PCI 07/11/19  Significant Diagnostic Tests:   CT head  07/11/19> R cerebellar infarct which appears remote or subacute in nature.   Micro Data:  Covid negative on 07/11/19  Antimicrobials:  None  Interim history/subjective:   Self extubated yesterday, worsening resp distress and agitation this AM. Re-intubated.  Objective   Blood pressure 111/85, pulse 83, temperature 98.1  F (36.7 C), resp. rate (!) 27, height 6\' 2"  (1.88 m), weight 108.3 kg, SpO2 96 %. PAP: (30-60)/(16-31) 48/27 CVP:  [5 mmHg-23 mmHg] 12 mmHg CO:  [7.8 L/min-8.1 L/min] 7.8 L/min CI:  [3.3 L/min/m2-3.4 L/min/m2] 3.3 L/min/m2  Vent Mode: BIPAP;PCV FiO2 (%):  [40 %-100 %] 100 % Set Rate:  [15 bmp-24 bmp] 15 bmp Vt Set:  [650 mL] 650 mL PEEP:  [5 cmH20] 5 cmH20 Plateau Pressure:  [23 cmH20-24 cmH20] 24 cmH20   Intake/Output Summary (Last 24 hours) at 07/14/2019 0701 Last data filed at 07/14/2019 0600 Gross per 24 hour  Intake 1630.78 ml  Output 450 ml  Net 1180.78 ml   Filed Weights   07/12/19 0500 07/13/19 0600 07/13/19 2000  Weight: 105.2 kg 114.7 kg 108.3 kg    Examination: GEN: ill appearing man on BIPAP HEENT: BIPAP in place good seal CV: Irregular, ext lukewarm PULM: extensive rhonci throughout, + accessory muscle use GI: Soft, hypoactive BS EXT: Trace edema NEURO: Per nursing was following commands earlier in stay PSYCH: RASS +1 SKIN: No rashes  Chemistry benign CT with possible new infarcts CXR multifocal infiltrates  Resolved Hospital Problem list   NA  Assessment & Plan:   VT/ VF cardiac arrest 2/2 anterior STEMI s/p PCI w/ pLAD DES  -downtime > 40+ minutes Atrial fibrillation, intermittent  Elevated APTT Cardiogenic shock, estimated EF 10% P:  Inotropes per CHF team Continue aspirin Brilinta  Acute encephalopathy post cardiac arrest, strokes on CT head Issue despite precedex MRI/EEG per neurology, appreciate help  Can use propofol in interim, may need to try some PO meds going forward   Acute respiratory failure in setting of cardiac arrest, new multifocal infiltrates suspicious for aspiration vs. HCAP P:  Reintubate Vanc/cefepime, Pct, Tracheal aspirate, narrow PRN VAP prevention protocol   AKI, improving  -liekly in setting of hypoperfusion  Mixed respiratory / metabolic NGMA P:  Follow I's and O's Follow urine output Follow  BMP   Shock liver- check AM level  Hyperglycemia  P:  CBGs with SSI  Leukocytosis  - likely reactive. PCT reassuring  P:  Check today  Inadequate PO intake -intubated P Tube feeds  Best practice:  Diet: EN per RDN  Pain/Anxiety/Delirium protocol (if indicated): propofol/ fentanyl, RASS goal -1 to -2 VAP protocol (if indicated): yes DVT prophylaxis: heparin SQ GI prophylaxis: PPI Glucose control: SSI sensitive  Mobility: BR Code Status: Full  Family Communication:  Will update wife when she comes in Disposition:  2H ICU    This patient is critically ill with multiple organ system failure; which, requires frequent high complexity decision making, assessment, support, evaluation, and titration of therapies. This was completed through the application of advanced monitoring technologies and extensive interpretation of multiple databases. During this encounter critical care time was devoted to patient care services described in this note for 35 minutes.  Lorin Glass, MD [facility] 07/14/2019 7:01 AM

## 2019-07-14 NOTE — Progress Notes (Signed)
Advanced Heart Failure Rounding Note  PCP-Cardiologist: No primary care provider on file.   Subjective:    Events:  3/10 admitted with anterior STEMI and VF arrest with prolonged resuscitation s/p PCI LAD 3/12 self extubated. Developed recurrent respiratory failure and reintubated   Reintubated overnight for recurrent aspiratory failure and near PEA arrest after self extubation.  Currently remains on ventilator at 100% FiO2.  He will withdraw to pain.  EEG shows diffuse slowing but no seizure activity.  Remains on milrinone 0.125 and NE 2. PCT 2.4  HSTroponin remains > 27k  Swan #s done personally this morning CVP 15  PA 45/23 PCWP 20 Thermo 5.8/2.8 SVR 775 Co-ox 64%   Objective:   Weight Range: 108.3 kg Body mass index is 30.65 kg/m.   Vital Signs:   Temp:  [96.8 F (36 C)-98.1 F (36.7 C)] 98.1 F (36.7 C) (03/13 0600) Pulse Rate:  [48-158] 76 (03/13 0700) Resp:  [10-32] 24 (03/13 0700) BP: (88-118)/(61-89) 111/85 (03/13 0600) SpO2:  [84 %-100 %] 95 % (03/13 0658) FiO2 (%):  [100 %] 100 % (03/13 0658) Weight:  [108.3 kg] 108.3 kg (03/13 0500) Last BM Date: 07/13/19  Weight change: Filed Weights   07/13/19 0600 07/13/19 2000 07/14/19 0500  Weight: 114.7 kg 108.3 kg 108.3 kg    Intake/Output:   Intake/Output Summary (Last 24 hours) at 07/14/2019 1203 Last data filed at 07/14/2019 0600 Gross per 24 hour  Intake 897.99 ml  Output 395 ml  Net 502.99 ml      Physical Exam    General: Intubated sedated will respond to pain. HEENT: normal + ETT Neck: supple.  To jaw.. Carotids 2+ bilat; no bruits. No lymphadenopathy or thryomegaly appreciated. Cor: PMI nondisplaced. Regular rate & rhythm. No rubs, gallops or murmurs. Lungs: Coarse Abdomen: soft, nontender, nondistended. No hepatosplenomegaly. No bruits or masses. Good bowel sounds. Extremities: no cyanosis, clubbing, rash, 2-3+ edema right femoral vein Swan-Ganz catheter Neuro: Intubated sedated.   Will respond to pain.   Telemetry   Sinus rhythm 70 to 80s.  Personally reviewed.   Labs    CBC Recent Labs    07/12/19 0357 07/13/19 1616 07/14/19 0234 07/14/19 0854  WBC 20.5*  --   --  13.8*  HGB 13.2   < > 9.2* 11.3*  HCT 39.7   < > 27.0* 34.4*  MCV 92.3  --   --  95.6  PLT 174  --   --  PENDING   < > = values in this interval not displayed.   Basic Metabolic Panel Recent Labs    07/12/19 0357 07/12/19 1617 07/13/19 0458 07/13/19 1616 07/14/19 0234 07/14/19 0432 07/14/19 0854  NA   < >  --  136   < > 138 137  --   K   < >  --  3.6   < > 3.7 3.7  --   CL   < >  --  106  --   --  106  --   CO2   < >  --  19*  --   --  21*  --   GLUCOSE   < >  --  141*  --   --  167*  --   BUN   < >  --  31*  --   --  36*  --   CREATININE   < >  --  1.42*  --   --  1.14  --   CALCIUM   < >  --  7.5*  --   --  7.4*  --   MG  --  2.2  --   --   --   --  2.4  PHOS  --  3.5  --   --   --   --  3.8   < > = values in this interval not displayed.   Liver Function Tests Recent Labs    07/12/19 0357  AST 709*  ALT 214*  ALKPHOS 62  BILITOT 0.6  PROT 5.1*  ALBUMIN 2.7*   No results for input(s): LIPASE, AMYLASE in the last 72 hours. Cardiac Enzymes No results for input(s): CKTOTAL, CKMB, CKMBINDEX, TROPONINI in the last 72 hours.  BNP: BNP (last 3 results) Recent Labs    07/11/19 0533  BNP 23.3    ProBNP (last 3 results) No results for input(s): PROBNP in the last 8760 hours.   D-Dimer No results for input(s): DDIMER in the last 72 hours. Hemoglobin A1C No results for input(s): HGBA1C in the last 72 hours. Fasting Lipid Panel Recent Labs    07/14/19 0854  TRIG 297*   Thyroid Function Tests No results for input(s): TSH, T4TOTAL, T3FREE, THYROIDAB in the last 72 hours.  Invalid input(s): FREET3  Other results:   Imaging    DG Chest 1 View  Result Date: 07/14/2019 CLINICAL DATA:  ETT EXAM: CHEST  1 VIEW COMPARISON:  07/14/2019 at 0224 hours FINDINGS:  Endotracheal tube terminates 5.5 cm above the carina. Enteric tube terminates in the proximal gastric body. Multifocal patchy airspace opacities in the lungs bilaterally, upper lung predominant. No pleural effusion or pneumothorax. The heart is normal in size. Inferior approach Swan-Ganz catheter terminates in the left main pulmonary artery. IMPRESSION: Endotracheal tube terminates 5.5 cm above the carina. Additional support apparatus as above. Multifocal patchy opacities, suggesting multifocal pneumonia, suspicious for infection/aspiration, grossly unchanged. Electronically Signed   By: Julian Hy M.D.   On: 07/14/2019 08:01   CT HEAD WO CONTRAST  Result Date: 07/14/2019 CLINICAL DATA:  Initial evaluation for acute encephalopathy, somnolence. EXAM: CT HEAD WITHOUT CONTRAST TECHNIQUE: Contiguous axial images were obtained from the base of the skull through the vertex without intravenous contrast. COMPARISON:  Prior CT from 07/11/2019. FINDINGS: Brain: Urine volume within normal limits. Chronic appearing right cerebellar infarct again noted. There is a new wedge-shaped focal hypodensity involving the parasagittal right occipital lobe (series 4, image 19), suspicious for an evolving acute ischemic infarct, right PCA territory (series 4, image 19). Additionally, subtle hypodensity seen at the subcortical white matter of the anterior right frontal lobe, age indeterminate, but could also reflect sequelae of evolving ischemia (series 4, image 24). Gray-white matter differentiation otherwise maintained. No intracranial hemorrhage. No mass lesion or midline shift. No hydrocephalus or extra-axial fluid collection. Vascular: No hyperdense vessel. Scattered vascular calcifications noted within the carotid siphons. Skull: Scalp soft tissues within normal limits. Calvarium intact. Sinuses/Orbits: Globes and orbital soft tissues within normal limits. Moderate mucosal thickening in opacity within the sphenoid ethmoidal  sinuses and maxillary sinuses. Trace right mastoid effusion. Other: None. IMPRESSION: 1. New wedge-shaped hypodensity involving the parasagittal right occipital lobe, suspicious for an evolving acute ischemic infarct, right PCA territory. Follow-up examination with dedicated MRI suggested for confirmation. 2. Additional subtle hypodensity at the subcortical white matter of the anterior right frontal lobe, age indeterminate, but could also reflect sequelae of evolving ischemia. 3. Chronic right cerebellar infarct. 4. Moderate paranasal sinus disease. Electronically Signed   By: Pincus Badder.D.  On: 07/14/2019 04:20   DG CHEST PORT 1 VIEW  Result Date: 07/14/2019 CLINICAL DATA:  Cardiac arrest EXAM: PORTABLE CHEST 1 VIEW COMPARISON:  Radiograph 07/12/2019 FINDINGS: Lung volumes are significantly diminished. There are patchy multifocal opacities in the right and left perihilar regions. No pneumothorax or visible effusion. Cardiac prominence likely related to low volumes and portable technique, similar to comparison portable radiographs. No acute osseous or soft tissue abnormality. Removal of the endotracheal and transesophageal tubes as well as a lower extremity approach Swan-Ganz catheterization. IMPRESSION: Patchy multifocal opacities in the right and left perihilar regions, could reflect developing infection, edema and/or ARDS in the setting of resuscitative efforts. Electronically Signed   By: Lovena Le M.D.   On: 07/14/2019 02:45   ECHOCARDIOGRAM LIMITED  Result Date: 07/14/2019    ECHOCARDIOGRAM LIMITED REPORT   Patient Name:   Edward Mendoza Date of Exam: 07/14/2019 Medical Rec #:  [id number]      Height:       74.0 in Accession #:    [id number]     Weight:       238.8 lb Date of Birth:  08-Nov-1970      BSA:          2.343 m Patient Age:    49 years       BP:           111/85 mmHg Patient Gender: M              HR:           74 bpm. Exam Location:  Inpatient Procedure: Limited Echo, Limited  Color Doppler and Cardiac Doppler Indications:    cardiac arrest  History:        Patient has prior history of Echocardiogram examinations, most                 recent 07/11/2019.  Sonographer:    Johny Chess Referring Phys: [id number] KENNETH C HILTY  Sonographer Comments: Echo performed with patient supine and on artificial respirator. IMPRESSIONS  1. Left ventricular ejection fraction, by estimation, is 20 to 25%. The left ventricle has severely decreased function. There is severe hypokinesis of the left ventricular, entire anteroseptal wall, anterior wall and apical segment. There is moderate hypokinesis of the left ventricular, mid-apical inferior wall. Comparison(s): Prior images reviewed side by side. Changes from prior study are noted. 07/11/19: LVEF 15%. FINDINGS  Left Ventricle: Left ventricular ejection fraction, by estimation, is 20 to 25%. The left ventricle has severely decreased function. Severe hypokinesis of the left ventricular, entire anteroseptal wall, anterior wall and apical segment. Moderate hypokinesis of the left ventricular, mid-apical inferior wall. Pericardium: Trivial pericardial effusion is present. The pericardial effusion is posterior to the left ventricle. Venous: IVC assessment for right atrial pressure unable to be performed due to mechanical ventilation.  LEFT VENTRICLE PLAX 2D LVIDd:         5.30 cm      Diastology LVIDs:         3.90 cm      LV e' lateral:   8.70 cm/s LV PW:         1.00 cm      LV E/e' lateral: 5.7 LV IVS:        0.90 cm      LV e' medial:    6.96 cm/s LVOT diam:     2.30 cm      LV E/e' medial:  7.1 LV SV:  44 LV SV Index:   19 LVOT Area:     4.15 cm  LV Volumes (MOD) LV vol d, MOD A2C: 108.0 ml LV vol d, MOD A4C: 96.2 ml LV vol s, MOD A2C: 75.7 ml LV vol s, MOD A4C: 65.9 ml LV SV MOD A2C:     32.3 ml LV SV MOD A4C:     96.2 ml LV SV MOD BP:      34.4 ml LEFT ATRIUM         Index LA diam:    4.10 cm 1.75 cm/m  AORTIC VALVE LVOT Vmax:   62.50 cm/s LVOT  Vmean:  44.500 cm/s LVOT VTI:    0.106 m  AORTA Ao Root diam: 3.20 cm MITRAL VALVE MV Area (PHT): 4.68 cm    SHUNTS MV Decel Time: 162 msec    Systemic VTI:  0.11 m MV E velocity: 49.50 cm/s  Systemic Diam: 2.30 cm MV A velocity: 61.10 cm/s MV E/A ratio:  0.81 Lyman Bishop MD Electronically signed by Lyman Bishop MD Signature Date/Time: 07/14/2019/12:02:06 PM    Final      Medications:     Scheduled Medications:  aspirin  81 mg Per Tube Daily   chlorhexidine gluconate (MEDLINE KIT)  15 mL Mouth Rinse BID   Chlorhexidine Gluconate Cloth  6 each Topical Daily   furosemide       furosemide  40 mg Intravenous Once   heparin  5,000 Units Subcutaneous Q8H   insulin aspart  0-9 Units Subcutaneous Q4H   mouth rinse  15 mL Mouth Rinse 10 times per day   mouth rinse  15 mL Mouth Rinse BID   pantoprazole sodium  40 mg Per Tube Daily   sodium chloride flush  10-40 mL Intracatheter Q12H   sodium chloride flush  3 mL Intravenous Q12H   ticagrelor  90 mg Per Tube BID    Infusions:  sodium chloride Stopped (07/11/19 0649)   sodium chloride 20 mL/hr at 07/13/19 0600   amiodarone 30 mg/hr (07/14/19 1115)   ceFEPime (MAXIPIME) IV 2 g (07/14/19 0852)   dexmedetomidine (PRECEDEX) IV infusion 0.3 mcg/kg/hr (07/14/19 0256)   feeding supplement (VITAL HIGH PROTEIN) 1,000 mL (07/14/19 1100)   levETIRAcetam Stopped (07/13/19 1632)   milrinone 0.125 mcg/kg/min (07/14/19 0000)   norepinephrine (LEVOPHED) Adult infusion 2 mcg/min (07/14/19 1103)   propofol (DIPRIVAN) infusion 40 mcg/kg/min (07/14/19 1100)   sodium chloride     vancomycin      PRN Medications: sodium chloride, acetaminophen (TYLENOL) oral liquid 160 mg/5 mL, acetaminophen, ondansetron (ZOFRAN) IV, sodium chloride flush, sodium chloride flush    Assessment/Plan   1. Witnessed Out of Hospital VF Arrest in setting of anterior STEMI  - Witnessed by EMS w/ prompt initiation of ACLS - Estimated 40-50 min  resuccitation time - Secondary to acute anterior STEMI w/ proximal LAD occlusion, s/p PCI on  - EF 15% - s/p hypothermia  - EEG with diffuse slowing.  Woke up on 3/12 and self extubated.  Was following commands. -Rhythm currently stable.  Keep Mg > 2.0, K > 4.0  2. Anterior STEMI - LHC showed 99% proximal LAD thrombotic occlusion, s/p PCI + DES placement. No other significant disease - EF 15%.  - Continue DAPT w/ ASA + Brilinta  - no statin currently given shock liver.  Then start today. - no ? blocker w/ low output   3. Acute systolic HF -> Cardiogenic Shock  - EF 15%. RV systolic function mildly reduced  -  He remains very tenuous.  Swan numbers improved today but still overloaded. -On milrinone 0.125.  Weaning norepinephrine down to 2. -We will need further diuresis. -Start digoxin and spironolactone. -Repeating echo today.  Persistently elevated troponin is a bad prognostic sign. - Hopefully we can stabilize him from a cardiac perspective without the need for mechanical support.  4. Acute hypoxic respiratory failure. -In setting of VF arrest. -Self extubated 3/12.  Reintubated 3/12 due to recurrent respiratory failure -Now on 100% FiO2 -Chest x-ray today suggestive of multifocal pneumonia.  Suspect aspiration.  Procalcitonin is 2.4.  Broad-spectrum antibiotics have been started. -He will need further diuresis.  Start Lasix 40 IV twice daily.  5. Post MI Atrial Fibrillation: - Maintaining NSR. Continue amio drip 30 mg per hour.  -If recurs will need heparin.  5. Shock Liver:  - 2/2 Acute MI/ cardiogenic shock  - Remain elevated. Follow daily.   6. AKI:  -Improved hemodynamic support.   CRITICAL CARE Performed by: Glori Bickers  Total critical care time: 45 minutes  Critical care time was exclusive of separately billable procedures and treating other patients.  Critical care was necessary to treat or prevent imminent or life-threatening  deterioration.  Critical care was time spent personally by me (independent of midlevel providers or residents) on the following activities: development of treatment plan with patient and/or surrogate as well as nursing, discussions with consultants, evaluation of patient's response to treatment, examination of patient, obtaining history from patient or surrogate, ordering and performing treatments and interventions, ordering and review of laboratory studies, ordering and review of radiographic studies, pulse oximetry and re-evaluation of patient's condition.    Length of Stay: 3  Glori Bickers, MD  07/14/2019, 12:03 PM  Advanced Heart Failure Team Pager [phone] (M-F; [place])  Please contact [facility] for night-coverage after hours (4p -7a ) and weekends on [email or web address]

## 2019-07-14 NOTE — Procedures (Signed)
Intubation Procedure Note MOZES SAGAR [id number] 01/27/1971  Procedure: Intubation Indications: Respiratory insufficiency  Procedure Details Consent: Unable to obtain consent because of emergent medical necessity. Time Out: Verified patient identification, verified procedure, site/side was marked, verified correct patient position, special equipment/implants available, medications/allergies/relevent history reviewed, required imaging and test results available.  Performed  Maximum sterile technique was used including antiseptics, cap, gloves, gown, hand hygiene, mask and sheet.  MAC and 4    Evaluation Hemodynamic Status: BP stable throughout; O2 sats: stable throughout Patient's Current Condition: stable Complications: No apparent complications Patient did tolerate procedure well. Chest X-ray ordered to verify placement.  CXR: pending.   Candee Furbish 07/14/2019

## 2019-07-14 NOTE — CV Procedure (Signed)
Radial arterial line placement.   Indication: shock, VDRF  Emergent consent assumed. The righ wrist was prepped and draped in the routine sterile fashion using u/s guidance several attempts were made to place a radial arterial line in the right radial and the right ulnar arteries. We were able to cannulate the vessel but unable to thread the wire.   The left wrist was then prepped and a single lumen radial arterial catheter was placed in the left radial artery using a modified Seldinger technique and u/s guidance. Good blood flow and wave forms. A dressing was placed.     Arvilla Meres, MD  12:00 PM

## 2019-07-14 NOTE — Progress Notes (Signed)
Pharmacy Antibiotic Note  Edward Mendoza is a 49 y.o. male  with possible PNA.  Pharmacy has been consulted for Vancomycin and Cefepime  dosing.  Plan: Vancomycin 2 g IV now, then 1500 mg IV q12h Est AUC 467 Cefepime 2 g IV q8h  Height: 6\' 2"  (188 cm) Weight: 238 lb 12.1 oz (108.3 kg) IBW/kg (Calculated) : 82.2  Temp (24hrs), Avg:97.5 F (36.4 C), Min:96.8 F (36 C), Max:99.3 F (37.4 C)  Recent Labs  Lab 07/11/19 0149 07/11/19 0149 07/11/19 0533 07/11/19 0533 07/11/19 0538 07/11/19 0717 07/11/19 1700 07/12/19 0357 07/13/19 0458 07/14/19 0432  WBC 27.3*  --  18.8*  --   --   --   --  20.5*  --   --   CREATININE 1.41*   < > 1.18   < >  --  1.00 1.65* 2.08* 1.42* 1.14  LATICACIDVEN  --   --   --   --  1.8  --   --   --   --   --    < > = values in this interval not displayed.    Estimated Creatinine Clearance: 106 mL/min (by C-G formula based on SCr of 1.14 mg/dL).    Not on File   07/16/19 07/14/2019 7:31 AM

## 2019-07-14 NOTE — Progress Notes (Signed)
LTM EEG discontinued - no skin breakdown at unhook.   

## 2019-07-14 NOTE — Progress Notes (Signed)
Nutrition Follow-up / Consult  DOCUMENTATION CODES:   Not applicable  INTERVENTION:   Resume TF via OG tube:  Vital High Protein at 70 ml/h (1680 ml/d)  Provides 1680 kcal (2195 kcal total with propofol), 147 gm protein, 1404 ml free water daily.  NUTRITION DIAGNOSIS:   Inadequate oral intake related to acute illness as evidenced by NPO status.  Ongoing  GOAL:   Patient will meet greater than or equal to 90% of their needs  Progressing  MONITOR:   Vent status, Labs, Weight trends, TF tolerance, Skin  REASON FOR ASSESSMENT:   Consult Enteral/tube feeding initiation and management  ASSESSMENT:   49 yo male admitted post VT/VF cardiac arrest secondary to anterior STEMI, acute encephalopathy and acute respiratory failure post arrest requiring intubation. No PMH  Patient self-extubated 3/12, developed worsening respiratory distress and required re-intubation this morning.   Head CT revealed new infarcts. CXR showed multifocal infiltrates suspicious for aspiration vs HCAP.   Received MD Consult for TF initiation and management. OG tube in place.   Patient is currently intubated on ventilator support MV: 11.3 L/min Temp (24hrs), Avg:97.5 F (36.4 C), Min:96.8 F (36 C), Max:98.2 F (36.8 C)  Propofol: 19.5 ml/hr providing 515 kcal from lipid  Labs reviewed.  CBG's: 181-150-152  Medications reviewed and include lasix, novolog, propofol.  I/O + 9.2 L since admission. Weight is down from 113 kg on initial nutrition assessment 3/10 to 108.3 kg today. Lowest weight 105.2 kg on 3/11 (BMI 29.8).   Diet Order:   Diet Order            Diet NPO time specified  Diet effective now              EDUCATION NEEDS:   Not appropriate for education at this time  Skin:  Skin Assessment: Reviewed RN Assessment  Last BM:  3/12 type 7  Height:   Ht Readings from Last 1 Encounters:  07/11/19 6\' 2"  (1.88 m)    Weight:   Wt Readings from Last 1 Encounters:   07/14/19 108.3 kg    Ideal Body Weight:  86.4 kg  BMI:  Body mass index is 30.65 kg/m.  Estimated Nutritional Needs:   Kcal:  2258  Protein:  135-160 g  Fluid:  >/= 2 L    2259, RD, LDN, CNSC Please refer to Amion for contact information.

## 2019-07-14 NOTE — Progress Notes (Signed)
  Echocardiogram 2D Echocardiogram has been performed.  Edward Mendoza 07/14/2019, 11:33 AM

## 2019-07-14 NOTE — Progress Notes (Signed)
DAILY PROGRESS NOTE   Patient Name: Edward Mendoza Date of Encounter: 07/14/2019 Cardiologist: No primary care provider on file.  Chief Complaint   Agitated overnight  Patient Profile   Edward Mendoza is a 49 y.o. male with no prior medical history who presented with acute STEMI complicated by witnessed VF arrest.  Subjective   Events noted overnight, including agitation, progressive respiratory distress and confusion -placed on bipap and then was ultimately intubated and sedated. On EEG continuous, plan for MRI today. NGT was removed, did not receive Brilinta today- this will be given once his NGT placement is confirmed by x-ray. Given lasix this am -good output. CVP 12-14/. EF 10-15% with large anterior MI and cardiogenic shock - currently on milrinone and amiodarone. Co-ox this am is 63.5.  Objective   Vitals:   07/14/19 0545 07/14/19 0600 07/14/19 0658 07/14/19 0700  BP:  111/85    Pulse: 92 83  76  Resp: (!) 31 (!) 27  (!) 24  Temp: 97.9 F (36.6 C) 98.1 F (36.7 C)    TempSrc:      SpO2: 94% 96% 95%   Weight:      Height:        Intake/Output Summary (Last 24 hours) at 07/14/2019 0747 Last data filed at 07/14/2019 0600 Gross per 24 hour  Intake 1630.78 ml  Output 450 ml  Net 1180.78 ml   Filed Weights   07/12/19 0500 07/13/19 0600 07/13/19 2000  Weight: 105.2 kg 114.7 kg 108.3 kg    Physical Exam   General appearance: intubated, sedated on vent Neck: JVD - 3 cm above sternal notch, no carotid bruit and thyroid not enlarged, symmetric, no tenderness/mass/nodules Lungs: diminished breath sounds bibasilar Heart: regular rate and rhythm Abdomen: soft, non-tender; bowel sounds normal; no masses,  no organomegaly Extremities: edema trace pedal edema Pulses: 2+ and symmetric Skin: pale, cool, dry Neurologic: Mental status: intubated, sedated on vent Psych: Agitated overnight, cannot assess now  Inpatient Medications    Scheduled Meds: . aspirin  81 mg Per  Tube Daily  . chlorhexidine gluconate (MEDLINE KIT)  15 mL Mouth Rinse BID  . Chlorhexidine Gluconate Cloth  6 each Topical Daily  . feeding supplement (PRO-STAT SUGAR FREE 64)  30 mL Per Tube BID  . feeding supplement (VITAL HIGH PROTEIN)  1,000 mL Per Tube Q24H  . furosemide      . furosemide  40 mg Intravenous Once  . heparin  5,000 Units Subcutaneous Q8H  . insulin aspart  0-9 Units Subcutaneous Q4H  . mouth rinse  15 mL Mouth Rinse 10 times per day  . mouth rinse  15 mL Mouth Rinse BID  . pantoprazole sodium  40 mg Oral Daily  . sodium chloride flush  10-40 mL Intracatheter Q12H  . sodium chloride flush  3 mL Intravenous Q12H  . ticagrelor  90 mg Oral BID    Continuous Infusions: . sodium chloride Stopped (07/11/19 0649)  . sodium chloride 20 mL/hr at 07/13/19 0600  . amiodarone 30 mg/hr (07/14/19 0254)  . ceFEPime (MAXIPIME) IV    . dexmedetomidine (PRECEDEX) IV infusion 0.3 mcg/kg/hr (07/14/19 0256)  . levETIRAcetam Stopped (07/13/19 1632)  . milrinone 0.125 mcg/kg/min (07/14/19 0000)  . norepinephrine (LEVOPHED) Adult infusion Stopped (07/13/19 1140)  . propofol (DIPRIVAN) infusion 30 mcg/kg/min (07/14/19 0724)  . sodium chloride    . vancomycin    . vancomycin      PRN Meds: sodium chloride, acetaminophen (TYLENOL) oral liquid 160  mg/5 mL, acetaminophen, ondansetron (ZOFRAN) IV, sodium chloride flush, sodium chloride flush   Labs   Results for orders placed or performed during the hospital encounter of 07/11/19 (from the past 48 hour(s))  Glucose, capillary     Status: Abnormal   Collection Time: 07/12/19  7:58 AM  Result Value Ref Range   Glucose-Capillary 146 (H) 70 - 99 mg/dL    Comment: Glucose reference range applies only to samples taken after fasting for at least 8 hours.  Glucose, capillary     Status: Abnormal   Collection Time: 07/12/19 11:12 AM  Result Value Ref Range   Glucose-Capillary 124 (H) 70 - 99 mg/dL    Comment: Glucose reference range  applies only to samples taken after fasting for at least 8 hours.  Glucose, capillary     Status: Abnormal   Collection Time: 07/12/19  3:30 PM  Result Value Ref Range   Glucose-Capillary 138 (H) 70 - 99 mg/dL    Comment: Glucose reference range applies only to samples taken after fasting for at least 8 hours.  Magnesium     Status: None   Collection Time: 07/12/19  4:17 PM  Result Value Ref Range   Magnesium 2.2 1.7 - 2.4 mg/dL    Comment: Performed at [facility] [address], [place], [place] [address]  Phosphorus     Status: None   Collection Time: 07/12/19  4:17 PM  Result Value Ref Range   Phosphorus 3.5 2.5 - 4.6 mg/dL    Comment: Performed at [facility] [address], [place], [place] [address]  .Cooxemetry Panel (carboxy, met, total hgb, O2 sat)     Status: Abnormal   Collection Time: 07/12/19  4:17 PM  Result Value Ref Range   Total hemoglobin 11.4 (L) 12.0 - 16.0 g/dL   O2 Saturation 73.2 %   Carboxyhemoglobin 1.1 0.5 - 1.5 %   Methemoglobin 1.5 0.0 - 1.5 %    Comment: Performed at [facility] [address] [place], [place] [address]  Glucose, capillary     Status: Abnormal   Collection Time: 07/12/19  7:28 PM  Result Value Ref Range   Glucose-Capillary 120 (H) 70 - 99 mg/dL    Comment: Glucose reference range applies only to samples taken after fasting for at least 8 hours.  Cooxemetry Panel (carboxy, met, total hgb, O2 sat)     Status: Abnormal   Collection Time: 07/12/19 10:15 PM  Result Value Ref Range   Total hemoglobin 12.2 12.0 - 16.0 g/dL   O2 Saturation 91.9 %   Carboxyhemoglobin 1.1 0.5 - 1.5 %   Methemoglobin 1.6 (H) 0.0 - 1.5 %    Comment: Performed at [facility] [address], [place], [place] [address]  Glucose, capillary     Status: Abnormal   Collection Time: 07/12/19 11:25 PM  Result Value Ref Range   Glucose-Capillary 124 (H) 70 - 99 mg/dL    Comment: Glucose reference range applies only to samples taken  after fasting for at least 8 hours.  Glucose, capillary     Status: Abnormal   Collection Time: 07/13/19  3:37 AM  Result Value Ref Range   Glucose-Capillary 129 (H) 70 - 99 mg/dL    Comment: Glucose reference range applies only to samples taken after fasting for at least 8 hours.  Triglycerides     Status: Abnormal   Collection Time: 07/13/19  4:58 AM  Result Value Ref Range  Triglycerides 845 (H) <150 mg/dL    Comment: Performed at [facility] [address], [place], [place] [address]  Basic metabolic panel     Status: Abnormal   Collection Time: 07/13/19  4:58 AM  Result Value Ref Range   Sodium 136 135 - 145 mmol/L   Potassium 3.6 3.5 - 5.1 mmol/L   Chloride 106 98 - 111 mmol/L   CO2 19 (L) 22 - 32 mmol/L   Glucose, Bld 141 (H) 70 - 99 mg/dL    Comment: Glucose reference range applies only to samples taken after fasting for at least 8 hours.   BUN 31 (H) 6 - 20 mg/dL   Creatinine, Ser 1.42 (H) 0.61 - 1.24 mg/dL   Calcium 7.5 (L) 8.9 - 10.3 mg/dL   GFR calc non Af Amer 59 (L) >60 mL/min   GFR calc Af Amer >60 >60 mL/min   Anion gap 11 5 - 15    Comment: Performed at [facility] [address] [place], [place] [address]  Cooxemetry Panel (carboxy, met, total hgb, O2 sat)     Status: Abnormal   Collection Time: 07/13/19  4:58 AM  Result Value Ref Range   Total hemoglobin 11.6 (L) 12.0 - 16.0 g/dL   O2 Saturation 71.0 %   Carboxyhemoglobin 1.2 0.5 - 1.5 %   Methemoglobin 1.6 (H) 0.0 - 1.5 %    Comment: Performed at [facility] [address], [place], [place] [address]  Glucose, capillary     Status: Abnormal   Collection Time: 07/13/19  7:38 AM  Result Value Ref Range   Glucose-Capillary 127 (H) 70 - 99 mg/dL    Comment: Glucose reference range applies only to samples taken after fasting for at least 8 hours.  Glucose, capillary     Status: Abnormal   Collection Time: 07/13/19 11:23 AM  Result Value Ref Range   Glucose-Capillary 131 (H) 70 - 99  mg/dL    Comment: Glucose reference range applies only to samples taken after fasting for at least 8 hours.  Glucose, capillary     Status: Abnormal   Collection Time: 07/13/19  4:04 PM  Result Value Ref Range   Glucose-Capillary 140 (H) 70 - 99 mg/dL    Comment: Glucose reference range applies only to samples taken after fasting for at least 8 hours.  I-STAT 7, (LYTES, BLD GAS, ICA, H+H)     Status: Abnormal   Collection Time: 07/13/19  4:16 PM  Result Value Ref Range   pH, Arterial 7.319 (L) 7.350 - 7.450   pCO2 arterial 40.9 32.0 - 48.0 mmHg   pO2, Arterial 67.0 (L) 83.0 - 108.0 mmHg   Bicarbonate 21.2 20.0 - 28.0 mmol/L   TCO2 22 22 - 32 mmol/L   O2 Saturation 92.0 %   Acid-base deficit 5.0 (H) 0.0 - 2.0 mmol/L   Sodium 137 135 - 145 mmol/L   Potassium 3.7 3.5 - 5.1 mmol/L   Calcium, Ion 1.12 (L) 1.15 - 1.40 mmol/L   HCT 29.0 (L) 39.0 - 52.0 %   Hemoglobin 9.9 (L) 13.0 - 17.0 g/dL   Patient temperature 36.2 C    Collection site BRACHIAL ARTERY    Drawn by RT    Sample type ARTERIAL   Glucose, capillary     Status: Abnormal   Collection Time: 07/13/19  8:23 PM  Result Value Ref Range   Glucose-Capillary 181 (H) 70 - 99 mg/dL    Comment:  Glucose reference range applies only to samples taken after fasting for at least 8 hours.  Glucose, capillary     Status: Abnormal   Collection Time: 07/13/19 11:28 PM  Result Value Ref Range   Glucose-Capillary 150 (H) 70 - 99 mg/dL    Comment: Glucose reference range applies only to samples taken after fasting for at least 8 hours.  I-STAT 7, (LYTES, BLD GAS, ICA, H+H)     Status: Abnormal   Collection Time: 07/14/19  2:34 AM  Result Value Ref Range   pH, Arterial 7.392 7.350 - 7.450   pCO2 arterial 35.8 32.0 - 48.0 mmHg   pO2, Arterial 125.0 (H) 83.0 - 108.0 mmHg   Bicarbonate 21.9 20.0 - 28.0 mmol/L   TCO2 23 22 - 32 mmol/L   O2 Saturation 99.0 %   Acid-base deficit 3.0 (H) 0.0 - 2.0 mmol/L   Sodium 138 135 - 145 mmol/L    Potassium 3.7 3.5 - 5.1 mmol/L   Calcium, Ion 1.11 (L) 1.15 - 1.40 mmol/L   HCT 27.0 (L) 39.0 - 52.0 %   Hemoglobin 9.2 (L) 13.0 - 17.0 g/dL   Patient temperature 36.4 C    Collection site RADIAL, ALLEN'S TEST ACCEPTABLE    Drawn by RT    Sample type ARTERIAL   Basic metabolic panel     Status: Abnormal   Collection Time: 07/14/19  4:32 AM  Result Value Ref Range   Sodium 137 135 - 145 mmol/L   Potassium 3.7 3.5 - 5.1 mmol/L   Chloride 106 98 - 111 mmol/L   CO2 21 (L) 22 - 32 mmol/L   Glucose, Bld 167 (H) 70 - 99 mg/dL    Comment: Glucose reference range applies only to samples taken after fasting for at least 8 hours.   BUN 36 (H) 6 - 20 mg/dL   Creatinine, Ser 1.14 0.61 - 1.24 mg/dL   Calcium 7.4 (L) 8.9 - 10.3 mg/dL   GFR calc non Af Amer >60 >60 mL/min   GFR calc Af Amer >60 >60 mL/min   Anion gap 10 5 - 15    Comment: Performed at [facility] [address], [place], [place] [address]  Cooxemetry Panel (carboxy, met, total hgb, O2 sat)     Status: Abnormal   Collection Time: 07/14/19  4:32 AM  Result Value Ref Range   Total hemoglobin 10.7 (L) 12.0 - 16.0 g/dL   O2 Saturation 63.5 %   Carboxyhemoglobin 1.0 0.5 - 1.5 %   Methemoglobin 1.4 0.0 - 1.5 %    Comment: Performed at [facility], [place] [address], [place], [place] [address]  Glucose, capillary     Status: Abnormal   Collection Time: 07/14/19  4:36 AM  Result Value Ref Range   Glucose-Capillary 152 (H) 70 - 99 mg/dL    Comment: Glucose reference range applies only to samples taken after fasting for at least 8 hours.    ECG   Sinus rhythm at 80, acute anterior ST elevation ~73m with Q waves - Personally Reviewed  Telemetry   Sinus rhythm, no significant arrythmias - Personally Reviewed  Radiology    CT HEAD WO CONTRAST  Result Date: 07/14/2019 CLINICAL DATA:  Initial evaluation for acute encephalopathy, somnolence. EXAM: CT HEAD WITHOUT CONTRAST TECHNIQUE: Contiguous axial images were obtained  from the base of the skull through the vertex without intravenous contrast. COMPARISON:  Prior CT from 07/11/2019. FINDINGS: Brain: Urine volume within normal limits. Chronic appearing  right cerebellar infarct again noted. There is a new wedge-shaped focal hypodensity involving the parasagittal right occipital lobe (series 4, image 19), suspicious for an evolving acute ischemic infarct, right PCA territory (series 4, image 19). Additionally, subtle hypodensity seen at the subcortical white matter of the anterior right frontal lobe, age indeterminate, but could also reflect sequelae of evolving ischemia (series 4, image 24). Gray-white matter differentiation otherwise maintained. No intracranial hemorrhage. No mass lesion or midline shift. No hydrocephalus or extra-axial fluid collection. Vascular: No hyperdense vessel. Scattered vascular calcifications noted within the carotid siphons. Skull: Scalp soft tissues within normal limits. Calvarium intact. Sinuses/Orbits: Globes and orbital soft tissues within normal limits. Moderate mucosal thickening in opacity within the sphenoid ethmoidal sinuses and maxillary sinuses. Trace right mastoid effusion. Other: None. IMPRESSION: 1. New wedge-shaped hypodensity involving the parasagittal right occipital lobe, suspicious for an evolving acute ischemic infarct, right PCA territory. Follow-up examination with dedicated MRI suggested for confirmation. 2. Additional subtle hypodensity at the subcortical white matter of the anterior right frontal lobe, age indeterminate, but could also reflect sequelae of evolving ischemia. 3. Chronic right cerebellar infarct. 4. Moderate paranasal sinus disease. Electronically Signed   By: Jeannine Boga M.D.   On: 07/14/2019 04:20   DG CHEST PORT 1 VIEW  Result Date: 07/14/2019 CLINICAL DATA:  Cardiac arrest EXAM: PORTABLE CHEST 1 VIEW COMPARISON:  Radiograph 07/12/2019 FINDINGS: Lung volumes are significantly diminished. There are  patchy multifocal opacities in the right and left perihilar regions. No pneumothorax or visible effusion. Cardiac prominence likely related to low volumes and portable technique, similar to comparison portable radiographs. No acute osseous or soft tissue abnormality. Removal of the endotracheal and transesophageal tubes as well as a lower extremity approach Swan-Ganz catheterization. IMPRESSION: Patchy multifocal opacities in the right and left perihilar regions, could reflect developing infection, edema and/or ARDS in the setting of resuscitative efforts. Electronically Signed   By: Lovena Le M.D.   On: 07/14/2019 02:45   Overnight EEG with video  Result Date: 07/12/2019 [name], MD     07/13/2019  8:32 AM Patient Name: Edward Mendoza MRN: [id number] Epilepsy Attending: [name] Referring Physician/Provider: Dr Glenetta Hew Duration: 07/11/2019 0924 to 07/12/2019 0924 Total Study duration: 24 hours  Patient history: 49yo M who presented with cardiac arrest on TTM. EEG to evaluate for seizure  Level of alertness: comatose  AEDs during EEG study: LEV, propofol  Technical aspects: This EEG study was done with scalp electrodes positioned according to the 10-20 International system of electrode placement. Electrical activity was acquired at a sampling rate of 500Hz  and reviewed with a high frequency filter of 70Hz  and a low frequency filter of 1Hz . EEG data were recorded continuously and digitally stored.  DESCRIPTION: EEG showed generalized 13-15Hz  sharply contoured beta activity admixed with polymorphic 6-7hz  delta activity. Brief periods of generalized eeg attenuation lasting 2-3 seconds was also noted. EEG was not reactive to tactile stimulation. Hyperventilation and photic stimulation were not performed. Event button was pressed on 07/11/2019 1543, 1615 and 1630 for arm twitching. Concomitant eeg before, during and after the event didn't show any change to suggest seizure.  ABNORMALITY -  Continuous slow, generalized - Excessive beta, generalized - Background attenuation, generalized  IMPRESSION: This study is suggestive of profound diffuse encephalopathy, non specific to etiology. No seizures or epileptiform discharges were seen throughout the recording.  Event button was pressed as describe above without eeg change and were likely not epileptic. Priyanka Barbra Sarks  Cardiac Studiesa   07/11/2019  Procedure: 2D Echo, Cardiac Doppler and Color Doppler   Indications:  Cardiac Arrhythmias / LV Function. Cardiac arrest    History:    Patient has no prior history of Echocardiogram  examinations.         Acute MI, Abnormal ECG, Arrythmias:Cardiac Arrest and VF;         Signs/Symptoms:Hypotension.    Sonographer:  Roseanna Rainbow RDCS  Referring Phys: [place]    1. Left ventricular ejection fraction, by estimation, is 15%%. The left  ventricle has severely decreased function. The left ventricle demonstrates  global hypokinesis. There is moderate left ventricular hypertrophy. Left  ventricular diastolic parameters  are indeterminate.  2. Right ventricular systolic function is mildly reduced. The right  ventricular size is normal. Tricuspid regurgitation signal is inadequate  for assessing PA pressure.  3. The mitral valve is grossly normal. Trivial mitral valve  regurgitation.  4. The aortic valve is tricuspid. Aortic valve regurgitation is not  visualized.  5. The inferior vena cava is dilated in size with <50% respiratory  variability, suggesting right atrial pressure of 15 mmHg.  Assessment   1. Active Problems: 2.   Cardiac arrest with ventricular fibrillation ([facility]) 3.   Acute ST elevation myocardial infarction (STEMI) involving left anterior descending coronary artery ([facility]) 4.   Arterial hypotension 5.   Endotracheally intubated 6.   Paroxysmal atrial fibrillation ([facility]) 7.   Plan   1. Acute anterior  STEMI - 99% proximal LAD occlusion, s/p PCI to the Proximal LAD complicated by cardiogenic shock. LVEF thought to be 35-45% at cath, then 15% by echo (personally reviewed). Troponin rose to >27000 - EKG shows ST elevation today with Q waves, suspect evolving infarct. Not able to determine if there are new ischemic symptoms. Check repeat troponin and limited echo. Repeat 12-lead EKG in am. Give Brilinta this am. Co-ox is reasonable on milrinone -good urine output with IV lasix. 2. Cardiogenic shock: ICM, LVEF 15% with proximal LAD occlusion - co-ox 63% today, on milrinione, good urine output, creatinine has improved to 1.14. Continue IV lasix. CXR today shows multifocal airspace opacities, ?asp pneumonia vs edema. Repeat limited echo for LV function. 3. Agitation/encephalopathy - EEG shows generalized slowing. Re-intubated overnight for airway protection. Plan for head MRI today.  4. PAF - suspect ischemic, maintaining sinus on IV amiodarone.  CRITICAL CARE TIME: I have spent a total of 35 minutes with patient reviewing hospital notes, telemetry, EKGs, labs and examining the patient as well as establishing an assessment and plan that was discussed with the patient. > 50% of time was spent in direct patient care. The patient is critically ill with multi-organ system failure and requires high complexity decision making for assessment and support, frequent evaluation and titration of therapies, application of advanced monitoring technologies and extensive interpretation of multiple databases.   Length of Stay:  LOS: 3 days   Pixie Casino, MD, [facility], [place] Director of the [facility] Lipid [facility] &  Cardiovascular Risk Reduction [facility] Diplomate of the American Board of Clinical Lipidology Attending Cardiologist  Direct Dial: [phone]  Fax: [phone]  Website:  [email or web address] 07/14/2019, 7:47 AM

## 2019-07-14 NOTE — Progress Notes (Signed)
SLP Cancellation Note  Patient Details Name: Edward Mendoza MRN: [id number] DOB: 1970/10/25   Cancelled treatment:       Reason Eval/Treat Not Completed: Patient not medically ready.  Pt was re-intubated this morning and is therefore not appropriate for a BSE at this time.  SLP will re-attempt efforts as pt is medically appropriate.    [name] Dulse Rutan 07/14/2019, 7:58 AM

## 2019-07-14 NOTE — Progress Notes (Signed)
Patient failed bedside swallow test at 0100 in an effort to give patient missed dose of Brilinta from previous evening. Cardiology consult regarding patient missing Brilinta dose due to no route for delivery, this RN was told to place an NG tube for medication delivery. Patient able to communicate and following commands at this time.  During NG placement Patient experienced increasing hypoxia and approximately 30 seconds of respiratory arrest. pulses lost on monitor momentarily, L carotid pulse present on palpation. Patient improved with bagging, patient quickly regained consciousness   CCM on camera during and post arrest.

## 2019-07-14 NOTE — Progress Notes (Signed)
eLink Physician-Brief Progress Note Patient Name: Edward Mendoza DOB: 27-Aug-1970 MRN: [id number]   Date of Service  07/14/2019  HPI/Events of Note  Hypoxia - Increased WOB. Brief respiratory arrest. Improved with bagging. Now on NRBM with sat = 91. Will try on BiPAP.   eICU Interventions  Will order: 1. Portable CXR STAT. 2. Triaal of BiPAP. 3. ABG on BiPAP X 30 minutes.  4. Will ask ground team to evaluate patient at bedside.      Intervention Category Major Interventions: Hypoxemia - evaluation and management  Sahmya Arai [place] 07/14/2019, 2:18 AM

## 2019-07-15 ENCOUNTER — Inpatient Hospital Stay (HOSPITAL_COMMUNITY): Payer: Managed Care, Other (non HMO)

## 2019-07-15 LAB — GLUCOSE, CAPILLARY
Glucose-Capillary: 109 mg/dL — ABNORMAL HIGH (ref 70–99)
Glucose-Capillary: 111 mg/dL — ABNORMAL HIGH (ref 70–99)
Glucose-Capillary: 118 mg/dL — ABNORMAL HIGH (ref 70–99)
Glucose-Capillary: 122 mg/dL — ABNORMAL HIGH (ref 70–99)
Glucose-Capillary: 124 mg/dL — ABNORMAL HIGH (ref 70–99)
Glucose-Capillary: 134 mg/dL — ABNORMAL HIGH (ref 70–99)
Glucose-Capillary: 64 mg/dL — ABNORMAL LOW (ref 70–99)

## 2019-07-15 LAB — CBC
HCT: 31.1 % — ABNORMAL LOW (ref 39.0–52.0)
Hemoglobin: 10.6 g/dL — ABNORMAL LOW (ref 13.0–17.0)
MCH: 31.9 pg (ref 26.0–34.0)
MCHC: 34.1 g/dL (ref 30.0–36.0)
MCV: 93.7 fL (ref 80.0–100.0)
Platelets: 120 10*3/uL — ABNORMAL LOW (ref 150–400)
RBC: 3.32 MIL/uL — ABNORMAL LOW (ref 4.22–5.81)
RDW: 13.2 % (ref 11.5–15.5)
WBC: 10.2 10*3/uL (ref 4.0–10.5)
nRBC: 0 % (ref 0.0–0.2)

## 2019-07-15 LAB — COMPREHENSIVE METABOLIC PANEL
ALT: 100 U/L — ABNORMAL HIGH (ref 0–44)
AST: 184 U/L — ABNORMAL HIGH (ref 15–41)
Albumin: 2.1 g/dL — ABNORMAL LOW (ref 3.5–5.0)
Alkaline Phosphatase: 84 U/L (ref 38–126)
Anion gap: 10 (ref 5–15)
BUN: 33 mg/dL — ABNORMAL HIGH (ref 6–20)
CO2: 23 mmol/L (ref 22–32)
Calcium: 7.5 mg/dL — ABNORMAL LOW (ref 8.9–10.3)
Chloride: 106 mmol/L (ref 98–111)
Creatinine, Ser: 1.29 mg/dL — ABNORMAL HIGH (ref 0.61–1.24)
GFR calc Af Amer: >60 mL/min (ref >60)
GFR calc non Af Amer: >60 mL/min (ref >60)
Glucose, Bld: 99 mg/dL (ref 70–99)
Potassium: 4.9 mmol/L (ref 3.5–5.1)
Sodium: 139 mmol/L (ref 135–145)
Total Bilirubin: 1.4 mg/dL — ABNORMAL HIGH (ref 0.3–1.2)
Total Protein: 5 g/dL — ABNORMAL LOW (ref 6.5–8.1)

## 2019-07-15 LAB — COOXEMETRY PANEL
Carboxyhemoglobin: 0.7 % (ref 0.5–1.5)
Methemoglobin: 0.9 % (ref 0.0–1.5)
O2 Saturation: 75.1 %
Total hemoglobin: 16.2 g/dL — ABNORMAL HIGH (ref 12.0–16.0)

## 2019-07-15 LAB — TRIGLYCERIDES: Triglycerides: 208 mg/dL — ABNORMAL HIGH (ref <150)

## 2019-07-15 LAB — MAGNESIUM
Magnesium: 2.5 mg/dL — ABNORMAL HIGH (ref 1.7–2.4)
Magnesium: 2.4 mg/dL (ref 1.7–2.4)

## 2019-07-15 LAB — PHOSPHORUS
Phosphorus: 1.5 mg/dL — ABNORMAL LOW (ref 2.5–4.6)
Phosphorus: 3.1 mg/dL (ref 2.5–4.6)

## 2019-07-15 MED ORDER — SODIUM CHLORIDE 0.9 % IV SOLN
2.0000 g | Freq: Three times a day (TID) | INTRAVENOUS | Status: AC
  Administered 2019-07-15 – 2019-07-20 (×18): 2 g via INTRAVENOUS
  Filled 2019-07-15 (×20): qty 2

## 2019-07-15 MED ORDER — SODIUM PHOSPHATES 45 MMOLE/15ML IV SOLN
20.0000 mmol | Freq: Once | INTRAVENOUS | Status: AC
  Administered 2019-07-15: 20 mmol via INTRAVENOUS
  Filled 2019-07-15: qty 6.67

## 2019-07-15 NOTE — Progress Notes (Signed)
DAILY PROGRESS NOTE   Patient Name: Edward Mendoza Date of Encounter: 07/15/2019 Cardiologist: No primary care provider on file.  Chief Complaint   Remains intubated  Patient Profile   Edward Mendoza is a 49 y.o. male with no prior medical history who presented with acute STEMI complicated by witnessed VF arrest.  Subjective   A-line placed by Dr. Haroldine Laws yesterday for more accurate hemodynamic monitoring. Additional diuresis of 3.7L negative. Co-ox today up to 75 from 64. Brain MRI ordered, but pending. CXR today looks less fluffy.  Objective   Vitals:   07/15/19 0400 07/15/19 0500 07/15/19 0600 07/15/19 0700  BP:      Pulse: 100 92 93 93  Resp: (!) 21 (!) 22 (!) 22 16  Temp: 99.5 F (37.5 C) 99.1 F (37.3 C) 99 F (37.2 C) 98.8 F (37.1 C)  TempSrc:      SpO2: 100% 100% 100% 100%  Weight:  104.6 kg    Height:        Intake/Output Summary (Last 24 hours) at 07/15/2019 0756 Last data filed at 07/15/2019 0600 Gross per 24 hour  Intake 2385.49 ml  Output 6150 ml  Net -3764.51 ml   Filed Weights   07/14/19 0500 07/14/19 0800 07/15/19 0500  Weight: 108.3 kg 108.2 kg 104.6 kg    Physical Exam   General appearance:   Neck: JVD - 3 cm above sternal notch, no carotid bruit and thyroid not enlarged, symmetric, no tenderness/mass/nodules Lungs: diminished breath sounds bibasilar Heart: regular rate and rhythm Abdomen: soft, non-tender; bowel sounds normal; no masses,  no organomegaly Extremities: edema 1+ pedal edema Pulses: 2+ and symmetric Skin: pale, cool, dry Neurologic: Mental status: intubated, sedated on vent, opens eyes to stimulus, tracks Psych: cannot assess  Inpatient Medications    Scheduled Meds: . aspirin  81 mg Per Tube Daily  . chlorhexidine gluconate (MEDLINE KIT)  15 mL Mouth Rinse BID  . Chlorhexidine Gluconate Cloth  6 each Topical Daily  . digoxin  0.125 mg Per Tube Daily  . furosemide  40 mg Intravenous Once  . furosemide  40 mg  Intravenous BID  . heparin  5,000 Units Subcutaneous Q8H  . insulin aspart  0-9 Units Subcutaneous Q4H  . mouth rinse  15 mL Mouth Rinse 10 times per day  . mouth rinse  15 mL Mouth Rinse BID  . pantoprazole sodium  40 mg Per Tube Daily  . potassium chloride  20 mEq Per Tube BID  . sodium chloride flush  10-40 mL Intracatheter Q12H  . sodium chloride flush  3 mL Intravenous Q12H  . spironolactone  12.5 mg Per Tube Daily  . ticagrelor  90 mg Per Tube BID    Continuous Infusions: . sodium chloride 10 mL/hr at 07/15/19 0450  . sodium chloride 20 mL/hr at 07/13/19 0600  . amiodarone 30 mg/hr (07/15/19 0600)  . ceFEPime (MAXIPIME) IV    . dexmedetomidine (PRECEDEX) IV infusion Stopped (07/14/19 1937)  . feeding supplement (VITAL HIGH PROTEIN) 1,000 mL (07/14/19 1100)  . levETIRAcetam Stopped (07/15/19 0425)  . milrinone 0.125 mcg/kg/min (07/15/19 0600)  . norepinephrine (LEVOPHED) Adult infusion Stopped (07/14/19 1814)  . propofol (DIPRIVAN) infusion 40 mcg/kg/min (07/15/19 0655)  . sodium chloride    . sodium phosphate  Dextrose 5% IVPB      PRN Meds: sodium chloride, acetaminophen (TYLENOL) oral liquid 160 mg/5 mL, acetaminophen, ondansetron (ZOFRAN) IV, sodium chloride flush, sodium chloride flush   Labs   Results for  orders placed or performed during the hospital encounter of 07/11/19 (from the past 48 hour(s))  Glucose, capillary     Status: Abnormal   Collection Time: 07/13/19  7:38 AM  Result Value Ref Range   Glucose-Capillary 127 (H) 70 - 99 mg/dL    Comment: Glucose reference range applies only to samples taken after fasting for at least 8 hours.  Glucose, capillary     Status: Abnormal   Collection Time: 07/13/19 11:23 AM  Result Value Ref Range   Glucose-Capillary 131 (H) 70 - 99 mg/dL    Comment: Glucose reference range applies only to samples taken after fasting for at least 8 hours.  Glucose, capillary     Status: Abnormal   Collection Time: 07/13/19  4:04 PM    Result Value Ref Range   Glucose-Capillary 140 (H) 70 - 99 mg/dL    Comment: Glucose reference range applies only to samples taken after fasting for at least 8 hours.  I-STAT 7, (LYTES, BLD GAS, ICA, H+H)     Status: Abnormal   Collection Time: 07/13/19  4:16 PM  Result Value Ref Range   pH, Arterial 7.319 (L) 7.350 - 7.450   pCO2 arterial 40.9 32.0 - 48.0 mmHg   pO2, Arterial 67.0 (L) 83.0 - 108.0 mmHg   Bicarbonate 21.2 20.0 - 28.0 mmol/L   TCO2 22 22 - 32 mmol/L   O2 Saturation 92.0 %   Acid-base deficit 5.0 (H) 0.0 - 2.0 mmol/L   Sodium 137 135 - 145 mmol/L   Potassium 3.7 3.5 - 5.1 mmol/L   Calcium, Ion 1.12 (L) 1.15 - 1.40 mmol/L   HCT 29.0 (L) 39.0 - 52.0 %   Hemoglobin 9.9 (L) 13.0 - 17.0 g/dL   Patient temperature 36.2 C    Collection site BRACHIAL ARTERY    Drawn by RT    Sample type ARTERIAL   Glucose, capillary     Status: Abnormal   Collection Time: 07/13/19  8:23 PM  Result Value Ref Range   Glucose-Capillary 181 (H) 70 - 99 mg/dL    Comment: Glucose reference range applies only to samples taken after fasting for at least 8 hours.  Glucose, capillary     Status: Abnormal   Collection Time: 07/13/19 11:28 PM  Result Value Ref Range   Glucose-Capillary 150 (H) 70 - 99 mg/dL    Comment: Glucose reference range applies only to samples taken after fasting for at least 8 hours.  I-STAT 7, (LYTES, BLD GAS, ICA, H+H)     Status: Abnormal   Collection Time: 07/14/19  2:34 AM  Result Value Ref Range   pH, Arterial 7.392 7.350 - 7.450   pCO2 arterial 35.8 32.0 - 48.0 mmHg   pO2, Arterial 125.0 (H) 83.0 - 108.0 mmHg   Bicarbonate 21.9 20.0 - 28.0 mmol/L   TCO2 23 22 - 32 mmol/L   O2 Saturation 99.0 %   Acid-base deficit 3.0 (H) 0.0 - 2.0 mmol/L   Sodium 138 135 - 145 mmol/L   Potassium 3.7 3.5 - 5.1 mmol/L   Calcium, Ion 1.11 (L) 1.15 - 1.40 mmol/L   HCT 27.0 (L) 39.0 - 52.0 %   Hemoglobin 9.2 (L) 13.0 - 17.0 g/dL   Patient temperature 36.4 C    Collection site  RADIAL, ALLEN'S TEST ACCEPTABLE    Drawn by RT    Sample type ARTERIAL   Basic metabolic panel     Status: Abnormal   Collection Time: 07/14/19  4:32 AM  Result Value Ref Range   Sodium 137 135 - 145 mmol/L   Potassium 3.7 3.5 - 5.1 mmol/L   Chloride 106 98 - 111 mmol/L   CO2 21 (L) 22 - 32 mmol/L   Glucose, Bld 167 (H) 70 - 99 mg/dL    Comment: Glucose reference range applies only to samples taken after fasting for at least 8 hours.   BUN 36 (H) 6 - 20 mg/dL   Creatinine, Ser 1.14 0.61 - 1.24 mg/dL   Calcium 7.4 (L) 8.9 - 10.3 mg/dL   GFR calc non Af Amer >60 >60 mL/min   GFR calc Af Amer >60 >60 mL/min   Anion gap 10 5 - 15    Comment: Performed at [facility] [address], [place], [place] [address]  Cooxemetry Panel (carboxy, met, total hgb, O2 sat)     Status: Abnormal   Collection Time: 07/14/19  4:32 AM  Result Value Ref Range   Total hemoglobin 10.7 (L) 12.0 - 16.0 g/dL   O2 Saturation 63.5 %   Carboxyhemoglobin 1.0 0.5 - 1.5 %   Methemoglobin 1.4 0.0 - 1.5 %    Comment: Performed at [facility], [place] [address], [place], [place] [address]  Glucose, capillary     Status: Abnormal   Collection Time: 07/14/19  4:36 AM  Result Value Ref Range   Glucose-Capillary 152 (H) 70 - 99 mg/dL    Comment: Glucose reference range applies only to samples taken after fasting for at least 8 hours.  I-STAT 7, (LYTES, BLD GAS, ICA, H+H)     Status: Abnormal   Collection Time: 07/14/19  8:53 AM  Result Value Ref Range   pH, Arterial 7.326 (L) 7.350 - 7.450   pCO2 arterial 49.1 (H) 32.0 - 48.0 mmHg   pO2, Arterial 90.0 83.0 - 108.0 mmHg   Bicarbonate 25.8 20.0 - 28.0 mmol/L   TCO2 27 22 - 32 mmol/L   O2 Saturation 96.0 %   Acid-base deficit 1.0 0.0 - 2.0 mmol/L   Sodium 139 135 - 145 mmol/L   Potassium 3.5 3.5 - 5.1 mmol/L   Calcium, Ion 1.13 (L) 1.15 - 1.40 mmol/L   HCT 31.0 (L) 39.0 - 52.0 %   Hemoglobin 10.5 (L) 13.0 - 17.0 g/dL   Patient temperature 36.7 C     Collection site RADIAL, ALLEN'S TEST ACCEPTABLE    Drawn by Operator    Sample type ARTERIAL   Magnesium     Status: None   Collection Time: 07/14/19  8:54 AM  Result Value Ref Range   Magnesium 2.4 1.7 - 2.4 mg/dL    Comment: Performed at [facility], [place] [address], [place], [place] [address]  Phosphorus     Status: None   Collection Time: 07/14/19  8:54 AM  Result Value Ref Range   Phosphorus 3.8 2.5 - 4.6 mg/dL    Comment: Performed at [facility] [address] [place], [place] [address]  Procalcitonin - Baseline     Status: None   Collection Time: 07/14/19  8:54 AM  Result Value Ref Range   Procalcitonin 2.38 ng/mL    Comment:        Interpretation: PCT > 2 ng/mL: Systemic infection (sepsis) is likely, unless other causes are known. (NOTE)       Sepsis PCT Algorithm           Lower Respiratory Tract  Infection PCT Algorithm    ----------------------------     ----------------------------         PCT < 0.25 ng/mL                PCT < 0.10 ng/mL         Strongly encourage             Strongly discourage   discontinuation of antibiotics    initiation of antibiotics    ----------------------------     -----------------------------       PCT 0.25 - 0.50 ng/mL            PCT 0.10 - 0.25 ng/mL               OR       >80% decrease in PCT            Discourage initiation of                                            antibiotics      Encourage discontinuation           of antibiotics    ----------------------------     -----------------------------         PCT >= 0.50 ng/mL              PCT 0.26 - 0.50 ng/mL               AND       <80% decrease in PCT              Encourage initiation of                                             antibiotics       Encourage continuation           of antibiotics    ----------------------------     -----------------------------        PCT >= 0.50 ng/mL                  PCT > 0.50 ng/mL                AND         increase in PCT                  Strongly encourage                                      initiation of antibiotics    Strongly encourage escalation           of antibiotics                                     -----------------------------                                           PCT <= 0.25 ng/mL  OR                                        > 80% decrease in PCT                                     Discontinue / Do not initiate                                             antibiotics Performed at [facility], [place] [address], [place], [place] [address]   CBC     Status: Abnormal   Collection Time: 07/14/19  8:54 AM  Result Value Ref Range   WBC 13.8 (H) 4.0 - 10.5 K/uL   RBC 3.60 (L) 4.22 - 5.81 MIL/uL   Hemoglobin 11.3 (L) 13.0 - 17.0 g/dL   HCT 34.4 (L) 39.0 - 52.0 %   MCV 95.6 80.0 - 100.0 fL   MCH 31.4 26.0 - 34.0 pg   MCHC 32.8 30.0 - 36.0 g/dL   RDW 12.8 11.5 - 15.5 %   Platelets 102 (L) 150 - 400 K/uL    Comment: REPEATED TO VERIFY PLATELET COUNT CONFIRMED BY SMEAR Immature Platelet Fraction may be clinically indicated, consider ordering this additional test [id number]    nRBC 0.0 0.0 - 0.2 %    Comment: Performed at [facility], [place] [address], [place], [place] [address]  Troponin I (High Sensitivity)     Status: Abnormal   Collection Time: 07/14/19  8:54 AM  Result Value Ref Range   Troponin I (High Sensitivity) >27,000 (HH) <18 ng/L    Comment: RESULT CONFIRMED BY AUTOMATED DILUTION CRITICAL VALUE NOTED.  VALUE IS CONSISTENT WITH PREVIOUSLY REPORTED AND CALLED VALUE. Performed at [facility], [place] [address], [place], [place] [address]   Triglycerides     Status: Abnormal   Collection Time: 07/14/19  8:54 AM  Result Value Ref Range   Triglycerides 297 (H) <150 mg/dL    Comment: Performed at [facility] [address] [place], [place] [address]  Glucose, capillary      Status: Abnormal   Collection Time: 07/14/19  9:46 AM  Result Value Ref Range   Glucose-Capillary 117 (H) 70 - 99 mg/dL    Comment: Glucose reference range applies only to samples taken after fasting for at least 8 hours.  Troponin I (High Sensitivity)     Status: Abnormal   Collection Time: 07/14/19 10:41 AM  Result Value Ref Range   Troponin I (High Sensitivity) >27,000 (HH) <18 ng/L    Comment: RESULT CONFIRMED BY AUTOMATED DILUTION CRITICAL VALUE NOTED.  VALUE IS CONSISTENT WITH PREVIOUSLY REPORTED AND CALLED VALUE. Performed at [facility], [place] [address] [place], [place] [address]   I-STAT 7, (LYTES, BLD GAS, ICA, H+H)     Status: Abnormal   Collection Time: 07/14/19 10:45 AM  Result Value Ref Range   pH, Arterial 7.393 7.350 - 7.450   pCO2 arterial 41.1 32.0 - 48.0 mmHg   pO2, Arterial 190.0 (H) 83.0 - 108.0 mmHg   Bicarbonate 25.1 20.0 - 28.0 mmol/L   TCO2 26 22 - 32 mmol/L  O2 Saturation 100.0 %   Sodium 138 135 - 145 mmol/L   Potassium 3.6 3.5 - 5.1 mmol/L   Calcium, Ion 1.11 (L) 1.15 - 1.40 mmol/L   HCT 28.0 (L) 39.0 - 52.0 %   Hemoglobin 9.5 (L) 13.0 - 17.0 g/dL   Patient temperature 36.8 C    Collection site ARTERIAL LINE    Drawn by Operator    Sample type ARTERIAL   Glucose, capillary     Status: None   Collection Time: 07/14/19 12:40 PM  Result Value Ref Range   Glucose-Capillary 99 70 - 99 mg/dL    Comment: Glucose reference range applies only to samples taken after fasting for at least 8 hours.  Glucose, capillary     Status: Abnormal   Collection Time: 07/14/19  4:19 PM  Result Value Ref Range   Glucose-Capillary 106 (H) 70 - 99 mg/dL    Comment: Glucose reference range applies only to samples taken after fasting for at least 8 hours.  Magnesium     Status: None   Collection Time: 07/14/19  4:32 PM  Result Value Ref Range   Magnesium 2.3 1.7 - 2.4 mg/dL    Comment: Performed at [facility], [place] [address], [place], [place] [address]    Phosphorus     Status: Abnormal   Collection Time: 07/14/19  4:32 PM  Result Value Ref Range   Phosphorus 1.5 (L) 2.5 - 4.6 mg/dL    Comment: Performed at [facility] [address], [place], [place] [address]  Glucose, capillary     Status: Abnormal   Collection Time: 07/14/19  8:16 PM  Result Value Ref Range   Glucose-Capillary 130 (H) 70 - 99 mg/dL    Comment: Glucose reference range applies only to samples taken after fasting for at least 8 hours.  Glucose, capillary     Status: Abnormal   Collection Time: 07/15/19 12:12 AM  Result Value Ref Range   Glucose-Capillary 124 (H) 70 - 99 mg/dL    Comment: Glucose reference range applies only to samples taken after fasting for at least 8 hours.   Comment 1 Document in Chart   Magnesium     Status: Abnormal   Collection Time: 07/15/19  3:39 AM  Result Value Ref Range   Magnesium 2.5 (H) 1.7 - 2.4 mg/dL    Comment: Performed at [facility] [address] [place], [place] [address]  Phosphorus     Status: Abnormal   Collection Time: 07/15/19  3:39 AM  Result Value Ref Range   Phosphorus 1.5 (L) 2.5 - 4.6 mg/dL    Comment: Performed at [facility] [address] [place], [place] [address]  Comprehensive metabolic panel     Status: Abnormal   Collection Time: 07/15/19  3:39 AM  Result Value Ref Range   Sodium 139 135 - 145 mmol/L   Potassium 4.9 3.5 - 5.1 mmol/L   Chloride 106 98 - 111 mmol/L   CO2 23 22 - 32 mmol/L   Glucose, Bld 99 70 - 99 mg/dL    Comment: Glucose reference range applies only to samples taken after fasting for at least 8 hours.   BUN 33 (H) 6 - 20 mg/dL   Creatinine, Ser 1.29 (H) 0.61 - 1.24 mg/dL   Calcium 7.5 (L) 8.9 - 10.3 mg/dL   Total Protein 5.0 (L) 6.5 - 8.1 g/dL   Albumin 2.1 (L) 3.5 - 5.0 g/dL  AST 184 (H) 15 - 41 U/L   ALT 100 (H) 0 - 44 U/L   Alkaline Phosphatase 84 38 - 126 U/L   Total Bilirubin 1.4 (H) 0.3 - 1.2 mg/dL   GFR calc non Af Amer >60 >60 mL/min   GFR calc  Af Amer >60 >60 mL/min   Anion gap 10 5 - 15    Comment: Performed at [facility] [address], [place], [place] [address]  CBC     Status: Abnormal   Collection Time: 07/15/19  3:39 AM  Result Value Ref Range   WBC 10.2 4.0 - 10.5 K/uL   RBC 3.32 (L) 4.22 - 5.81 MIL/uL   Hemoglobin 10.6 (L) 13.0 - 17.0 g/dL   HCT 31.1 (L) 39.0 - 52.0 %   MCV 93.7 80.0 - 100.0 fL   MCH 31.9 26.0 - 34.0 pg   MCHC 34.1 30.0 - 36.0 g/dL   RDW 13.2 11.5 - 15.5 %   Platelets 120 (L) 150 - 400 K/uL   nRBC 0.0 0.0 - 0.2 %    Comment: Performed at [facility], [place] [address], [place], [place] [address]  Glucose, capillary     Status: Abnormal   Collection Time: 07/15/19  3:42 AM  Result Value Ref Range   Glucose-Capillary 64 (L) 70 - 99 mg/dL    Comment: Glucose reference range applies only to samples taken after fasting for at least 8 hours.   Comment 1 Document in Chart   Glucose, capillary     Status: Abnormal   Collection Time: 07/15/19  4:06 AM  Result Value Ref Range   Glucose-Capillary 111 (H) 70 - 99 mg/dL    Comment: Glucose reference range applies only to samples taken after fasting for at least 8 hours.   Comment 1 Document in Chart   Cooxemetry Panel (carboxy, met, total hgb, O2 sat)     Status: Abnormal   Collection Time: 07/15/19  4:35 AM  Result Value Ref Range   Total hemoglobin 16.2 (H) 12.0 - 16.0 g/dL   O2 Saturation 75.1 %   Carboxyhemoglobin 0.7 0.5 - 1.5 %   Methemoglobin 0.9 0.0 - 1.5 %    Comment: Performed at [facility], [place] [address], [place], [place] [address]    ECG   Sinus rhythm at 80, acute anterior ST elevation ~52m with Q waves - Personally Reviewed  Telemetry   Sinus rhythm, no significant arrythmias - Personally Reviewed  Radiology    DG Chest 1 View  Result Date: 07/14/2019 CLINICAL DATA:  ETT EXAM: CHEST  1 VIEW COMPARISON:  07/14/2019 at 0224 hours FINDINGS: Endotracheal tube terminates 5.5 cm above the carina. Enteric tube  terminates in the proximal gastric body. Multifocal patchy airspace opacities in the lungs bilaterally, upper lung predominant. No pleural effusion or pneumothorax. The heart is normal in size. Inferior approach Swan-Ganz catheter terminates in the left main pulmonary artery. IMPRESSION: Endotracheal tube terminates 5.5 cm above the carina. Additional support apparatus as above. Multifocal patchy opacities, suggesting multifocal pneumonia, suspicious for infection/aspiration, grossly unchanged. Electronically Signed   By: SJulian HyM.D.   On: 07/14/2019 08:01   CT HEAD WO CONTRAST  Result Date: 07/14/2019 CLINICAL DATA:  Initial evaluation for acute encephalopathy, somnolence. EXAM: CT HEAD WITHOUT CONTRAST TECHNIQUE: Contiguous axial images were obtained from the base of the skull through the vertex without intravenous contrast. COMPARISON:  Prior CT from 07/11/2019. FINDINGS: Brain: Urine volume within normal  limits. Chronic appearing right cerebellar infarct again noted. There is a new wedge-shaped focal hypodensity involving the parasagittal right occipital lobe (series 4, image 19), suspicious for an evolving acute ischemic infarct, right PCA territory (series 4, image 19). Additionally, subtle hypodensity seen at the subcortical white matter of the anterior right frontal lobe, age indeterminate, but could also reflect sequelae of evolving ischemia (series 4, image 24). Gray-white matter differentiation otherwise maintained. No intracranial hemorrhage. No mass lesion or midline shift. No hydrocephalus or extra-axial fluid collection. Vascular: No hyperdense vessel. Scattered vascular calcifications noted within the carotid siphons. Skull: Scalp soft tissues within normal limits. Calvarium intact. Sinuses/Orbits: Globes and orbital soft tissues within normal limits. Moderate mucosal thickening in opacity within the sphenoid ethmoidal sinuses and maxillary sinuses. Trace right mastoid effusion. Other:  None. IMPRESSION: 1. New wedge-shaped hypodensity involving the parasagittal right occipital lobe, suspicious for an evolving acute ischemic infarct, right PCA territory. Follow-up examination with dedicated MRI suggested for confirmation. 2. Additional subtle hypodensity at the subcortical white matter of the anterior right frontal lobe, age indeterminate, but could also reflect sequelae of evolving ischemia. 3. Chronic right cerebellar infarct. 4. Moderate paranasal sinus disease. Electronically Signed   By: Jeannine Boga M.D.   On: 07/14/2019 04:20   DG CHEST PORT 1 VIEW  Result Date: 07/14/2019 CLINICAL DATA:  Cardiac arrest EXAM: PORTABLE CHEST 1 VIEW COMPARISON:  Radiograph 07/12/2019 FINDINGS: Lung volumes are significantly diminished. There are patchy multifocal opacities in the right and left perihilar regions. No pneumothorax or visible effusion. Cardiac prominence likely related to low volumes and portable technique, similar to comparison portable radiographs. No acute osseous or soft tissue abnormality. Removal of the endotracheal and transesophageal tubes as well as a lower extremity approach Swan-Ganz catheterization. IMPRESSION: Patchy multifocal opacities in the right and left perihilar regions, could reflect developing infection, edema and/or ARDS in the setting of resuscitative efforts. Electronically Signed   By: Lovena Le M.D.   On: 07/14/2019 02:45   ECHOCARDIOGRAM LIMITED  Result Date: 07/14/2019    ECHOCARDIOGRAM LIMITED REPORT   Patient Name:   Edward Mendoza Date of Exam: 07/14/2019 Medical Rec #:  [id number]      Height:       74.0 in Accession #:    [id number]     Weight:       238.8 lb Date of Birth:  [birth year]      BSA:          2.343 m Patient Age:    69 years       BP:           111/85 mmHg Patient Gender: M              HR:           74 bpm. Exam Location:  Inpatient Procedure: Limited Echo, Limited Color Doppler and Cardiac Doppler Indications:    cardiac arrest   History:        Patient has prior history of Echocardiogram examinations, most                 recent 07/11/2019.  Sonographer:    Johny Chess Referring Phys: [address] C Yonis Carreon  Sonographer Comments: Echo performed with patient supine and on artificial respirator. IMPRESSIONS  1. Left ventricular ejection fraction, by estimation, is 20 to 25%. The left ventricle has severely decreased function. There is severe hypokinesis of the left ventricular, entire anteroseptal wall, anterior wall and apical segment. There is  moderate hypokinesis of the left ventricular, mid-apical inferior wall. Comparison(s): Prior images reviewed side by side. Changes from prior study are noted. 07/11/19: LVEF 15%. FINDINGS  Left Ventricle: Left ventricular ejection fraction, by estimation, is 20 to 25%. The left ventricle has severely decreased function. Severe hypokinesis of the left ventricular, entire anteroseptal wall, anterior wall and apical segment. Moderate hypokinesis of the left ventricular, mid-apical inferior wall. Pericardium: Trivial pericardial effusion is present. The pericardial effusion is posterior to the left ventricle. Venous: IVC assessment for right atrial pressure unable to be performed due to mechanical ventilation.  LEFT VENTRICLE PLAX 2D LVIDd:         5.30 cm      Diastology LVIDs:         3.90 cm      LV e' lateral:   8.70 cm/s LV PW:         1.00 cm      LV E/e' lateral: 5.7 LV IVS:        0.90 cm      LV e' medial:    6.96 cm/s LVOT diam:     2.30 cm      LV E/e' medial:  7.1 LV SV:         44 LV SV Index:   19 LVOT Area:     4.15 cm  LV Volumes (MOD) LV vol d, MOD A2C: 108.0 ml LV vol d, MOD A4C: 96.2 ml LV vol s, MOD A2C: 75.7 ml LV vol s, MOD A4C: 65.9 ml LV SV MOD A2C:     32.3 ml LV SV MOD A4C:     96.2 ml LV SV MOD BP:      34.4 ml LEFT ATRIUM         Index LA diam:    4.10 cm 1.75 cm/m  AORTIC VALVE LVOT Vmax:   62.50 cm/s LVOT Vmean:  44.500 cm/s LVOT VTI:    0.106 m  AORTA Ao Root diam: 3.20  cm MITRAL VALVE MV Area (PHT): 4.68 cm    SHUNTS MV Decel Time: 162 msec    Systemic VTI:  0.11 m MV E velocity: 49.50 cm/s  Systemic Diam: 2.30 cm MV A velocity: 61.10 cm/s MV E/A ratio:  0.81 Lyman Bishop MD Electronically signed by Lyman Bishop MD Signature Date/Time: 07/14/2019/12:02:06 PM    Final     Cardiac Studiesa   07/11/2019  Procedure: 2D Echo, Cardiac Doppler and Color Doppler   Indications:  Cardiac Arrhythmias / LV Function. Cardiac arrest    History:    Patient has no prior history of Echocardiogram  examinations.         Acute MI, Abnormal ECG, Arrythmias:Cardiac Arrest and VF;         Signs/Symptoms:Hypotension.    Sonographer:  Roseanna Rainbow RDCS  Referring Phys: [place]    1. Left ventricular ejection fraction, by estimation, is 15%%. The left  ventricle has severely decreased function. The left ventricle demonstrates  global hypokinesis. There is moderate left ventricular hypertrophy. Left  ventricular diastolic parameters  are indeterminate.  2. Right ventricular systolic function is mildly reduced. The right  ventricular size is normal. Tricuspid regurgitation signal is inadequate  for assessing PA pressure.  3. The mitral valve is grossly normal. Trivial mitral valve  regurgitation.  4. The aortic valve is tricuspid. Aortic valve regurgitation is not  visualized.  5. The inferior vena cava is dilated in size with <50% respiratory  variability, suggesting right atrial pressure of 15 mmHg.  Assessment   Active Problems:   Cardiac arrest with ventricular fibrillation (HCC)   Acute ST elevation myocardial infarction (STEMI) involving left anterior descending coronary artery (HCC)   Arterial hypotension   Endotracheally intubated   Paroxysmal atrial fibrillation ([place])   Plan   1. Acute anterior STEMI - 99% proximal LAD occlusion, s/p PCI to the Proximal LAD complicated by cardiogenic shock.  LVEF thought to be 35-45% at cath, then 15% by echo (personally reviewed). Troponin rose to >27000 - EKG shows ST elevation today with Q waves, suspect evolving infarct.  - troponins persistently elevated above upper limit of range -  On ASA, Brilinta, statin held due to elevated liver enzymes (improving) 2. Cardiogenic shock: ICM, LVEF 15% with proximal LAD occlusion - co-ox improved today at 75%. Excellent diuresis. Remains on milrinone. MAP's in the 80's, CVP 15 this am. Continue diuresis. - Milrinone 0.125, digoxin 0.125, brief levophed yesterday (now weaned off), aldactone 12.5 - Advanced heart failure service following 3. Agitation/encephalopathy - EEG shows generalized slowing. He is spontaneously opening eyes, tracking, lightly sedated. ?SBT later today. 4. PAF - suspect ischemic, maintaining sinus on IV amiodarone. Noted to have some PAC's overnight. 5. ?Aspiration pneumonia/pneumonitis - on cefepime per PCCM, CXR improved somewhat today to my interpretation  CRITICAL CARE TIME: I have spent a total of 35 minutes with patient reviewing hospital notes, telemetry, EKGs, labs and examining the patient as well as establishing an assessment and plan that was discussed with the patient. > 50% of time was spent in direct patient care. The patient is critically ill with multi-organ system failure and requires high complexity decision making for assessment and support, frequent evaluation and titration of therapies, application of advanced monitoring technologies and extensive interpretation of multiple databases.   Length of Stay:  LOS: 4 days   Pixie Casino, MD, [facility], [place] Director of the Advanced Lipid Disorders &  Cardiovascular Risk Reduction Clinic Diplomate of the American Board of Clinical Lipidology Attending Cardiologist  Direct Dial: [phone]  Fax: [phone]  Website:  [email or web address] 07/15/2019, 7:56 AM

## 2019-07-15 NOTE — Progress Notes (Signed)
Sputum culture collected and sent to the lab by RN.

## 2019-07-15 NOTE — Progress Notes (Signed)
SLP Cancellation Note  Patient Details Name: Edward Mendoza MRN: [id number] DOB: February 18, 1971   Cancelled treatment:       Reason Eval/Treat Not Completed: Patient not medically ready; Pt remains intubated this morning.  SLP will f/u as appropriate.    [name] [name] 07/15/2019, 7:45 AM

## 2019-07-15 NOTE — Progress Notes (Signed)
NAME:  Edward Mendoza, MRN:  [id number], DOB:  1970-05-10, LOS: 4 ADMISSION DATE:  07/11/2019, CONSULTATION DATE:   REFERRING MD:  Dr. Scarlette Calico , CHIEF COMPLAINT:  Cardiac arrest  Brief History    This is a 49 year old male who presents sp cardaic arrest from V fib and V tach noted to have anterior STEMI. SP cath  History of present illness    This is a 49 yo male with no prior medical history who presents with intermittent chest pain since Saturday. He called EMS. They arrived. Shortly after EMS arrived to patient he went into witnessed VF arrest and resuscitation ensued immediately. He received 6 defibrillations and 2 mg of epi. He had a total of 45-50 minutes of compressions. ROSC was achieved shortly after patient arrived to the ED. After ROSC achieved EKG noted anterior STEMI. Patient transferred here for intervention. In our cath lab patient noted to proximal LAD occlusion. He received PCI  To LAD. Right heart cath was also performed that noted the following pressures. No fevers chills or signs of illness recently.   Coronary angiography demonstrated 99% proximal-LAD occlusion. He received PCI with 3.5 x 18 Onyx DES. Right heart cath demonstrated mildly elevated filling pressures (RA 12, PCW 23) and preserve cardiac output with MVO2 67%. Very active at baseline. Current PPD smoker currently.   Past Medical History  None per chart review unable to reach the wife to confirm  Significant Hospital Events   Cath performed with stent placement to LAD  Consults:  PCCM  Procedures:  Swan 3/10 Inutabtion 07/11/19 PCI 07/11/19 Self extubation 3/13 Reintubation 3/14  Significant Diagnostic Tests:   CT head  07/11/19> R cerebellar infarct which appears remote or subacute in nature.  CT Head 3/14  1. Left ventricular ejection fraction, by estimation, is 20 to 25%. The  left ventricle has severely decreased function. There is severe  hypokinesis of the left ventricular, entire anteroseptal  wall, anterior  wall and apical segment. There is moderate  hypokinesis of the left ventricular, mid-apical inferior wall.  Comparison(s): Prior images reviewed side by side. Changes from prior  study are noted. 07/11/19: LVEF 15%.   Micro Data:  Covid negative on 07/11/19 Sputum cx 3/13>> Pct 4 3/13  Antimicrobials:  None  Interim history/subjective:  No issues overnight. Awake following commands on propofol. Pressor requirements resolved  Objective   Blood pressure 111/67, pulse 93, temperature 99 F (37.2 C), resp. rate (!) 22, height 6\' 2"  (1.88 m), weight 108.2 kg, SpO2 100 %. PAP: (40-64)/(19-35) 41/19 CVP:  [6 mmHg-26 mmHg] 8 mmHg PCWP:  [20 mmHg] 20 mmHg CO:  [5.9 L/min-10.9 L/min] 9.7 L/min CI:  [2.5 L/min/m2-4.5 L/min/m2] 4.1 L/min/m2  Vent Mode: PRVC FiO2 (%):  [40 %-100 %] 40 % Set Rate:  [20 bmp] 20 bmp Vt Set:  [650 mL] 650 mL PEEP:  [8 cmH20] 8 cmH20 Plateau Pressure:  [17 cmH20-25 cmH20] 17 cmH20   Intake/Output Summary (Last 24 hours) at 07/15/2019 0709 Last data filed at 07/15/2019 0400 Gross per 24 hour  Intake 2112.62 ml  Output 6075 ml  Net -3962.38 ml   Filed Weights   07/13/19 2000 07/14/19 0500 07/14/19 0800  Weight: 108.3 kg 108.3 kg 108.2 kg    Examination: GEN: no acute distress on vent HEENT: ETT with thick yellow secretions CV: Irregular, ext lukewarm PULM: less rhonci today, good TV on vent GI: Soft, +BS EXT: Trace edema NEURO: Following commands x 4 PSYCH: RASS 0 SKIN:  No rashes  BUN/Cr stable 4L neg yesterday Phos a little low: repleted LFTs improved Plts improved, H/H stable WBC improved  Resolved Hospital Problem list   NA  Assessment & Plan:   VT/ VF cardiac arrest 2/2 anterior STEMI s/p PCI w/ pLAD DES  -downtime > 40+ minutes Atrial fibrillation, intermittent  Elevated APTT Cardiogenic shock, estimated EF 10% P:  Inotropes per CHF team Continue aspirin Brilinta Pull off fluid as able  Acute encephalopathy  post cardiac arrest, strokes on CT head Doing well with propofol Watch TG Will be biggest issue working toward vent liberation   Acute respiratory failure in setting of cardiac arrest, new multifocal infiltrates suspicious for aspiration vs. HCAP P:  Okay for SIMV Continue cefepime VAP prevention protocol   AKI, improving  -liekly in setting of hypoperfusion  Mixed respiratory / metabolic NGMA P:  Follow I's and O's Follow urine output Follow BMP Diuresis per cardiology  Shock liver- improving  Sugars a bit low- keep an eye on P:  CBGs with SSI  Inadequate PO intake -intubated P Tube feeds  Best practice:  Diet: EN per RDN  Pain/Anxiety/Delirium protocol (if indicated): propofol/ fentanyl, RASS goal -1 to -2 VAP protocol (if indicated): yes DVT prophylaxis: heparin SQ GI prophylaxis: PPI Glucose control: SSI sensitive  Mobility: BR Code Status: Full  Family Communication:  wife updated 3/13, will update her when she comes in Disposition:  2H ICU    This patient is critically ill with multiple organ system failure; which, requires frequent high complexity decision making, assessment, support, evaluation, and titration of therapies. This was completed through the application of advanced monitoring technologies and extensive interpretation of multiple databases. During this encounter critical care time was devoted to patient care services described in this note for 32 minutes.  Candee Furbish, MD [facility] 07/15/2019 7:09 AM

## 2019-07-15 NOTE — Progress Notes (Signed)
Advanced Heart Failure Rounding Note  PCP-Cardiologist: No primary care provider on file.   Subjective:    Events:  3/10 admitted with anterior STEMI and VF arrest with prolonged resuscitation s/p PCI LAD 3/12 self extubated. Developed recurrent respiratory failure and reintubated  Remains on vent sedated on propofol but will follow commands. On milrinone 0.125. Off NE. Diuresing well on lasix 40 IV bid. Weight down 8 pounds  Rhythm stable on IV amio.   Tmax 101.1 on cefipime. Cx negative. Improving bilateral infiltrates. (Personally reviewed). Cultures remain negative.   Swan #s CVP 10 PA 49/25 (33) Thermo 6.9/2.9 Co-ox 75%   Objective:   Weight Range: 104.6 kg Body mass index is 29.61 kg/m.   Vital Signs:   Temp:  [98.4 F (36.9 C)-101.1 F (38.4 C)] 98.6 F (37 C) (03/14 0930) Pulse Rate:  [87-113] 95 (03/14 1205) Resp:  [13-26] 22 (03/14 1205) BP: (101-152)/(59-82) 122/59 (03/14 1205) SpO2:  [96 %-100 %] 100 % (03/14 1205) FiO2 (%):  [40 %-80 %] 40 % (03/14 1205) Weight:  [104.6 kg] 104.6 kg (03/14 0500) Last BM Date: 07/15/19  Weight change: Filed Weights   07/14/19 0500 07/14/19 0800 07/15/19 0500  Weight: 108.3 kg 108.2 kg 104.6 kg    Intake/Output:   Intake/Output Summary (Last 24 hours) at 07/15/2019 1542 Last data filed at 07/15/2019 0900 Gross per 24 hour  Intake 1967.12 ml  Output 5600 ml  Net -3632.88 ml      Physical Exam    General: Intubated sedated will arouse and follow commands HEENT: normal + ETT Neck: supple. JVP to jaw Carotids 2+ bilat; no bruits. No lymphadenopathy or thryomegaly appreciated. Cor: PMI nondisplaced. Regular rate & rhythm. No rubs, gallops or murmurs. Lungs: coarse Abdomen: soft, nontender, nondistended. No hepatosplenomegaly. No bruits or masses. Good bowel sounds. Extremities: no cyanosis, clubbing, rash, 3+ edema Neuro: sedated on vent  will arouse and follow commands   Telemetry   Sinus rhythm  90s.  Personally reviewed   Labs    CBC Recent Labs    07/14/19 0854 07/14/19 0854 07/14/19 1045 07/15/19 0339  WBC 13.8*  --   --  10.2  HGB 11.3*   < > 9.5* 10.6*  HCT 34.4*   < > 28.0* 31.1*  MCV 95.6  --   --  93.7  PLT 102*  --   --  120*   < > = values in this interval not displayed.   Basic Metabolic Panel Recent Labs    07/14/19 0432 07/14/19 0853 07/14/19 0854 07/14/19 1045 07/14/19 1632 07/15/19 0339  NA 137   < >  --  138  --  139  K 3.7   < >  --  3.6  --  4.9  CL 106  --   --   --   --  106  CO2 21*  --   --   --   --  23  GLUCOSE 167*  --   --   --   --  99  BUN 36*  --   --   --   --  33*  CREATININE 1.14  --   --   --   --  1.29*  CALCIUM 7.4*  --   --   --   --  7.5*  MG  --    < >   < >  --  2.3 2.5*  PHOS  --    < >   < >  --  1.5* 1.5*   < > = values in this interval not displayed.   Liver Function Tests Recent Labs    07/15/19 0339  AST 184*  ALT 100*  ALKPHOS 84  BILITOT 1.4*  PROT 5.0*  ALBUMIN 2.1*   No results for input(s): LIPASE, AMYLASE in the last 72 hours. Cardiac Enzymes No results for input(s): CKTOTAL, CKMB, CKMBINDEX, TROPONINI in the last 72 hours.  BNP: BNP (last 3 results) Recent Labs    07/11/19 0533  BNP 23.3    ProBNP (last 3 results) No results for input(s): PROBNP in the last 8760 hours.   D-Dimer No results for input(s): DDIMER in the last 72 hours. Hemoglobin A1C No results for input(s): HGBA1C in the last 72 hours. Fasting Lipid Panel Recent Labs    07/15/19 0915  TRIG 208*   Thyroid Function Tests No results for input(s): TSH, T4TOTAL, T3FREE, THYROIDAB in the last 72 hours.  Invalid input(s): FREET3  Other results:   Imaging    DG Chest 1 View  Result Date: 07/15/2019 CLINICAL DATA:  [place] respiratory distress syndrome. EXAM: CHEST  1 VIEW COMPARISON:  07/14/2019 FINDINGS: Bilateral airspace lung opacities have improved from the previous day's exam. No new lung abnormalities. No  convincing pleural effusion and no pneumothorax. Tracheostomy tube, nasal/orogastric tube and femoral a placed Swan-Ganz catheter are stable. IMPRESSION: 1. Bilateral airspace lung opacities have improved compared to the previous day's exam. 2. No other change. No new lung abnormalities. Stable support apparatus. Electronically Signed   By: Lajean Manes M.D.   On: 07/15/2019 08:54     Medications:     Scheduled Medications: . aspirin  81 mg Per Tube Daily  . chlorhexidine gluconate (MEDLINE KIT)  15 mL Mouth Rinse BID  . Chlorhexidine Gluconate Cloth  6 each Topical Daily  . digoxin  0.125 mg Per Tube Daily  . furosemide  40 mg Intravenous Once  . furosemide  40 mg Intravenous BID  . heparin  5,000 Units Subcutaneous Q8H  . insulin aspart  0-9 Units Subcutaneous Q4H  . mouth rinse  15 mL Mouth Rinse 10 times per day  . mouth rinse  15 mL Mouth Rinse BID  . pantoprazole sodium  40 mg Per Tube Daily  . potassium chloride  20 mEq Per Tube BID  . sodium chloride flush  10-40 mL Intracatheter Q12H  . sodium chloride flush  3 mL Intravenous Q12H  . spironolactone  12.5 mg Per Tube Daily  . ticagrelor  90 mg Per Tube BID    Infusions: . sodium chloride 10 mL/hr at 07/15/19 0450  . sodium chloride 20 mL/hr at 07/13/19 0600  . amiodarone 30 mg/hr (07/15/19 1452)  . ceFEPime (MAXIPIME) IV 2 g (07/15/19 1451)  . dexmedetomidine (PRECEDEX) IV infusion Stopped (07/14/19 6546)  . feeding supplement (VITAL HIGH PROTEIN) 1,000 mL (07/14/19 1100)  . levETIRAcetam Stopped (07/15/19 0425)  . milrinone 0.125 mcg/kg/min (07/15/19 0900)  . norepinephrine (LEVOPHED) Adult infusion Stopped (07/14/19 1814)  . propofol (DIPRIVAN) infusion 35 mcg/kg/min (07/15/19 1440)  . sodium chloride      PRN Medications: sodium chloride, acetaminophen (TYLENOL) oral liquid 160 mg/5 mL, acetaminophen, ondansetron (ZOFRAN) IV, sodium chloride flush, sodium chloride flush    Assessment/Plan   1. Witnessed Out  of Hospital VF Arrest in setting of anterior STEMI  - Witnessed by EMS w/ prompt initiation of ACLS - Estimated 40-50 min resucitation time - Secondary to acute anterior STEMI w/ proximal LAD occlusion, s/p PCI  on  - EF 15% - s/p hypothermia  - EEG with diffuse slowing.  Woke up on 3/12 and self extubated.  Remains intubated but will arouse and follow commands -Rhythm currently stable.  Keep Mg > 2.0, K > 4.0 - Can stop amio  2. Anterior STEMI - LHC showed 99% proximal LAD thrombotic occlusion, s/p PCI + DES placement. No other significant disease - No s/s ischemia - EF 15%.  - Continue DAPT w/ ASA + Brilinta + statin - no ? blocker w/ low output   3. Acute systolic HF -> Cardiogenic Shock  - initial echo 07/11/19 EF 15%. RV systolic function mildly reduced  - f/u echo on 3/13 LVEF 20-25% - He remains very tenuous.  Swan numbers improved today but still volume overloaded. - On milrinone 0.125. Co-ox 78%. Will continue for now - Continue IV diuresis with lasix 40 IV bid - Continue digoxin and spironolactone. - Hopefully we can stabilize him from a cardiac perspective without the need for mechanical support.  4. Acute hypoxic respiratory failure. -In setting of VF arrest. -Self extubated 3/12.  Reintubated 3/12 due to recurrent respiratory failure -Now on 40% FiO2 -Chest x-ray with improved multifocal pneumonia.  Suspect aspiration.  Procalcitonin is 2.4. Continue cefipime. Trach aspirate NGTD -Continue diuresis   5. Post MI Atrial Fibrillation: - Maintaining NSR. Stop amio -If recurs will need heparin.  5. Shock Liver:  - 2/2 Acute MI/ cardiogenic shock  - Remain elevated. Follow daily.   6. AKI:  - Resolved with hemodynamic support.   CRITICAL CARE Performed by: Glori Bickers  Total critical care time: 35 minutes  Critical care time was exclusive of separately billable procedures and treating other patients.  Critical care was necessary to treat or prevent  imminent or life-threatening deterioration.  Critical care was time spent personally by me (independent of midlevel providers or residents) on the following activities: development of treatment plan with patient and/or surrogate as well as nursing, discussions with consultants, evaluation of patient's response to treatment, examination of patient, obtaining history from patient or surrogate, ordering and performing treatments and interventions, ordering and review of laboratory studies, ordering and review of radiographic studies, pulse oximetry and re-evaluation of patient's condition.    Length of Stay: [place], MD  07/15/2019, 3:42 PM  Advanced Heart Failure Team Pager [phone] (M-F; [place])  Please contact [facility] for night-coverage after hours (4p -7a ) and weekends on [email or web address]

## 2019-07-16 ENCOUNTER — Encounter: Payer: Self-pay | Admitting: Radiology

## 2019-07-16 DIAGNOSIS — J9601 Acute respiratory failure with hypoxia: Secondary | ICD-10-CM

## 2019-07-16 LAB — BASIC METABOLIC PANEL
Anion gap: 17 — ABNORMAL HIGH (ref 5–15)
BUN: 27 mg/dL — ABNORMAL HIGH (ref 6–20)
CO2: 24 mmol/L (ref 22–32)
Calcium: 8.1 mg/dL — ABNORMAL LOW (ref 8.9–10.3)
Chloride: 98 mmol/L (ref 98–111)
Creatinine, Ser: 1.08 mg/dL (ref 0.61–1.24)
GFR calc Af Amer: >60 mL/min (ref >60)
GFR calc non Af Amer: >60 mL/min (ref >60)
Glucose, Bld: 237 mg/dL — ABNORMAL HIGH (ref 70–99)
Potassium: 3.4 mmol/L — ABNORMAL LOW (ref 3.5–5.1)
Sodium: 139 mmol/L (ref 135–145)

## 2019-07-16 LAB — CBC
HCT: 33 % — ABNORMAL LOW (ref 39.0–52.0)
Hemoglobin: 10.8 g/dL — ABNORMAL LOW (ref 13.0–17.0)
MCH: 31 pg (ref 26.0–34.0)
MCHC: 32.7 g/dL (ref 30.0–36.0)
MCV: 94.8 fL (ref 80.0–100.0)
Platelets: 128 10*3/uL — ABNORMAL LOW (ref 150–400)
RBC: 3.48 MIL/uL — ABNORMAL LOW (ref 4.22–5.81)
RDW: 13.6 % (ref 11.5–15.5)
WBC: 9.5 10*3/uL (ref 4.0–10.5)
nRBC: 0 % (ref 0.0–0.2)

## 2019-07-16 LAB — COOXEMETRY PANEL
Carboxyhemoglobin: 0.9 % (ref 0.5–1.5)
Methemoglobin: 0.9 % (ref 0.0–1.5)
O2 Saturation: 68.6 %
Total hemoglobin: 11.3 g/dL — ABNORMAL LOW (ref 12.0–16.0)

## 2019-07-16 LAB — COMPREHENSIVE METABOLIC PANEL
ALT: 93 U/L — ABNORMAL HIGH (ref 0–44)
AST: 124 U/L — ABNORMAL HIGH (ref 15–41)
Albumin: 2.1 g/dL — ABNORMAL LOW (ref 3.5–5.0)
Alkaline Phosphatase: 108 U/L (ref 38–126)
Anion gap: 11 (ref 5–15)
BUN: 29 mg/dL — ABNORMAL HIGH (ref 6–20)
CO2: 29 mmol/L (ref 22–32)
Calcium: 8.1 mg/dL — ABNORMAL LOW (ref 8.9–10.3)
Chloride: 103 mmol/L (ref 98–111)
Creatinine, Ser: 1.17 mg/dL (ref 0.61–1.24)
GFR calc Af Amer: >60 mL/min (ref >60)
GFR calc non Af Amer: >60 mL/min (ref >60)
Glucose, Bld: 131 mg/dL — ABNORMAL HIGH (ref 70–99)
Potassium: 3.2 mmol/L — ABNORMAL LOW (ref 3.5–5.1)
Sodium: 143 mmol/L (ref 135–145)
Total Bilirubin: 0.8 mg/dL (ref 0.3–1.2)
Total Protein: 5.6 g/dL — ABNORMAL LOW (ref 6.5–8.1)

## 2019-07-16 LAB — GLUCOSE, CAPILLARY
Glucose-Capillary: 102 mg/dL — ABNORMAL HIGH (ref 70–99)
Glucose-Capillary: 102 mg/dL — ABNORMAL HIGH (ref 70–99)
Glucose-Capillary: 103 mg/dL — ABNORMAL HIGH (ref 70–99)
Glucose-Capillary: 110 mg/dL — ABNORMAL HIGH (ref 70–99)
Glucose-Capillary: 119 mg/dL — ABNORMAL HIGH (ref 70–99)
Glucose-Capillary: 129 mg/dL — ABNORMAL HIGH (ref 70–99)
Glucose-Capillary: 131 mg/dL — ABNORMAL HIGH (ref 70–99)

## 2019-07-16 MED ORDER — AMIODARONE LOAD VIA INFUSION
150.0000 mg | Freq: Once | INTRAVENOUS | Status: AC
  Administered 2019-07-16: 150 mg via INTRAVENOUS
  Filled 2019-07-16: qty 83.34

## 2019-07-16 MED ORDER — POTASSIUM CHLORIDE 20 MEQ PO PACK
40.0000 meq | PACK | Freq: Once | ORAL | Status: AC
  Administered 2019-07-16: 40 meq via ORAL
  Filled 2019-07-16: qty 2

## 2019-07-16 MED ORDER — METOLAZONE 2.5 MG PO TABS
2.5000 mg | ORAL_TABLET | Freq: Once | ORAL | Status: AC
  Administered 2019-07-16: 2.5 mg via ORAL
  Filled 2019-07-16: qty 1

## 2019-07-16 MED ORDER — AMIODARONE HCL IN DEXTROSE 360-4.14 MG/200ML-% IV SOLN
30.0000 mg/h | INTRAVENOUS | Status: DC
  Administered 2019-07-16 – 2019-07-21 (×10): 30 mg/h via INTRAVENOUS
  Filled 2019-07-16 (×10): qty 200

## 2019-07-16 MED ORDER — POTASSIUM CHLORIDE 20 MEQ/15ML (10%) PO SOLN: 40.0000 meq | Freq: Two times a day (BID) | ORAL | Status: DC

## 2019-07-16 MED ORDER — CHLORHEXIDINE GLUCONATE 0.12 % MT SOLN: 15.0000 mL | Freq: Two times a day (BID) | OROMUCOSAL | Status: DC

## 2019-07-16 MED ORDER — SPIRONOLACTONE 25 MG PO TABS
25.0000 mg | ORAL_TABLET | Freq: Every day | ORAL | Status: DC
  Administered 2019-07-16: 25 mg
  Filled 2019-07-16: qty 1

## 2019-07-16 MED ORDER — POTASSIUM CHLORIDE 20 MEQ PO PACK
20.0000 meq | PACK | Freq: Two times a day (BID) | ORAL | Status: DC
  Administered 2019-07-16 – 2019-07-22 (×12): 20 meq via ORAL
  Filled 2019-07-16 (×13): qty 1

## 2019-07-16 MED ORDER — POTASSIUM CHLORIDE 20 MEQ/15ML (10%) PO SOLN
40.0000 meq | Freq: Two times a day (BID) | ORAL | Status: DC
  Administered 2019-07-16: 40 meq
  Filled 2019-07-16: qty 30

## 2019-07-16 MED ORDER — AMIODARONE HCL IN DEXTROSE 360-4.14 MG/200ML-% IV SOLN
60.0000 mg/h | INTRAVENOUS | Status: AC
  Administered 2019-07-16 (×2): 60 mg/h via INTRAVENOUS
  Filled 2019-07-16 (×2): qty 200

## 2019-07-16 MED ORDER — ORAL CARE MOUTH RINSE: 15.0000 mL | Freq: Two times a day (BID) | OROMUCOSAL | Status: DC

## 2019-07-16 NOTE — Progress Notes (Addendum)
Advanced Heart Failure Rounding Note  PCP-Cardiologist: No primary care provider on file.   Subjective:    Events:  3/10 admitted with anterior STEMI and VF arrest with prolonged resuscitation s/p PCI LAD 3/12 self extubated. Developed recurrent respiratory failure and reintubated  Yesterday diuresed with 40 mg IV lasix x2. Weight coming down. Negative -459.   Remains intubated. Sedation cut back earlier today.    Swan #s CVP 7 PA 43/22 (33) CO 8.8  CI 3.7  Co-ox 75%   Objective:   Weight Range: 101.3 kg Body mass index is 28.67 kg/m.   Vital Signs:   Temp:  [98.2 F (36.8 C)-99.5 F (37.5 C)] 99.5 F (37.5 C) (03/15 0700) Pulse Rate:  [90-100] 99 (03/15 0700) Resp:  [12-26] 16 (03/15 0700) BP: (104-127)/(59-80) 108/71 (03/15 0700) SpO2:  [96 %-100 %] 100 % (03/15 0700) FiO2 (%):  [40 %] 40 % (03/15 0324) Weight:  [101.3 kg] 101.3 kg (03/15 0600) Last BM Date: 07/15/19  Weight change: Filed Weights   07/14/19 0800 07/15/19 0500 07/16/19 0600  Weight: 108.2 kg 104.6 kg 101.3 kg    Intake/Output:   Intake/Output Summary (Last 24 hours) at 07/16/2019 0746 Last data filed at 07/16/2019 0700 Gross per 24 hour  Intake 5280.36 ml  Output 5740 ml  Net -459.64 ml      Physical Exam   CVP 7.  General:  Intubated. Opens eyes.  Follows commands.  HE[place]ETT Neck: supple. JVP difficult to assess. . Carotids 2+ bilat; no bruits. No lymphadenopathy or thryomegaly appreciated. Cor: PMI nondisplaced. Regular rate & rhythm. No rubs, gallops or murmurs. Lungs: clear Abdomen: soft, nontender, nondistended. No hepatosplenomegaly. No bruits or masses. Good bowel sounds. Extremities: no cyanosis, clubbing, rash, R and LLE 2+ edema. R radial a line Neuro: intubated.    Telemetry   Sinus rhythm 90s.  Personally reviewed   Labs    CBC Recent Labs    07/15/19 0339 07/16/19 0417  WBC 10.2 9.5  HGB 10.6* 10.8*  HCT 31.1* 33.0*  MCV 93.7 94.8  PLT 120* 128*    Basic Metabolic Panel Recent Labs    07/15/19 0339 07/15/19 1838 07/16/19 0417  NA 139  --  143  K 4.9  --  3.2*  CL 106  --  103  CO2 23  --  29  GLUCOSE 99  --  131*  BUN 33*  --  29*  CREATININE 1.29*  --  1.17  CALCIUM 7.5*  --  8.1*  MG 2.5* 2.4  --   PHOS 1.5* 3.1  --    Liver Function Tests Recent Labs    07/15/19 0339 07/16/19 0417  AST 184* 124*  ALT 100* 93*  ALKPHOS 84 108  BILITOT 1.4* 0.8  PROT 5.0* 5.6*  ALBUMIN 2.1* 2.1*   No results for input(s): LIPASE, AMYLASE in the last 72 hours. Cardiac Enzymes No results for input(s): CKTOTAL, CKMB, CKMBINDEX, TROPONINI in the last 72 hours.  BNP: BNP (last 3 results) Recent Labs    07/11/19 0533  BNP 23.3    ProBNP (last 3 results) No results for input(s): PROBNP in the last 8760 hours.   D-Dimer No results for input(s): DDIMER in the last 72 hours. Hemoglobin A1C No results for input(s): HGBA1C in the last 72 hours. Fasting Lipid Panel Recent Labs    07/15/19 0915  TRIG 208*   Thyroid Function Tests No results for input(s): TSH, T4TOTAL, T3FREE, THYROIDAB in the last 72 hours.  Invalid input(s): FREET3  Other results:   Imaging    No results found.   Medications:     Scheduled Medications: . aspirin  81 mg Per Tube Daily  . chlorhexidine gluconate (MEDLINE KIT)  15 mL Mouth Rinse BID  . Chlorhexidine Gluconate Cloth  6 each Topical Daily  . digoxin  0.125 mg Per Tube Daily  . furosemide  40 mg Intravenous Once  . furosemide  40 mg Intravenous BID  . heparin  5,000 Units Subcutaneous Q8H  . insulin aspart  0-9 Units Subcutaneous Q4H  . mouth rinse  15 mL Mouth Rinse 10 times per day  . mouth rinse  15 mL Mouth Rinse BID  . pantoprazole sodium  40 mg Per Tube Daily  . potassium chloride  20 mEq Per Tube BID  . sodium chloride flush  10-40 mL Intracatheter Q12H  . sodium chloride flush  3 mL Intravenous Q12H  . spironolactone  12.5 mg Per Tube Daily  . ticagrelor  90 mg  Per Tube BID    Infusions: . sodium chloride 10 mL/hr at 07/15/19 0450  . sodium chloride 20 mL/hr at 07/13/19 0600  . amiodarone 30 mg/hr (07/16/19 0700)  . ceFEPime (MAXIPIME) IV Stopped (07/16/19 0544)  . dexmedetomidine (PRECEDEX) IV infusion Stopped (07/14/19 8937)  . feeding supplement (VITAL HIGH PROTEIN) 70 mL/hr at 07/16/19 0700  . levETIRAcetam Stopped (07/16/19 0407)  . milrinone 0.125 mcg/kg/min (07/16/19 0700)  . norepinephrine (LEVOPHED) Adult infusion Stopped (07/14/19 1814)  . propofol (DIPRIVAN) infusion 40 mcg/kg/min (07/16/19 0700)  . sodium chloride      PRN Medications: sodium chloride, acetaminophen (TYLENOL) oral liquid 160 mg/5 mL, acetaminophen, ondansetron (ZOFRAN) IV, sodium chloride flush, sodium chloride flush    Assessment/Plan   1. Witnessed Out of Hospital VF Arrest in setting of anterior STEMI  - Witnessed by EMS w/ prompt initiation of ACLS - Estimated 40-50 min resucitation time - Secondary to acute anterior STEMI w/ proximal LAD occlusion, s/p PCI on  - EF 15% - s/p hypothermia  - EEG with diffuse slowing.  Woke up on 3/12 and self extubated.  Remains intubated but will arouse and follow commands -Rhythm currently stable.  Keep Mg > 2.0, K > 4.0 - Stop amio    2. Anterior STEMI - LHC showed 99% proximal LAD thrombotic occlusion, s/p PCI + DES placement. No other significant disease - No s/s ischemia - EF 15%.  - Continue DAPT w/ ASA + Brilinta + statin - no ? blocker w/ low output   3. Acute systolic HF -> Cardiogenic Shock  - initial echo 07/11/19 EF 15%. RV systolic function mildly reduced  - f/u echo on 3/13 LVEF 20-25% -- On milrinone 0.125. Co-ox  69%   - CVP 7 but remains overloaded. Remains volume overloaded but improving. Continue IV lasix 40 mg twice a day.  - Continue digoxin - Increase spironolactone daily. Supp K  4. Acute hypoxic respiratory failure. -In setting of VF arrest. -Self extubated 3/12.  Reintubated 3/12  due to recurrent respiratory failure -Now on 40% FiO2 -Chest x-ray 3/14 with improved multifocal pneumonia.  Suspect aspiration.  Procalcitonin is 2.4. Continue cefipime. Trach aspirate NGTD -Continue diuresis   5. Post MI Atrial Fibrillation: - Maintaining NSR. Stop amio drip.  -If recurs will need heparin.  5. Shock Liver:  - 2/2 Acute MI/ cardiogenic shock  - LFTs trending down. .   6. AKI:  - Resolved with hemodynamic support.   Length of Stay:  [place], NP  07/16/2019, 7:46 AM  Advanced Heart Failure Team Pager [phone] (M-F; 7a - 4p)  Please contact [facility] for night-coverage after hours (4p -7a ) and weekends on [email or web address]   Agree with above  Awake on vent. Following commands. Swan numbers look good on low-dose milrinone. Rhythm stable. Still volume overloaded. On PS 40% with good TVs  General:  Awake on vent HEENT: normal + ETT Neck: supple. JVP 7 . Carotids 2+ bilat; no bruits. No lymphadenopathy or thryomegaly appreciated. Cor: PMI nondisplaced. Regular rate & rhythm. No rubs, gallops or murmurs. Lungs: clear Abdomen: soft, nontender, nondistended. No hepatosplenomegaly. No bruits or masses. Good bowel sounds. Extremities: no cyanosis, clubbing, rash, 1-2+ edema Neuro: awake on vent follows commands   Improving slowly. Will diurese aggressively this am and then plan possible extubation this afternoon. D/w CCM at bedside. Can stop amio. Leave swan and milrinone going until stable after extubation. Can likely pull/stop tomorrow. Titrate HF meds. Increase spiro to 25.   CRITICAL CARE Performed by: Glori Bickers  Total critical care time: 35 minutes  Critical care time was exclusive of separately billable procedures and treating other patients.  Critical care was necessary to treat or prevent imminent or life-threatening deterioration.  Critical care was time spent personally by me (independent of midlevel providers or residents) on the  following activities: development of treatment plan with patient and/or surrogate as well as nursing, discussions with consultants, evaluation of patient's response to treatment, examination of patient, obtaining history from patient or surrogate, ordering and performing treatments and interventions, ordering and review of laboratory studies, ordering and review of radiographic studies, pulse oximetry and re-evaluation of patient's condition.  Glori Bickers, MD  8:43 AM

## 2019-07-16 NOTE — Progress Notes (Addendum)
NAME:  Edward Mendoza, MRN:  [id number], DOB:  [birth year], LOS: 5 ADMISSION DATE:  07/11/2019, CONSULTATION DATE:   REFERRING MD:  Dr. Scarlette Calico , CHIEF COMPLAINT:  Cardiac arrest  Brief History   49 year old male, 1ppd smoker, admitted 3/10 s/p VF/VT cardaic arrest in setting of anterior STEMI. Underwent 45-50 minutes of CPR before ROSC.  LHC with 99% proximal-LAD occlusion s/p PCI with 3.5 x 18 Onyx DES. Right heart cath demonstrated mildly elevated filling pressures (RA 12, PCW 23) and preserve cardiac output with MVO2 67%.     Past Medical History  Tobacco Abuse  Significant Hospital Events   3/10 Admit post VF/VT arrest. Cath performed with stent placement to LAD  Consults:  PCCM  Procedures:  Swan 3/10 >> ETT 3/10 >> 3/13 (self extubated), 3/14 >>  PCI 07/11/19  Significant Diagnostic Tests:   CT head  3/10 >> R cerebellar infarct which appears remote or subacute in nature.   CT Head 3/13 >> new wedge-shaped hypodensity involving the parasagittal right occipital lobe, suspicious for an evolving acute ischemic infarct, right PCA territory, additional subtle hypodensity at the subdcortical white matter of the anterior right frontal lobe, chronic right cerebellar infarct   TTE 3/10 >> LVEF ~ 15%, LV has severely decreased function with global hypokinesis, moderate LVH, RV systolic function mildly reduced, trivial MVR  TTE 3/13 >> LVEF ~ 20-25%, LV has severely decreased function, severe hypokinesis of the LV, entire anteroseptal wall, anterior wall and apical segment, moderate hypokinesis of the LV, mid-apical inferior wall, trivial pericardial effusion  Micro Data:  COVID 3/10 >> negative  MRSA PCR 3/10 >> negative Tracheal aspirate 3/13 >>    Antimicrobials:  Cefepime 3/13 >>   Interim history/subjective:  Remains on vent - 40% FiO2, PEEP 5 Glucose range 100-130 Tmax 99.7 / WBC 9.5  I/O 5.6L UOP, net neg No acute events overnight.  Objective   Blood pressure  108/71, pulse 99, temperature 99.5 F (37.5 C), resp. rate 16, height 6\' 2"  (1.88 m), weight 101.3 kg, SpO2 100 %. PAP: (27-73)/(19-33) 43/21 CVP:  [3 mmHg-17 mmHg] 9 mmHg CO:  [7 L/min-9 L/min] 9 L/min CI:  [2.9 L/min/m2-3.8 L/min/m2] 3.8 L/min/m2  Vent Mode: SIMV;PSV;PRVC FiO2 (%):  [40 %] 40 % Set Rate:  [8 bmp] 8 bmp Vt Set:  [650 mL] 650 mL PEEP:  [8 cmH20] 8 cmH20 Plateau Pressure:  [10 cmH20-14 cmH20] 10 cmH20   Intake/Output Summary (Last 24 hours) at 07/16/2019 0811 Last data filed at 07/16/2019 0700 Gross per 24 hour  Intake 5069.18 ml  Output 5740 ml  Net -670.82 ml   Filed Weights   07/14/19 0800 07/15/19 0500 07/16/19 0600  Weight: 108.2 kg 104.6 kg 101.3 kg    Examination: General: critically ill appearing adult male lying in bed in NAD HEENT: MM pink/moist, ETT, anicteric  Neuro: sedate on propofol, opens eyes to voice, nods / follows commands CV: s1s2 RRR, no m/r/g, JVP ~7-8cm PULM: non-labored on vent, lungs bilaterally clear  GI: soft, bsx4 active  Extremities: warm/dry, generalized 2+ edema.  R Femoral 07/18/19 c/d/i Skin: no rashes or lesions  Resolved Hospital Problem list      Assessment & Plan:   VT/ VF Cardiac Arrest 2/2 Anterior STEMI s/p PCI w/ pLAD DES  Cardiogenic Shock  Acute Systolic CHF  AF  Approximate Theone Murdoch minute downtime, reduced LVEF recovered to ~20%.   -appreciate CHF Service assistance with patient care -defer milrinone / inotropes to  CHF team  -follow SvO2 -diuresis as renal function / BP permit  -continue ASA, digoxin, lasix BID, spironolactone -ICU monitoring   Acute Encephalopathy post Cardiac Arrest, Strokes on CT head -supportive care -wean sedation for RASS Goal of 0 to -1 -keppra BID  -follow triglycerides while on propofol  -await MRI brain  Acute Respiratory Failure in setting of Cardiac Arrest Multifocal Infiltrates suspicious for Aspiration vs. HCAP -PSV wean as tolerated with goal for extubation  -PRVC  8cc/kg as rest mode -continue cefepime, D3/x.  Consider 5-7 days pending clinical course.  -VAP prevention measures  -follow intermittent CXR   AKI Mixed Respiratory, Metabolic NGMA  AKI in setting of hypotension  -Trend BMP / urinary output -Replace electrolytes as indicated -Avoid nephrotoxic agents, ensure adequate renal perfusion -continue active diuresis   Hypokalemia  -monitor and replace as indicated   Shock Liver -improving, follow LFT's   Hypoglycemia  -follow CBG   At Risk Malnutrition  -TF per Nutrition   Best practice:  Diet: TF Pain/Anxiety/Delirium protocol (if indicated): propofol/ fentanyl, RASS goal 0 to -1  VAP protocol (if indicated): yes DVT prophylaxis: heparin SQ GI prophylaxis: PPI Glucose control: SSI sensitive  Mobility: BR Code Status: Full  Family Communication: Will update family on arrival 3/15 Disposition:  2H ICU   CC Time: 28 minutes    Noe Gens, MSN, NP-C [facility] 07/16/2019, 8:11 AM   Please see [email or web address] for pager details.

## 2019-07-16 NOTE — Progress Notes (Signed)
Per speech, okay to give pills crushed in applesauce and sips of water at this time. Will reevaluate tomorrow.

## 2019-07-16 NOTE — Progress Notes (Signed)
Patient back in a fib 100s-110s. Bensimhon MD notified. Ordered to bolus with 150 mg of amiodarone and start the drip per protocol.

## 2019-07-16 NOTE — Procedures (Signed)
Extubation Procedure Note  Patient Details:   Name: Edward Mendoza DOB: [birth year] MRN: [id number]   Airway Documentation:    Vent end date: 07/16/19 Vent end time: 1011   Evaluation  O2 sats: stable throughout Complications: No apparent complications Patient did tolerate procedure well. Bilateral Breath Sounds: Diminished  Patient able to speak: Yes  Peggye Form 07/16/2019, 10:14 AM

## 2019-07-16 NOTE — Progress Notes (Signed)
SLP Cancellation Note  Patient Details Name: Edward Mendoza MRN: [id number] DOB: [birth year]   Cancelled treatment:       Reason Eval/Treat Not Completed: Patient not medically ready, remains intubated. Will follow.    Mahala Menghini., M.A. CCC-SLP Acute Rehabilitation Services Pager [phone] Office [phone]  07/16/2019, 7:06 AM

## 2019-07-16 NOTE — Progress Notes (Signed)
Left radial arterial line discontinued per Vassie Loll MD d/t bleeding and no need for it anymore. Left peripheral IV also removed for infiltration. Propofol stopped as well as tube feeds and patient extubated per Vassie Loll MD order.

## 2019-07-16 NOTE — Evaluation (Signed)
Clinical/Bedside Swallow Evaluation Patient Details  Name: Edward Mendoza MRN: [id number] Date of Birth: 07/29/1970  Today's Date: 07/16/2019 Time: SLP Start Time (ACUTE ONLY): 1420 SLP Stop Time (ACUTE ONLY): 1440 SLP Time Calculation (min) (ACUTE ONLY): 20 min  Past Medical History:  Past Medical History:  Diagnosis Date  . Smoker 07/13/2019   Past Surgical History:  Past Surgical History:  Procedure Laterality Date  . ARTERIAL LINE INSERTION N/A 07/11/2019   Procedure: ARTERIAL LINE INSERTION;  Surgeon: Marykay Lex, MD;  Location: [facility] INVASIVE CV LAB;  Service: Cardiovascular;  Laterality: N/A;  . CORONARY STENT INTERVENTION N/A 07/11/2019   Procedure: CORONARY STENT INTERVENTION;  Surgeon: Marykay Lex, MD;  Location: [facility] INVASIVE CV LAB;  Service: Cardiovascular;  Laterality: N/A;  . CORONARY/GRAFT ACUTE MI REVASCULARIZATION N/A 07/11/2019   Procedure: Coronary/Graft Acute MI Revascularization;  Surgeon: Marykay Lex, MD;  Location: [facility] INVASIVE CV LAB;  Service: Cardiovascular;  Laterality: N/A;  . RIGHT/LEFT HEART CATH AND CORONARY ANGIOGRAPHY N/A 07/11/2019   Procedure: RIGHT/LEFT HEART CATH AND CORONARY ANGIOGRAPHY;  Surgeon: Marykay Lex, MD;  Location: [facility] INVASIVE CV LAB;  Service: Cardiovascular;  Laterality: N/A;   HPI:  49 year old male, 1ppd smoker, admitted 3/10 s/p VF/VT cardaic arrest in setting of anterior STEMI. Underwent 45-50 minutes of CPR before ROSC.  LHC with 99% proximal-LAD occlusion s/p PCI with 3.5 x 18 Onyx DES. Right heart cath demonstrated mildly elevated filling pressures (RA 12, PCW 23) and preserve cardiac output with MVO2 67%.   Intubated from 3/10-3/13 (self extubated) reintubated 3/14-3/15.    Assessment / Plan / Recommendation Clinical Impression  Pt demonstrates AMS, inability to sit upright due to Swan-Ganz. Pt is positioned with HOB elevated to 30 degrees and then further elevated with reverse trendelenburg. His vocal quality is clear  and he endorses pain with volitional coughing. Pt was able to consume 4 oz of puree with total assist feeding. However when given sips of water, he sometimes plays with the bolus, open his mouth to show it to me or other abnormal behaviors that detract from oral containment and attention to swallow. When on task, pt was able to complete 3 oz water swallow without coughing, but at other times when less attentive he had immediate hard coughingcoughing. Recommend pt take meds whole in puree and be offered sips of water if tolerated. Otherwise, will f/u for ability to upgrade to meals when able to sit upright, RN thinks tomorrow.  SLP Visit Diagnosis: Dysphagia, oropharyngeal phase (R13.12)    Aspiration Risk  Mild aspiration risk    Diet Recommendation NPO except meds(sips of water)   Liquid Administration via: Straw Medication Administration: Whole meds with puree    Other  Recommendations     Follow up Recommendations Inpatient Rehab      Frequency and Duration            Prognosis        Swallow Study   General HPI: 49 year old male, 1ppd smoker, admitted 3/10 s/p VF/VT cardaic arrest in setting of anterior STEMI. Underwent 45-50 minutes of CPR before ROSC.  LHC with 99% proximal-LAD occlusion s/p PCI with 3.5 x 18 Onyx DES. Right heart cath demonstrated mildly elevated filling pressures (RA 12, PCW 23) and preserve cardiac output with MVO2 67%.   Intubated from 3/10-3/13 (self extubated) reintubated 3/14-3/15.  Type of Study: Bedside Swallow Evaluation Previous Swallow Assessment: none Diet Prior to this Study: NPO Temperature Spikes Noted: No Respiratory  Status: Room air History of Recent Intubation: Yes Length of Intubations (days): 7 days Date extubated: 07/16/19 Behavior/Cognition: Alert;Cooperative;Confused;Distractible Oral Cavity Assessment: Within Functional Limits Oral Care Completed by SLP: No Oral Cavity - Dentition: Adequate natural dentition Vision: Functional for  self-feeding Self-Feeding Abilities: Total assist Patient Positioning: Postural control interferes with function(Pt unable to bend at the waist - [place]) Baseline Vocal Quality: Normal Volitional Cough: Other (Comment)(pain - CPR) Volitional Swallow: Able to elicit    Oral/Motor/Sensory Function Overall Oral Motor/Sensory Function: Within functional limits   Ice Chips Ice chips: Impaired Presentation: Spoon Pharyngeal Phase Impairments: Cough - Immediate   Thin Liquid Thin Liquid: Impaired Presentation: Straw Pharyngeal  Phase Impairments: Cough - Immediate    Nectar Thick Nectar Thick Liquid: Not tested   Honey Thick Honey Thick Liquid: Not tested   Puree Puree: Within functional limits Presentation: Spoon   Solid     Solid: Not tested     Edward [place], MA [place] Pager [phone] Office [phone]  Edward Mendoza 07/16/2019,2:56 PM

## 2019-07-17 ENCOUNTER — Inpatient Hospital Stay: Payer: Self-pay

## 2019-07-17 ENCOUNTER — Inpatient Hospital Stay (HOSPITAL_COMMUNITY): Payer: Managed Care, Other (non HMO)

## 2019-07-17 LAB — CBC
HCT: 35.6 % — ABNORMAL LOW (ref 39.0–52.0)
Hemoglobin: 11.7 g/dL — ABNORMAL LOW (ref 13.0–17.0)
MCH: 30.9 pg (ref 26.0–34.0)
MCHC: 32.9 g/dL (ref 30.0–36.0)
MCV: 93.9 fL (ref 80.0–100.0)
Platelets: 158 10*3/uL (ref 150–400)
RBC: 3.79 MIL/uL — ABNORMAL LOW (ref 4.22–5.81)
RDW: 13.3 % (ref 11.5–15.5)
WBC: 13.3 10*3/uL — ABNORMAL HIGH (ref 4.0–10.5)
nRBC: 0 % (ref 0.0–0.2)

## 2019-07-17 LAB — COMPREHENSIVE METABOLIC PANEL
ALT: 93 U/L — ABNORMAL HIGH (ref 0–44)
AST: 107 U/L — ABNORMAL HIGH (ref 15–41)
Albumin: 2.2 g/dL — ABNORMAL LOW (ref 3.5–5.0)
Alkaline Phosphatase: 125 U/L (ref 38–126)
Anion gap: 13 (ref 5–15)
BUN: 32 mg/dL — ABNORMAL HIGH (ref 6–20)
CO2: 26 mmol/L (ref 22–32)
Calcium: 8.6 mg/dL — ABNORMAL LOW (ref 8.9–10.3)
Chloride: 99 mmol/L (ref 98–111)
Creatinine, Ser: 1.3 mg/dL — ABNORMAL HIGH (ref 0.61–1.24)
GFR calc Af Amer: >60 mL/min (ref >60)
GFR calc non Af Amer: >60 mL/min (ref >60)
Glucose, Bld: 169 mg/dL — ABNORMAL HIGH (ref 70–99)
Potassium: 3.5 mmol/L (ref 3.5–5.1)
Sodium: 138 mmol/L (ref 135–145)
Total Bilirubin: 1.1 mg/dL (ref 0.3–1.2)
Total Protein: 6.2 g/dL — ABNORMAL LOW (ref 6.5–8.1)

## 2019-07-17 LAB — BASIC METABOLIC PANEL
Anion gap: 12 (ref 5–15)
BUN: 35 mg/dL — ABNORMAL HIGH (ref 6–20)
CO2: 26 mmol/L (ref 22–32)
Calcium: 8.8 mg/dL — ABNORMAL LOW (ref 8.9–10.3)
Chloride: 99 mmol/L (ref 98–111)
Creatinine, Ser: 1.34 mg/dL — ABNORMAL HIGH (ref 0.61–1.24)
GFR calc Af Amer: >60 mL/min (ref >60)
GFR calc non Af Amer: >60 mL/min (ref >60)
Glucose, Bld: 163 mg/dL — ABNORMAL HIGH (ref 70–99)
Potassium: 3.8 mmol/L (ref 3.5–5.1)
Sodium: 137 mmol/L (ref 135–145)

## 2019-07-17 LAB — COOXEMETRY PANEL
Carboxyhemoglobin: 1.3 % (ref 0.5–1.5)
Methemoglobin: 0.7 % (ref 0.0–1.5)
O2 Saturation: 72.6 %
Total hemoglobin: 11.6 g/dL — ABNORMAL LOW (ref 12.0–16.0)

## 2019-07-17 LAB — CULTURE, RESPIRATORY W GRAM STAIN: Gram Stain: NONE SEEN

## 2019-07-17 LAB — GLUCOSE, CAPILLARY
Glucose-Capillary: 97 mg/dL (ref 70–99)
Glucose-Capillary: 113 mg/dL — ABNORMAL HIGH (ref 70–99)
Glucose-Capillary: 104 mg/dL — ABNORMAL HIGH (ref 70–99)
Glucose-Capillary: 98 mg/dL (ref 70–99)
Glucose-Capillary: 106 mg/dL — ABNORMAL HIGH (ref 70–99)

## 2019-07-17 MED ORDER — SODIUM CHLORIDE 0.9% FLUSH
10.0000 mL | Freq: Two times a day (BID) | INTRAVENOUS | Status: DC
  Administered 2019-07-17 – 2019-07-22 (×8): 10 mL

## 2019-07-17 MED ORDER — ASPIRIN 81 MG PO CHEW
81.0000 mg | CHEWABLE_TABLET | Freq: Every day | ORAL | Status: DC
  Administered 2019-07-17 – 2019-07-22 (×6): 81 mg via ORAL
  Filled 2019-07-17 (×6): qty 1

## 2019-07-17 MED ORDER — PANTOPRAZOLE SODIUM 40 MG PO TBEC
40.0000 mg | DELAYED_RELEASE_TABLET | Freq: Every day | ORAL | Status: DC
  Administered 2019-07-17 – 2019-07-22 (×6): 40 mg via ORAL
  Filled 2019-07-17 (×6): qty 1

## 2019-07-17 MED ORDER — POTASSIUM CHLORIDE CRYS ER 20 MEQ PO TBCR
20.0000 meq | EXTENDED_RELEASE_TABLET | Freq: Two times a day (BID) | ORAL | Status: DC
  Administered 2019-07-17: 20 meq via ORAL
  Filled 2019-07-17: qty 1

## 2019-07-17 MED ORDER — SODIUM CHLORIDE 0.9% FLUSH: 10.0000 mL | INTRAVENOUS | Status: DC | PRN

## 2019-07-17 MED ORDER — LEVETIRACETAM 250 MG PO TABS
500.0000 mg | ORAL_TABLET | Freq: Two times a day (BID) | ORAL | Status: DC
  Administered 2019-07-17 – 2019-07-18 (×2): 500 mg via ORAL
  Filled 2019-07-17 (×2): qty 2

## 2019-07-17 MED ORDER — FUROSEMIDE 10 MG/ML IJ SOLN: 40.0000 mg | Freq: Every day | INTRAMUSCULAR | Status: DC

## 2019-07-17 MED ORDER — CLOPIDOGREL BISULFATE 75 MG PO TABS
75.0000 mg | ORAL_TABLET | Freq: Every day | ORAL | Status: DC
  Administered 2019-07-18 – 2019-07-22 (×5): 75 mg via ORAL
  Filled 2019-07-17 (×6): qty 1

## 2019-07-17 MED ORDER — TICAGRELOR 90 MG PO TABS
90.0000 mg | ORAL_TABLET | Freq: Two times a day (BID) | ORAL | Status: DC
  Administered 2019-07-17: 90 mg via ORAL
  Filled 2019-07-17: qty 1

## 2019-07-17 MED ORDER — APIXABAN 5 MG PO TABS
5.0000 mg | ORAL_TABLET | Freq: Two times a day (BID) | ORAL | Status: DC
  Administered 2019-07-17 – 2019-07-22 (×11): 5 mg via ORAL
  Filled 2019-07-17 (×11): qty 1

## 2019-07-17 MED ORDER — POTASSIUM CHLORIDE CRYS ER 20 MEQ PO TBCR
40.0000 meq | EXTENDED_RELEASE_TABLET | Freq: Once | ORAL | Status: AC
  Administered 2019-07-17: 40 meq via ORAL
  Filled 2019-07-17: qty 2

## 2019-07-17 MED ORDER — FUROSEMIDE 40 MG PO TABS
40.0000 mg | ORAL_TABLET | Freq: Two times a day (BID) | ORAL | Status: DC
  Administered 2019-07-17: 40 mg via ORAL
  Filled 2019-07-17: qty 1

## 2019-07-17 MED ORDER — SPIRONOLACTONE 25 MG PO TABS
25.0000 mg | ORAL_TABLET | Freq: Every day | ORAL | Status: DC
  Administered 2019-07-17 – 2019-07-22 (×6): 25 mg via ORAL
  Filled 2019-07-17 (×6): qty 1

## 2019-07-17 MED ORDER — ACETAMINOPHEN 160 MG/5ML PO SOLN
650.0000 mg | ORAL | Status: DC | PRN
  Administered 2019-07-17 – 2019-07-21 (×5): 650 mg via ORAL
  Filled 2019-07-17 (×5): qty 20.3

## 2019-07-17 MED ORDER — PANTOPRAZOLE SODIUM 40 MG PO PACK
40.0000 mg | PACK | Freq: Every day | ORAL | Status: DC
  Filled 2019-07-17: qty 20

## 2019-07-17 MED ORDER — CLOPIDOGREL BISULFATE 300 MG PO TABS
600.0000 mg | ORAL_TABLET | Freq: Once | ORAL | Status: AC
  Administered 2019-07-17: 600 mg via ORAL
  Filled 2019-07-17: qty 2

## 2019-07-17 NOTE — Progress Notes (Signed)
  Speech Language Pathology Treatment: Dysphagia  Patient Details Name: Edward Mendoza MRN: [id number] DOB: 1970-08-25 Today's Date: 07/17/2019 Time: 1035-1050 SLP Time Calculation (min) (ACUTE ONLY): 15 min  Assessment / Plan / Recommendation Clinical Impression  Pt was less confused during this treatment session than reported in previous date's bedside swallow evaluation. Pt was consuming ice chips and had been placed on dysphagia 1 diet by MD with thin liquids via straw for medicine. SLP faciiltiated session by providing advanced trials of solids and consecutive sips of thin liquids via straw. Nursing was also present and pt consumed medicine whole with thin liquids via straw. Pt was free of overt s/s of aspiration or dysphagia. Out of an abundance of caution, pt upgraded to dysphagia 3 with thin liquids and medicine whole with thin liquids. Pt's wife was present and education provided to her as well as pt's nurse. ST to continue to follow.     HPI HPI: 49 year old male, 1ppd smoker, admitted 3/10 s/p VF/VT cardaic arrest in setting of anterior STEMI. Underwent 45-50 minutes of CPR before ROSC.  LHC with 99% proximal-LAD occlusion s/p PCI with 3.5 x 18 Onyx DES. Right heart cath demonstrated mildly elevated filling pressures (RA 12, PCW 23) and preserve cardiac output with MVO2 67%.   Intubated from 3/10-3/13 (self extubated) reintubated 3/14-3/15.       SLP Plan  Continue with current plan of care       Recommendations  Diet recommendations: Dysphagia 3 (mechanical soft);Thin liquid Liquids provided via: Cup;Straw Medication Administration: Whole meds with liquid Supervision: Staff to assist with self feeding;Full supervision/cueing for compensatory strategies Compensations: Minimize environmental distractions;Slow rate;Small sips/bites Postural Changes and/or Swallow Maneuvers: Seated upright 90 degrees                General recommendations: Rehab consult Oral Care  Recommendations: Oral care BID Follow up Recommendations: Inpatient Rehab SLP Visit Diagnosis: Dysphagia, oropharyngeal phase (R13.12) Plan: Continue with current plan of care       GO                Edward Mendoza 07/17/2019, 3:54 PM

## 2019-07-17 NOTE — Progress Notes (Signed)
Pharmacy Antibiotic Note  Edward Mendoza is a 49 y.o. male admitted on 07/11/2019 with reported chest pain for several days and witnessed VF arrest when EMS arrived, after ROSC was achieved patient was found to have STEMI, now s/p PCI.  Pharmacy has been consulted for cefepime dosing for PNA. Chest x-ray on 3/13 found multifocal patchy opacities suggesting multifocal pneumonia suspicious for infection/aspiration. Patient has been receiving cefepime 2g IV q8h. WBC 13.3. Tmax 100.6.   Plan: Continue cefepime 2g IV q8h Monitor renal function, cultures/sensitivites, clinical progression, and length of therapy  Height: 6\' 2"  (188 cm) Weight: 211 lb 6.7 oz (95.9 kg) IBW/kg (Calculated) : 82.2  Temp (24hrs), Avg:100.2 F (37.9 C), Min:99.1 F (37.3 C), Max:100.6 F (38.1 C)  Recent Labs  Lab 07/11/19 0538 07/11/19 0717 07/12/19 0357 07/13/19 0458 07/14/19 0432 07/14/19 0854 07/15/19 0339 07/16/19 0417 07/16/19 1735 07/17/19 0329  WBC  --    < > 20.5*  --   --  13.8* 10.2 9.5  --  13.3*  CREATININE  --    < > 2.08*   < > 1.14  --  1.29* 1.17 1.08 1.30*  LATICACIDVEN 1.8  --   --   --   --   --   --   --   --   --    < > = values in this interval not displayed.    Estimated Creatinine Clearance: 82.6 mL/min (A) (by C-G formula based on SCr of 1.3 mg/dL (H)).    Not on File  Antimicrobials this admission: Unasyn 3/10 >> 3/12 Cefepime 3/13 >>   Dose adjustments this admission: N/A  Microbiology results: 3/10 MRSA PCR: negative 3/13 respiratory cx: no organisms; reincubated   Thank you for allowing pharmacy to be a part of this patient's care.  4/13, PharmD PGY1 Pharmacy Resident [employer]: [phone]  07/17/2019 7:01 AM

## 2019-07-17 NOTE — Progress Notes (Addendum)
Advanced Heart Failure Rounding Note  PCP-Cardiologist: No primary care provider on file.   Subjective:    Events:  3/10 admitted with anterior STEMI and VF arrest with prolonged resuscitation s/p PCI LAD 3/12 self extubated. Developed recurrent respiratory failure and reintubated 3/15 Extubated   Yesterday amio was stopped but later restarted due to A flutter. Also diuresed with IV lasix. Brisk diuresis noted. Weight down 12 pounds. Creatinine trending up 1.1>1.3.    Denies SOB.   Swan #s CVP 4 PA 33/18 CO 6.7  CI 2.8  SVR 976 Co-ox 73    Objective:   Weight Range: 95.9 kg Body mass index is 27.14 kg/m.   Vital Signs:   Temp:  [99.1 F (37.3 C)-100.6 F (38.1 C)] 100 F (37.8 C) (03/16 0700) Pulse Rate:  [99-117] 102 (03/16 0700) Resp:  [17-28] 24 (03/16 0700) BP: (95-128)/(64-84) 102/67 (03/16 0700) SpO2:  [95 %-100 %] 96 % (03/16 0700) Weight:  [95.9 kg] 95.9 kg (03/16 0500) Last BM Date: 07/16/19  Weight change: Filed Weights   07/15/19 0500 07/16/19 0600 07/17/19 0500  Weight: 104.6 kg 101.3 kg 95.9 kg    Intake/Output:   Intake/Output Summary (Last 24 hours) at 07/17/2019 0823 Last data filed at 07/17/2019 0400 Gross per 24 hour  Intake 1400.51 ml  Output 4920 ml  Net -3519.49 ml      Physical Exam   CVP 4  General:  In bed. No resp difficulty HEENT: normal Neck: supple. JVP 9-10 . Carotids 2+ bilat; no bruits. No lymphadenopathy or thryomegaly appreciated. Cor: PMI nondisplaced. Regular rate & rhythm. No rubs, gallops or murmurs. Lungs: Coarse throughout on 2 liters [place].  Abdomen: soft, nontender, nondistended. No hepatosplenomegaly. No bruits or masses. Good bowel sounds. Extremities: no cyanosis, clubbing, rash, R and LLE ted hose. Trace edema. R groin swan.  Neuro: alert & orientedx3, cranial nerves grossly intact. moves all 4 extremities w/o difficulty. Affect pleasant   Telemetry  A flutter 100s    Labs    CBC Recent Labs      07/16/19 0417 07/17/19 0329  WBC 9.5 13.3*  HGB 10.8* 11.7*  HCT 33.0* 35.6*  MCV 94.8 93.9  PLT 128* 578   Basic Metabolic Panel Recent Labs    07/15/19 0339 07/15/19 1838 07/16/19 0417 07/16/19 1735 07/17/19 0329  NA 139  --    < > 139 138  K 4.9  --    < > 3.4* 3.5  CL 106  --    < > 98 99  CO2 23  --    < > 24 26  GLUCOSE 99  --    < > 237* 169*  BUN 33*  --    < > 27* 32*  CREATININE 1.29*  --    < > 1.08 1.30*  CALCIUM 7.5*  --    < > 8.1* 8.6*  MG 2.5* 2.4  --   --   --   PHOS 1.5* 3.1  --   --   --    < > = values in this interval not displayed.   Liver Function Tests Recent Labs    07/16/19 0417 07/17/19 0329  AST 124* 107*  ALT 93* 93*  ALKPHOS 108 125  BILITOT 0.8 1.1  PROT 5.6* 6.2*  ALBUMIN 2.1* 2.2*   No results for input(s): LIPASE, AMYLASE in the last 72 hours. Cardiac Enzymes No results for input(s): CKTOTAL, CKMB, CKMBINDEX, TROPONINI in the last 72 hours.  BNP: BNP (last 3 results) Recent Labs    07/11/19 0533  BNP 23.3    ProBNP (last 3 results) No results for input(s): PROBNP in the last 8760 hours.   D-Dimer No results for input(s): DDIMER in the last 72 hours. Hemoglobin A1C No results for input(s): HGBA1C in the last 72 hours. Fasting Lipid Panel Recent Labs    07/15/19 0915  TRIG 208*   Thyroid Function Tests No results for input(s): TSH, T4TOTAL, T3FREE, THYROIDAB in the last 72 hours.  Invalid input(s): FREET3  Other results:   Imaging    No results found.   Medications:     Scheduled Medications: . aspirin  81 mg Oral Daily  . chlorhexidine gluconate (MEDLINE KIT)  15 mL Mouth Rinse BID  . Chlorhexidine Gluconate Cloth  6 each Topical Daily  . digoxin  0.125 mg Per Tube Daily  . furosemide  40 mg Intravenous BID  . heparin  5,000 Units Subcutaneous Q8H  . insulin aspart  0-9 Units Subcutaneous Q4H  . pantoprazole sodium  40 mg Oral Daily  . potassium chloride  20 mEq Oral BID  . sodium  chloride flush  10-40 mL Intracatheter Q12H  . spironolactone  25 mg Oral Daily  . ticagrelor  90 mg Oral BID    Infusions: . sodium chloride 10 mL/hr at 07/17/19 0400  . amiodarone 30 mg/hr (07/17/19 0400)  . ceFEPime (MAXIPIME) IV 2 g (07/17/19 0513)  . levETIRAcetam Stopped (07/17/19 0335)  . milrinone 0.125 mcg/kg/min (07/17/19 0511)  . sodium chloride      PRN Medications: acetaminophen (TYLENOL) oral liquid 160 mg/5 mL, sodium chloride flush    Assessment/Plan   1. Witnessed Out of Hospital VF Arrest in setting of anterior STEMI  - Witnessed by EMS w/ prompt initiation of ACLS - Estimated 40-50 min resucitation time - Secondary to acute anterior STEMI w/ proximal LAD occlusion, s/p PCI on  - EF 15% - s/p hypothermia  - Cognitively intact.   2. Anterior STEMI - LHC showed 99% proximal LAD thrombotic occlusion, s/p PCI + DES placement. No other significant disease - EF 15%.  - Continue DAPT w/ ASA +  Statin. Stop brillinta and load plavix due to addition of eliquis. ( in a flutter)  - no ? blocker w/ low output   3. Acute systolic HF -> Cardiogenic Shock  - initial echo 07/11/19 EF 15%. RV systolic function mildly reduced  - f/u echo on 3/13 LVEF 20-25% -- Stop milrinone. CO-OX 73%.  - Volume status improved. Creatinine trending up. Stop IV lasix . Start po lasix 40 mg twice a day.  - Continue digoxin - Continue spironolactone daily.  4. Acute hypoxic respiratory failure. -In setting of VF arrest. -Self extubated 3/12.  Reintubated 3/12 due to recurrent respiratory failure Extubated 3/15. Stable on 2 liters [place].  - Continue cefipime. - Trach aspirate NGTD  5. Post MI Atrial Fibrillation/A flutter  -Amio stopped 07/16/19 but restarted with a flutter.  - Discussed anticoagulation with Dr Ellyn Hack.  - Start eliquis 5 mg twice a day. See above.   5. Shock Liver:  - 2/2 Acute MI/ cardiogenic shock  - LFTs trending down.  6. AKI:  Creatinine trending up  1.08>1.3  - Stopping IV lasix.    Remove swan. Place PICC line. PT/OT consults today.    Length of Stay: [place], NP  07/17/2019, 8:23 AM  Advanced Heart Failure Team Pager [phone] (M-F; 7a - 4p)  Please  contact [facility] for night-coverage after hours (4p -7a ) and weekends on [email or web address]  Agree   Extubated yesterday. Now off milrinone. Awake and following commands. Interactive. Excellent diuresis overnight. Swan numbers look good.In AFL today on IV amio. Renal function stable  General:sitting up in bed  HEENT: normal Neck: supple. no JVD. Carotids 2+ bilat; no bruits. No lymphadenopathy or thryomegaly appreciated. Cor: PMI nondisplaced. Irregular rate & rhythm. No rubs, gallops or murmurs. Lungs: coarse Abdomen: soft, nontender, nondistended. No hepatosplenomegaly. No bruits or masses. Good bowel sounds. Extremities: no cyanosis, clubbing, rash, edema + RFV swan  Neuro: alert & orientedx3, cranial nerves grossly intact. moves all 4 extremities w/o difficulty. Affect pleasant  Improved today. Off milrinone. Extubated. Remains in AFL. Swan numbers ok. Can pull swan today. Continue IV. Start Eliquis. Switch Brilinta to plavix. Probable TEE/DC-CV prior to d/c if he doesn't convert.   CRITICAL CARE Performed by: Glori Bickers  Total critical care time: 35 minutes  Critical care time was exclusive of separately billable procedures and treating other patients.  Critical care was necessary to treat or prevent imminent or life-threatening deterioration.  Critical care was time spent personally by me (independent of midlevel providers or residents) on the following activities: development of treatment plan with patient and/or surrogate as well as nursing, discussions with consultants, evaluation of patient's response to treatment, examination of patient, obtaining history from patient or surrogate, ordering and performing treatments and interventions, ordering and review  of laboratory studies, ordering and review of radiographic studies, pulse oximetry and re-evaluation of patient's condition.  Glori Bickers, MD  9:08 AM

## 2019-07-17 NOTE — Progress Notes (Signed)
NAME:  Edward Mendoza, MRN:  [id number], DOB:  05/18/1970, LOS: 6 ADMISSION DATE:  07/11/2019, CONSULTATION DATE:   REFERRING MD:  Dr. Scarlette Calico , CHIEF COMPLAINT:  Cardiac arrest  Brief History   49 year old male, 1ppd smoker, admitted 3/10 s/p VF/VT cardaic arrest in setting of anterior STEMI. Underwent 45-50 minutes of CPR before ROSC.  LHC with 99% proximal-LAD occlusion s/p PCI with 3.5 x 18 Onyx DES. Right heart cath demonstrated mildly elevated filling pressures (RA 12, PCW 23) and preserve cardiac output with MVO2 67%.     Past Medical History  Tobacco Abuse  Significant Hospital Events   3/10 Admit post VF/VT arrest. Cath performed with stent placement to LAD  Consults:  PCCM  Procedures:  Swan 3/10 >> ETT 3/10 >> 3/13 (self extubated), 3/14 >> 3/15 PCI 07/11/19  Significant Diagnostic Tests:   CT head  3/10 >> R cerebellar infarct which appears remote or subacute in nature.   CT Head 3/13 >> new wedge-shaped hypodensity involving the parasagittal right occipital lobe, suspicious for an evolving acute ischemic infarct, right PCA territory, additional subtle hypodensity at the subdcortical white matter of the anterior right frontal lobe, chronic right cerebellar infarct   TTE 3/10 >> LVEF ~ 15%, LV has severely decreased function with global hypokinesis, moderate LVH, RV systolic function mildly reduced, trivial MVR  TTE 3/13 >> LVEF ~ 20-25%, LV has severely decreased function, severe hypokinesis of the LV, entire anteroseptal wall, anterior wall and apical segment, moderate hypokinesis of the LV, mid-apical inferior wall, trivial pericardial effusion  Micro Data:  COVID 3/10 >> negative  MRSA PCR 3/10 >> negative Tracheal aspirate 3/13 >>    Antimicrobials:  Cefepime 3/13 >>   Interim history/subjective:   Remains low-grade febrile. Diuresed well with Lasix Extubated and tolerating well Able to speak  Objective   Blood pressure 102/67, pulse (!) 102, temperature  100 F (37.8 C), resp. rate (!) 24, height 6\' 2"  (1.88 m), weight 95.9 kg, SpO2 96 %. PAP: (20-45)/(5-25) 28/14 CVP:  [0 mmHg-13 mmHg] 4 mmHg CO:  [7.2 L/min] 7.2 L/min CI:  [3 L/min/m2] 3 L/min/m2      Intake/Output Summary (Last 24 hours) at 07/17/2019 0836 Last data filed at 07/17/2019 0800 Gross per 24 hour  Intake 1400.51 ml  Output 5770 ml  Net -4369.49 ml   Filed Weights   07/15/19 0500 07/16/19 0600 07/17/19 0500  Weight: 104.6 kg 101.3 kg 95.9 kg    Examination: General: critically ill appearing adult male lying in bed in NAD HEENT: MM pink/moist,  anicteric  Neuro: Appears weak, alert, interactive, nonfocal CV: s1s2 RRR, no m/r/g, JVP ~7-8cm PULM: non-labored , lungs bilaterally clear  GI: soft, bsx4 active  Extremities: warm/dry, generalized 2+ edema.  R Femoral 07/19/19 c/d/i Skin: no rashes or lesions  Chest x-ray 3/16 personally reviewed which shows marked improvement in bilateral infiltrates consistent with edema.  Labs show mild hypokalemia and slight increase in leukocytosis and creatinine  Resolved Hospital Problem list      Assessment & Plan:   VT/ VF Cardiac Arrest 2/2 Anterior STEMI s/p PCI w/ pLAD DES  Cardiogenic Shock  Acute Systolic CHF  AF  Approximate 4/16 minute downtime, reduced LVEF to ~20%.   -appreciate CHF Service assistance with patient care -defer milrinone / amio to CHF team  -continue ASA,brilinta, digoxin, lasix BID, spironolactone   Acute Encephalopathy post Cardiac Arrest, Strokes on CT head -supportive care -aggressive PT/OT -keppra BID per neurology,  since EEG was negative this can perhaps be stopped -dc MRI brain -N.p.o. except meds, advance per swallow eval  Acute Respiratory Failure in setting of Cardiac Arrest Multifocal Infiltrates suspicious for Aspiration vs. HCAP -resolved  -continue cefepime, D4  Consider 5-7 days given low-grade fever -Await final respiratory culture   AKI - in setting of hypotension    -Trend BMP / urinary output -Replace electrolytes as indicated -Avoid nephrotoxic agents, ensure adequate renal perfusion -Hold diuresis since creatinine climbing -Hypokalemia will be repleted  Shock Liver -improving, follow LFT's    Kara Mead MD. FCCP. [facility]  If no response to pager , please call [phone]   07/17/2019, 8:36 AM

## 2019-07-17 NOTE — Progress Notes (Signed)
OT Cancellation Note  Patient Details Name: Edward Mendoza MRN: [id number] DOB: 01-20-1971   Cancelled Treatment:    Reason Eval/Treat Not Completed: Other (comment)(Pt with Theone Murdoch in R groin. OT to see tomorrow.)  OT to eval tomorrow once swan Jasper Riling has been removed for further mobility as pt limited to bed mobility.  Flora Lipps, OTR/L Acute Rehabilitation Services Pager: [phone] Office: [phone]   Lonzo Cloud 07/17/2019, 1:26 PM

## 2019-07-17 NOTE — Progress Notes (Signed)
Peripherally Inserted Central Catheter/Midline Placement  The IV Nurse has discussed with the patient and/or persons authorized to consent for the patient, the purpose of this procedure and the potential benefits and risks involved with this procedure.  The benefits include less needle sticks, lab draws from the catheter, and the patient may be discharged home with the catheter. Risks include, but not limited to, infection, bleeding, blood clot (thrombus formation), and puncture of an artery; nerve damage and irregular heartbeat and possibility to perform a PICC exchange if needed/ordered by physician.  Alternatives to this procedure were also discussed.  Bard Power PICC patient education guide, fact sheet on infection prevention and patient information card has been provided to patient /or left at bedside.  Consent signed by wife per patient preference.  PICC inserted by Annett Fabian, RN  PICC/Midline Placement Documentation  PICC Double Lumen 07/17/19 PICC Right Basilic 45 cm 0 cm (Active)  Indication for Insertion or Continuance of Line Prolonged intravenous therapies 07/17/19 1853  Exposed Catheter (cm) 0 cm 07/17/19 1853  Site Assessment Clean;Dry;Intact 07/17/19 1853  Lumen #1 Status Flushed;Saline locked;Blood return noted 07/17/19 1853  Lumen #2 Status Flushed;Saline locked;Blood return noted 07/17/19 1853  Dressing Type Transparent 07/17/19 1853  Dressing Status Clean;Dry;Intact 07/17/19 1853  Dressing Intervention New dressing 07/17/19 1853  Dressing Change Due 07/24/19 07/17/19 1853       Tinita Brooker, Lajean Manes 07/17/2019, 6:55 PM

## 2019-07-17 NOTE — Evaluation (Signed)
Physical Therapy Evaluation Patient Details Name: Edward Mendoza MRN: [id number] DOB: 20-Oct-1970 Today's Date: 07/17/2019   History of Present Illness  Patient is a 49 y/o male who presents on 3/10 s/p cardiac arrest from Vfib/Vtach, down for 45 minutes. Admitted with STEMI s/p PCI to proximal LAD, self extubated 3/12, brief arrest 3/13 when putting in NG tube, re-intubated 3/13-3/15. Head CT 3/13-new wedge-shaped hypodensity involving the parasagittal right occipital lobe, suspicious for an evolving acute ischemic infarct, right PCA territory. PMH includes smoker.  Clinical Impression  Patient presents with generalized weakness, dizziness, impaired sensation, impaired cognition and impaired mobility s/p above. 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 Lives with his wife and children. Today, PT eval limited to bed due to [employer] in right groin. Noted to have difficulty lifting LEs against gravity and reports decreased sensation LUE. Rolling to left resulted in dizziness. Pt emotionally labile and crying during session. Noted to have cognitive deficits relating to attention, orientation, problem solving, following commands and awareness. Would benefit from CIR to maximize independence and mobility prior to return home. Will follow for OOB assessment next session.    Follow Up Recommendations CIR;Supervision for mobility/OOB;Supervision/Assistance - 24 hour    Equipment Recommendations  Other (comment)(defer)    Recommendations for Other Services       Precautions / Restrictions Precautions Precautions: Fall Precaution Comments: Theone Murdoch in right groin, watch HR Restrictions Weight Bearing Restrictions: No Other Position/Activity Restrictions: keep RLE in extension      Mobility  Bed Mobility Overal bed mobility: Needs Assistance Bed Mobility: Rolling Rolling: Min assist         General bed mobility comments: rolling to left/right with max cues for sequencing/technique,  dizziness reported with rolling left.  Transfers                 General transfer comment: Deferred due to lines in right fem  Ambulation/Gait                Stairs            Wheelchair Mobility    Modified Rankin (Stroke Patients Only) Modified Rankin (Stroke Patients Only) Pre-Morbid Rankin Score: No symptoms Modified Rankin: Moderately severe disability     Balance                                             Pertinent Vitals/Pain Pain Assessment: Faces Faces Pain Scale: Hurts a little bit Pain Location: chest, left fingers Pain Descriptors / Indicators: Sore Pain Intervention(s): Monitored during session;Repositioned    Home Living Family/patient expects to be discharged to:: Private residence Living Arrangements: Spouse/significant other;Children Available Help at Discharge: Family;Available 24 hours/day Type of Home: House Home Access: Stairs to enter Entrance Stairs-Rails: None Entrance Stairs-Number of Steps: 2 Home Layout: Two level;Able to live on main level with bedroom/bathroom Home Equipment: None      Prior Function Level of Independence: Independent         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     Hand Dominance        Extremity/Trunk Assessment   Upper Extremity Assessment Upper Extremity Assessment: Defer to OT evaluation(Able to lift BUEs against gravity, difficulty with eccentric control as pt lets UEs flop down onto bed. Reports decreased sensation LUE compared to RUE)    Lower Extremity Assessment Lower Extremity Assessment: RLE  deficits/detail;LLE deficits/detail RLE Deficits / Details: Ankle AROM WFL, limited testing due to [facility] in right groin RLE: Unable to fully assess due to immobilization LLE Deficits / Details: Some lag and difficulty lifting LLE off bed against gravity. ankle AROM WFL. Good quad set ability. Able to move into abduction/adduction without difficulty.    Cervical /  Trunk Assessment Cervical / Trunk Assessment: Normal  Communication   Communication: No difficulties  Cognition Arousal/Alertness: Awake/alert Behavior During Therapy: WFL for tasks assessed/performed Overall Cognitive Status: Impaired/Different from baseline Area of Impairment: Orientation;Attention;Memory;Following commands;Safety/judgement;Problem solving                 Orientation Level: Disoriented to;Time;Situation Current Attention Level: Sustained Memory: Decreased short-term memory Following Commands: Follows one step commands with increased time;Follows multi-step commands inconsistently Safety/Judgement: Decreased awareness of safety;Decreased awareness of deficits   Problem Solving: Slow processing;Decreased initiation;Difficulty sequencing;Requires verbal cues;Requires tactile cues General Comments: Thinks it is April 2020. Able to get correct date with cues. Does not know what happened. Slow processing and increased time to follow commands with repetition. Emotionally labile and crying during session.      General Comments General comments (skin integrity, edema, etc.): HR up to 125 bpm. BP stable. Wife present during session.    Exercises General Exercises - Upper Extremity Shoulder Flexion: AROM;Both(3 reps) Digit Composite Flexion: AROM;Both;5 reps Composite Extension: AROM;Both;5 reps General Exercises - Lower Extremity Ankle Circles/Pumps: AROM;Both;10 reps;Supine Quad Sets: AROM;Both;10 reps;Supine Heel Slides: Left;Supine;AROM(3 reps) Hip ABduction/ADduction: AROM;Both;Supine(3 reps)   Assessment/Plan    PT Assessment Patient needs continued PT services  PT Problem List Decreased strength;Decreased mobility;Decreased safety awareness;Decreased range of motion;Cardiopulmonary status limiting activity;Pain;Decreased balance;Impaired sensation;Decreased knowledge of use of DME;Decreased activity tolerance;Decreased cognition       PT Treatment  Interventions Therapeutic activities;DME instruction;Gait training;Therapeutic exercise;Patient/family education;Cognitive remediation;Balance training;Neuromuscular re-education;Functional mobility training;Stair training    PT Goals (Current goals can be found in the Care Plan section)  Acute Rehab PT Goals Patient Stated Goal: to get out of this bed and get back to PLOF PT Goal Formulation: With patient Time For Goal Achievement: 07/31/19 Potential to Achieve Goals: Good    Frequency Min 4X/week   Barriers to discharge Inaccessible home environment stairs    Co-evaluation               AM-PAC PT "6 Clicks" Mobility  Outcome Measure Help needed turning from your back to your side while in a flat bed without using bedrails?: A Little Help needed moving from lying on your back to sitting on the side of a flat bed without using bedrails?: A Little Help needed moving to and from a bed to a chair (including a wheelchair)?: A Lot Help needed standing up from a chair using your arms (e.g., wheelchair or bedside chair)?: A Lot Help needed to walk in hospital room?: A Lot Help needed climbing 3-5 steps with a railing? : Total 6 Click Score: 13    End of Session Equipment Utilized During Treatment: Oxygen Activity Tolerance: Patient tolerated treatment well Patient left: in bed;with call bell/phone within reach;with family/visitor present Nurse Communication: Mobility status PT Visit Diagnosis: Pain Pain - Right/Left: Left Pain - part of body: Hand(chest,)    Time: 6384-5364 PT Time Calculation (min) (ACUTE ONLY): 16 min   Charges:   PT Evaluation $PT Eval Moderate Complexity: 1 Mod          Vale Haven, PT, DPT Acute Rehabilitation Services Pager [phone] Office [phone]  Marguarite Arbour A Tahiry Spicer 07/17/2019, 12:54 PM

## 2019-07-17 NOTE — Progress Notes (Signed)
Rehab Admissions Coordinator Note:  Patient was screened by Clois Dupes for appropriateness for an Inpatient Acute Rehab Consult per therapy recs. Eval limited due to [facility] in groin. I will follow up with his evals tomorrow.    Clois Dupes RN MSN 07/17/2019, 1:20 PM  I can be reached at [phone].

## 2019-07-17 NOTE — Progress Notes (Signed)
eLink Physician-Brief Progress Note Patient Name: Edward Mendoza DOB: 06/02/1970 MRN: [id number]   Date of Service  07/17/2019  HPI/Events of Note  K+ 3.5, patient is on scheduled K+ replacement.  eICU Interventions  KCL 40 meq po x 1 now        [employer] 07/17/2019, 5:21 AM

## 2019-07-18 ENCOUNTER — Encounter (HOSPITAL_COMMUNITY): Payer: Self-pay | Admitting: *Deleted

## 2019-07-18 ENCOUNTER — Inpatient Hospital Stay (HOSPITAL_COMMUNITY): Payer: Managed Care, Other (non HMO)

## 2019-07-18 DIAGNOSIS — I480 Paroxysmal atrial fibrillation: Secondary | ICD-10-CM

## 2019-07-18 HISTORY — DX: Paroxysmal atrial fibrillation: I48.0

## 2019-07-18 LAB — COMPREHENSIVE METABOLIC PANEL
ALT: 92 U/L — ABNORMAL HIGH (ref 0–44)
AST: 86 U/L — ABNORMAL HIGH (ref 15–41)
Albumin: 2.2 g/dL — ABNORMAL LOW (ref 3.5–5.0)
Alkaline Phosphatase: 125 U/L (ref 38–126)
Anion gap: 17 — ABNORMAL HIGH (ref 5–15)
BUN: 38 mg/dL — ABNORMAL HIGH (ref 6–20)
CO2: 24 mmol/L (ref 22–32)
Calcium: 8.6 mg/dL — ABNORMAL LOW (ref 8.9–10.3)
Chloride: 95 mmol/L — ABNORMAL LOW (ref 98–111)
Creatinine, Ser: 1.38 mg/dL — ABNORMAL HIGH (ref 0.61–1.24)
GFR calc Af Amer: >60 mL/min (ref >60)
GFR calc non Af Amer: >60 mL/min (ref >60)
Glucose, Bld: 202 mg/dL — ABNORMAL HIGH (ref 70–99)
Potassium: 3.5 mmol/L (ref 3.5–5.1)
Sodium: 136 mmol/L (ref 135–145)
Total Bilirubin: 1 mg/dL (ref 0.3–1.2)
Total Protein: 6.3 g/dL — ABNORMAL LOW (ref 6.5–8.1)

## 2019-07-18 LAB — CBC
HCT: 37.1 % — ABNORMAL LOW (ref 39.0–52.0)
Hemoglobin: 12.2 g/dL — ABNORMAL LOW (ref 13.0–17.0)
MCH: 30.3 pg (ref 26.0–34.0)
MCHC: 32.9 g/dL (ref 30.0–36.0)
MCV: 92.3 fL (ref 80.0–100.0)
Platelets: 206 10*3/uL (ref 150–400)
RBC: 4.02 MIL/uL — ABNORMAL LOW (ref 4.22–5.81)
RDW: 12.7 % (ref 11.5–15.5)
WBC: 12.2 10*3/uL — ABNORMAL HIGH (ref 4.0–10.5)
nRBC: 0 % (ref 0.0–0.2)

## 2019-07-18 LAB — GLUCOSE, CAPILLARY
Glucose-Capillary: 107 mg/dL — ABNORMAL HIGH (ref 70–99)
Glucose-Capillary: 105 mg/dL — ABNORMAL HIGH (ref 70–99)
Glucose-Capillary: 125 mg/dL — ABNORMAL HIGH (ref 70–99)

## 2019-07-18 LAB — DIGOXIN LEVEL: Digoxin Level: <0.2 ng/mL — ABNORMAL LOW (ref 0.8–2.0)

## 2019-07-18 MED ORDER — CHLORHEXIDINE GLUCONATE 0.12 % MT SOLN
OROMUCOSAL | Status: AC
  Administered 2019-07-18: 15 mL via OROMUCOSAL
  Filled 2019-07-18: qty 30

## 2019-07-18 MED ORDER — ADULT MULTIVITAMIN W/MINERALS CH
1.0000 | ORAL_TABLET | Freq: Every day | ORAL | Status: DC
  Administered 2019-07-18 – 2019-07-22 (×5): 1 via ORAL
  Filled 2019-07-18 (×5): qty 1

## 2019-07-18 MED ORDER — AMIODARONE LOAD VIA INFUSION
150.0000 mg | Freq: Once | INTRAVENOUS | Status: AC
  Administered 2019-07-18: 150 mg via INTRAVENOUS
  Filled 2019-07-18: qty 83.34

## 2019-07-18 MED ORDER — ENSURE ENLIVE PO LIQD
237.0000 mL | Freq: Three times a day (TID) | ORAL | Status: DC
  Administered 2019-07-18 – 2019-07-21 (×8): 237 mL via ORAL

## 2019-07-18 MED ORDER — ATROPINE SULFATE 1 MG/10ML IJ SOSY
PREFILLED_SYRINGE | INTRAMUSCULAR | Status: AC
  Filled 2019-07-18: qty 10

## 2019-07-18 NOTE — Discharge Instructions (Signed)

## 2019-07-18 NOTE — Progress Notes (Signed)
Pt arrived from 2 H. Patient and wife oriented to unit, vss, CHG bath, CCMD called and verified by two. Patient has right upper arm picc dressing dated 07/17/19. Amiodarone gtt running and patient currently in atrial flutter. CCMD aware to call if patient converts. Orders released and verified. Patient ate dinner prior to arrival. Explained to wife new visitation policy. Wife states husband has been promised rest all day and has not been able to have a nap. Wife went to get food while husband resting. Pt resting with call bell within reach.  Will continue to monitor.

## 2019-07-18 NOTE — Progress Notes (Signed)
Nutrition Follow-up  DOCUMENTATION CODES:   Not applicable  INTERVENTION:    Ensure Enlive po TID, each supplement provides 350 kcal and 20 grams of protein  MVI daily   NUTRITION DIAGNOSIS:   Increased nutrient needs related to acute illness as evidenced by estimated needs.  Ongoing  GOAL:   Patient will meet greater than or equal to 90% of their needs  Progressing  MONITOR:   PO intake, Supplement acceptance, Weight trends, Labs, I & O's  REASON FOR ASSESSMENT:   Consult Enteral/tube feeding initiation and management  ASSESSMENT:   49 yo male admitted post VT/VF cardiac arrest secondary to anterior STEMI, acute encephalopathy and acute respiratory failure post arrest requiring intubation. No PMH  3/10- s/p cath with stent placemen 3/12- self extubated  3/13- intubated  3/15- extubated   Diet advanced to DYS 3 with thin liquids yesterday. RD observed breakfast tray with a few bites taken. Pt reports he feels scared to eat because of his sore throat. Encouraged pt to take meals slow and take sips of liquids between bites. Discussed the importance of protein intake to promote post op healing. Pt willing to try Ensure.  Admission weight: 105.2 kg  Current weight: 96.4 kg   I/O: -1,122 ml since admit  UOP: 3,525 ml x 24 hrs   Medications: SS novolog, 20 mEq KCl BID, aldactone Labs: CBG 98-202  Diet Order:   Diet Order            DIET DYS 3 Room service appropriate? Yes; Fluid consistency: Thin  Diet effective now              EDUCATION NEEDS:   Education needs have been addressed  Skin:  Skin Assessment: Reviewed RN Assessment  Last BM:  3/17  Height:   Ht Readings from Last 1 Encounters:  07/18/19 6\' 1"  (1.854 m)    Weight:   Wt Readings from Last 1 Encounters:  07/18/19 96.4 kg    Ideal Body Weight:  86.4 kg  BMI:  Body mass index is 28.04 kg/m.  Estimated Nutritional Needs:   Kcal:  2200-2400 kcal  Protein:  110-125  grams  Fluid:  >/= 2 L/day   07/20/19 RD, LDN Clinical Nutrition Pager listed in AMION

## 2019-07-18 NOTE — Consult Note (Signed)
Physical Medicine and Rehabilitation Consult Reason for Consult: Decreased functional mobility Referring Physician: Dr. [name]   HPI: Edward Mendoza is a 49 y.o. right-handed male with history of tobacco abuse.  Per chart review patient lives with spouse.  Independent prior to admission.  Works as a Arboriculturist.  Two-level home with bed and bath on main level.  Presented 07/11/2019 with cardiac arrest from V. Fib/V. tach that was down for approximate 45 minutes.  Status post PCI to proximal LAD.  Hospital course patient self extubated 07/13/2019.  Brief arrest 313 1 putting an NG tube requiring reintubation through 07/16/2019.  Cranial CT scan 07/14/2019 showed new wedge-shaped hypodensity involving the parasagittal right occipital lobe suspicious for evolving acute ischemic infarction, right PCA territory.  Patient is currently maintained on aspirin, Eliquis as well as Plavix.  Maxipime was initiated for question HCAP.  Therapy evaluations completed with recommendations of physical medicine rehab consult.   Review of Systems  Constitutional: Negative for chills and fever.  HENT: Negative for hearing loss.   Eyes: Negative for blurred vision and double vision.  Respiratory: Negative for cough and shortness of breath.   Cardiovascular: Positive for palpitations. Negative for chest pain and leg swelling.  Gastrointestinal: Positive for constipation. Negative for heartburn and vomiting.  Genitourinary: Negative for dysuria, flank pain and hematuria.  Musculoskeletal: Positive for myalgias.  Skin: Negative for rash.  All other systems reviewed and are negative.  Past Medical History:  Diagnosis Date  . Smoker 07/13/2019   Past Surgical History:  Procedure Laterality Date  . ARTERIAL LINE INSERTION N/A 07/11/2019   Procedure: ARTERIAL LINE INSERTION;  Surgeon: Marykay Lex, MD;  Location: [facility] INVASIVE CV LAB;  Service: Cardiovascular;  Laterality: N/A;  . CORONARY STENT INTERVENTION N/A  07/11/2019   Procedure: CORONARY STENT INTERVENTION;  Surgeon: Marykay Lex, MD;  Location: [facility] INVASIVE CV LAB;  Service: Cardiovascular;  Laterality: N/A;  . CORONARY/GRAFT ACUTE MI REVASCULARIZATION N/A 07/11/2019   Procedure: Coronary/Graft Acute MI Revascularization;  Surgeon: Marykay Lex, MD;  Location: [facility] INVASIVE CV LAB;  Service: Cardiovascular;  Laterality: N/A;  . RIGHT/LEFT HEART CATH AND CORONARY ANGIOGRAPHY N/A 07/11/2019   Procedure: RIGHT/LEFT HEART CATH AND CORONARY ANGIOGRAPHY;  Surgeon: Marykay Lex, MD;  Location: [facility] INVASIVE CV LAB;  Service: Cardiovascular;  Laterality: N/A;   History reviewed. No pertinent family history. Social History:  reports that he has been smoking cigarettes. He started smoking about 21 years ago. He has a 20.00 pack-year smoking history. He has never used smokeless tobacco. He reports previous alcohol use. He reports previous drug use. Allergies: Not on File No medications prior to admission.    Home: Home Living Family/patient expects to be discharged to:: Private residence Living Arrangements: Spouse/significant other, Children Available Help at Discharge: Family, Available 24 hours/day Type of Home: House Home Access: Stairs to enter [employer] of Steps: 2 Entrance Stairs-Rails: None Home Layout: Two level, Able to live on main level with bedroom/bathroom Alternate Level Stairs-Number of Steps: 1 flight Bathroom Shower/Tub: Engineer, manufacturing systems: Standard Home Equipment: None  Functional History: Prior Function Level of Independence: Independent 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 Functional Status:  Mobility: Bed Mobility Overal bed mobility: Needs Assistance Bed Mobility: Rolling Rolling: Min assist General bed mobility comments: rolling to left/right with max cues for sequencing/technique, dizziness reported with rolling left. Transfers General transfer comment: Deferred due to lines in right  fem      ADL:  Cognition: Cognition Overall Cognitive Status: Impaired/Different from baseline Orientation Level: Oriented X4 Cognition Arousal/Alertness: Awake/alert Behavior During Therapy: WFL for tasks assessed/performed Overall Cognitive Status: Impaired/Different from baseline Area of Impairment: Orientation, Attention, Memory, Following commands, Safety/judgement, Problem solving Orientation Level: Disoriented to, Time, Situation Current Attention Level: Sustained Memory: Decreased short-term memory Following Commands: Follows one step commands with increased time, Follows multi-step commands inconsistently Safety/Judgement: Decreased awareness of safety, Decreased awareness of deficits Problem Solving: Slow processing, Decreased initiation, Difficulty sequencing, Requires verbal cues, Requires tactile cues General Comments: Thinks it is April 2020. Able to get correct date with cues. Does not know what happened. Slow processing and increased time to follow commands with repetition. Emotionally labile and crying during session.  Blood pressure 97/69, pulse 97, temperature 98.7 F (37.1 C), temperature source Oral, resp. rate (!) 21, height 6\' 1"  (1.854 m), weight 96.4 kg, SpO2 93 %.  General: Alert and oriented x 3, No apparent distress. Fatigued.  HEENT: Head is normocephalic, atraumatic, PERRLA, EOMI, sclera anicteric, oral mucosa pink and moist, dentition intact, ext ear canals clear,  Neck: Supple without JVD or lymphadenopathy Heart: Reg rate and rhythm. No murmurs rubs or gallops Chest: CTA bilaterally without wheezes, rales, or rhonchi; no distress Abdomen: Soft, non-tender, non-distended, bowel sounds positive. Extremities: No clubbing, cyanosis, or edema. Pulses are 2+ Skin: Clean and intact without signs of breakdown Neurological: Patient is alert and oriented x3.  Follows simple commands. 5/5 strength in bilateral upper and lower extremities.  Musculoskeletal:  Full ROM, No pain with AROM or PROM in the neck, trunk, or extremities. Posture appropriate Psych: Pt's affect is appropriate. Pt is cooperative  Results for orders placed or performed during the hospital encounter of 07/11/19 (from the past 24 hour(s))  Glucose, capillary     Status: None   Collection Time: 07/17/19  3:26 PM  Result Value Ref Range   Glucose-Capillary 98 70 - 99 mg/dL  Glucose, capillary     Status: Abnormal   Collection Time: 07/17/19 10:08 PM  Result Value Ref Range   Glucose-Capillary 106 (H) 70 - 99 mg/dL  Comprehensive metabolic panel     Status: Abnormal   Collection Time: 07/18/19  4:27 AM  Result Value Ref Range   Sodium 136 135 - 145 mmol/L   Potassium 3.5 3.5 - 5.1 mmol/L   Chloride 95 (L) 98 - 111 mmol/L   CO2 24 22 - 32 mmol/L   Glucose, Bld 202 (H) 70 - 99 mg/dL   BUN 38 (H) 6 - 20 mg/dL   Creatinine, Ser 07/20/19 (H) 0.61 - 1.24 mg/dL   Calcium 8.6 (L) 8.9 - 10.3 mg/dL   Total Protein 6.3 (L) 6.5 - 8.1 g/dL   Albumin 2.2 (L) 3.5 - 5.0 g/dL   AST 86 (H) 15 - 41 U/L   ALT 92 (H) 0 - 44 U/L   Alkaline Phosphatase 125 38 - 126 U/L   Total Bilirubin 1.0 0.3 - 1.2 mg/dL   GFR calc non Af Amer >60 >60 mL/min   GFR calc Af Amer >60 >60 mL/min   Anion gap 17 (H) 5 - 15  CBC     Status: Abnormal   Collection Time: 07/18/19  4:27 AM  Result Value Ref Range   WBC 12.2 (H) 4.0 - 10.5 K/uL   RBC 4.02 (L) 4.22 - 5.81 MIL/uL   Hemoglobin 12.2 (L) 13.0 - 17.0 g/dL   HCT 07/20/19 (L) 47.6 - 54.6 %   MCV 92.3 80.0 -  100.0 fL   MCH 30.3 26.0 - 34.0 pg   MCHC 32.9 30.0 - 36.0 g/dL   RDW 12.7 11.5 - 15.5 %   Platelets 206 150 - 400 K/uL   nRBC 0.0 0.0 - 0.2 %  Glucose, capillary     Status: Abnormal   Collection Time: 07/18/19  6:53 AM  Result Value Ref Range   Glucose-Capillary 107 (H) 70 - 99 mg/dL  Digoxin level     Status: Abnormal   Collection Time: 07/18/19  8:00 AM  Result Value Ref Range   Digoxin Level <0.2 (L) 0.8 - 2.0 ng/mL  Glucose, capillary      Status: Abnormal   Collection Time: 07/18/19 12:40 PM  Result Value Ref Range   Glucose-Capillary 105 (H) 70 - 99 mg/dL   MR BRAIN WO CONTRAST  Result Date: 07/18/2019 CLINICAL DATA:  Cardiac arrest 1 week ago. Resuscitation. Encephalopathy. EXAM: MRI HEAD WITHOUT CONTRAST TECHNIQUE: Multiplanar, multiecho pulse sequences of the brain and surrounding structures were obtained without intravenous contrast. COMPARISON:  Head CT 07/14/2018 FINDINGS: Brain: There is an old inferior cerebellar infarction on the right. There are innumerable punctate acute infarctions throughout both cerebellar hemispheres and both cerebral hemispheres consistent with micro embolic infarctions from the heart or ascending aorta. Largest area of infarction is about 1.2 cm in size at the right medial parietooccipital junction. The study does not show atypical diffuse hypoxic ischemic pattern. Deep brain structures appear intact. No evidence of mass, hemorrhage, hydrocephalus or extra-axial collection. Vascular: Major vessels at the base of the brain show flow. Skull and upper cervical spine: Negative Sinuses/Orbits: Sphenoid sinusitis. Possible fungus ball in the right division of the sphenoid sinus. Other: None IMPRESSION: Innumerable punctate acute infarctions scattered throughout the cerebellum and both cerebral hemispheres most consistent with micro embolic infarctions from the heart or ascending aorta. The study does not show a typical pattern of diffuse hypoxic ischemic injury. 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 Sphenoid sinusitis. 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 This appearance could also be due to chronic inspissated mucus. Electronically Signed   By: Nelson Chimes M.D.   On: 07/18/2019 12:29   DG CHEST PORT 1 VIEW  Result Date: 07/17/2019 CLINICAL DATA:  PICC line placement EXAM: PORTABLE CHEST 1 VIEW COMPARISON:  07/17/2019, 07/15/2019 FINDINGS: Placement of right upper extremity central venous catheter  with tip faintly visible at the cavoatrial region. Ascending pulmonary arterial catheter tip overlies the left pulmonary artery. Low lung volumes. Stable cardiomediastinal silhouette with decreased vascular congestion. Patchy airspace disease at the left base without change IMPRESSION: 1. Right upper extremity central venous catheter tip projects over the cavoatrial region 2. Cardiomegaly with decreased vascular congestion 3. Patchy atelectasis or infiltrate at the left base Electronically Signed   By: Donavan Foil M.D.   On: 07/17/2019 19:33   DG CHEST PORT 1 VIEW  Result Date: 07/17/2019 CLINICAL DATA:  Respiratory failure EXAM: PORTABLE CHEST 1 VIEW COMPARISON:  07/15/2019 FINDINGS: Interval endotracheal extubation. Inferior approach pulmonary vascular catheter remains with tip directed into the left pulmonary artery. Improved bilateral heterogeneous airspace opacity. IMPRESSION: 1.  Interval endotracheal extubation. 2. Fall improved bilateral heterogeneous airspace opacity, consistent with improved infection or edema. Electronically Signed   By: Eddie Candle M.D.   On: 07/17/2019 09:19   [place] EKG SITE RITE  Result Date: 07/17/2019 If Site Rite image not attached, placement could not be confirmed due to current cardiac rhythm.    Assessment/Plan: Diagnosis: Cardiac debility 1. Does the need  for close, 24 hr/day medical supervision in concert with the patient's rehab needs make it unreasonable for this patient to be served in a less intensive setting? Yes 2. Co-Morbidities requiring supervision/potential complications: VF arrest, anterior STEMI, acute systolic heart failure, cardiogenic shock, acute hypoxic respiratory failure, post MI atrial fibrillation, aflutter, shock liver, AKI 3. Due to bladder management, bowel management, safety, skin/wound care, disease management, medication administration, pain management and patient education, does the patient require 24 hr/day rehab nursing?  Yes 4. Does the patient require coordinated care of a physician, rehab nurse, therapy disciplines of PT, OT to address physical and functional deficits in the context of the above medical diagnosis(es)? Yes Addressing deficits in the following areas: balance, endurance, locomotion, strength, transferring, bowel/bladder control, bathing, dressing, feeding, grooming, toileting and psychosocial support 5. Can the patient actively participate in an intensive therapy program of at least 3 hrs of therapy per day at least 5 days per week? Yes 6. The potential for patient to make measurable gains while on inpatient rehab is excellent 7. Anticipated functional outcomes upon discharge from inpatient rehab are modified independent  with PT, modified independent with OT, independent with SLP. 8. Estimated rehab length of stay to reach the above functional goals is: 5-7 days 9. Anticipated discharge destination: Home 10. Overall Rehab/Functional Prognosis: excellent  RECOMMENDATIONS: This patient's condition is appropriate for continued rehabilitative care in the following setting: CIR Patient has agreed to participate in recommended program. Yes Note that insurance prior authorization may be required for reimbursement for recommended care.  Comment: Mr. Dahms would be an excellent CIR candidate once he no longer requiring amiodarone drip. I discussed benefits of inpatient rehabilitation with patient and his wife at bedside. He had dizziness and unsteadiness of gait when ambulating to bedside commode and would be difficult for wife to care for him at home at this time. After a short stay of rehabilitation his independence and safety at home will be maximized. Thank you for this consult.   Mcarthur Rossetti Angiulli, PA-C 07/18/2019   I have personally performed a face to face diagnostic evaluation, including, but not limited to relevant history and physical exam findings, of this patient and developed relevant  assessment and plan.  Additionally, I have reviewed and concur with the physician assistant's documentation above.  Sula Soda, MD

## 2019-07-18 NOTE — Progress Notes (Addendum)
Advanced Heart Failure Rounding Note  PCP-Cardiologist: No primary care provider on file.   Subjective:    Events:  3/10 admitted with anterior STEMI and VF arrest with prolonged resuscitation s/p PCI LAD 3/12 self extubated. Developed recurrent respiratory failure and reintubated 3/15 Extubated   On amio drip for A flutter.   Denies SOB. Want to get out of bed.    Objective:   Weight Range: 96.4 kg Body mass index is 28.04 kg/m.   Vital Signs:   Temp:  [99.1 F (37.3 C)-100.2 F (37.9 C)] 99.1 F (37.3 C) (03/17 0655) Pulse Rate:  [96-106] 101 (03/17 0600) Resp:  [17-32] 24 (03/17 0600) BP: (86-103)/(62-76) 92/68 (03/17 0600) SpO2:  [91 %-98 %] 94 % (03/17 0600) Weight:  [96.4 kg] 96.4 kg (03/17 0600) Last BM Date: 07/17/19  Weight change: Filed Weights   07/16/19 0600 07/17/19 0500 07/18/19 0600  Weight: 101.3 kg 95.9 kg 96.4 kg    Intake/Output:   Intake/Output Summary (Last 24 hours) at 07/18/2019 0814 Last data filed at 07/18/2019 0616 Gross per 24 hour  Intake 1040.3 ml  Output 2775 ml  Net -1734.7 ml      Physical Exam  CVP 2 General:   No resp difficulty HEENT: normal Neck: supple. no JVD. Carotids 2+ bilat; no bruits. No lymphadenopathy or thryomegaly appreciated. Cor: PMI nondisplaced. Regular rate & rhythm. No rubs, gallops or murmurs. Lungs: clear Abdomen: soft, nontender, nondistended. No hepatosplenomegaly. No bruits or masses. Good bowel sounds. Extremities: no cyanosis, clubbing, rash, edema. RUE PICC Neuro: alert & orientedx3, cranial nerves grossly intact. moves all 4 extremities w/o difficulty. Affect pleasant   Telemetry  A Flutter 90-100s    Labs    CBC Recent Labs    07/17/19 0329 07/18/19 0427  WBC 13.3* 12.2*  HGB 11.7* 12.2*  HCT 35.6* 37.1*  MCV 93.9 92.3  PLT 158 563   Basic Metabolic Panel Recent Labs    07/15/19 1838 07/16/19 0417 07/17/19 1243 07/18/19 0427  NA  --    < > 137 136  K  --    < >  3.8 3.5  CL  --    < > 99 95*  CO2  --    < > 26 24  GLUCOSE  --    < > 163* 202*  BUN  --    < > 35* 38*  CREATININE  --    < > 1.34* 1.38*  CALCIUM  --    < > 8.8* 8.6*  MG 2.4  --   --   --   PHOS 3.1  --   --   --    < > = values in this interval not displayed.   Liver Function Tests Recent Labs    07/17/19 0329 07/18/19 0427  AST 107* 86*  ALT 93* 92*  ALKPHOS 125 125  BILITOT 1.1 1.0  PROT 6.2* 6.3*  ALBUMIN 2.2* 2.2*   No results for input(s): LIPASE, AMYLASE in the last 72 hours. Cardiac Enzymes No results for input(s): CKTOTAL, CKMB, CKMBINDEX, TROPONINI in the last 72 hours.  BNP: BNP (last 3 results) Recent Labs    07/11/19 0533  BNP 23.3    ProBNP (last 3 results) No results for input(s): PROBNP in the last 8760 hours.   D-Dimer No results for input(s): DDIMER in the last 72 hours. Hemoglobin A1C No results for input(s): HGBA1C in the last 72 hours. Fasting Lipid Panel Recent Labs  07/15/19 0915  TRIG 208*   Thyroid Function Tests No results for input(s): TSH, T4TOTAL, T3FREE, THYROIDAB in the last 72 hours.  Invalid input(s): FREET3  Other results:   Imaging    DG CHEST PORT 1 VIEW  Result Date: 07/17/2019 CLINICAL DATA:  PICC line placement EXAM: PORTABLE CHEST 1 VIEW COMPARISON:  07/17/2019, 07/15/2019 FINDINGS: Placement of right upper extremity central venous catheter with tip faintly visible at the cavoatrial region. Ascending pulmonary arterial catheter tip overlies the left pulmonary artery. Low lung volumes. Stable cardiomediastinal silhouette with decreased vascular congestion. Patchy airspace disease at the left base without change IMPRESSION: 1. Right upper extremity central venous catheter tip projects over the cavoatrial region 2. Cardiomegaly with decreased vascular congestion 3. Patchy atelectasis or infiltrate at the left base Electronically Signed   By: Donavan Foil M.D.   On: 07/17/2019 19:33   [place] EKG SITE  RITE  Result Date: 07/17/2019 If Site Rite image not attached, placement could not be confirmed due to current cardiac rhythm.    Medications:     Scheduled Medications: . apixaban  5 mg Oral BID  . aspirin  81 mg Oral Daily  . chlorhexidine gluconate (MEDLINE KIT)  15 mL Mouth Rinse BID  . Chlorhexidine Gluconate Cloth  6 each Topical Daily  . clopidogrel  75 mg Oral Daily  . digoxin  0.125 mg Per Tube Daily  . furosemide  40 mg Oral BID  . insulin aspart  0-9 Units Subcutaneous Q4H  . levETIRAcetam  500 mg Oral BID  . pantoprazole  40 mg Oral Daily  . potassium chloride  20 mEq Oral BID  . sodium chloride flush  10-40 mL Intracatheter Q12H  . sodium chloride flush  10-40 mL Intracatheter Q12H  . spironolactone  25 mg Oral Daily    Infusions: . sodium chloride 10 mL/hr at 07/18/19 0600  . amiodarone 30 mg/hr (07/18/19 0600)  . ceFEPime (MAXIPIME) IV 2 g (07/18/19 0616)  . sodium chloride      PRN Medications: acetaminophen (TYLENOL) oral liquid 160 mg/5 mL, sodium chloride flush, sodium chloride flush    Assessment/Plan   1. Witnessed Out of Hospital VF Arrest in setting of anterior STEMI  - Witnessed by EMS w/ prompt initiation of ACLS - Estimated 40-50 min resucitation time - Secondary to acute anterior STEMI w/ proximal LAD occlusion, s/p PCI on  - EF 15% - s/p hypothermia  - Cognitively intact.   2. Anterior STEMI - LHC showed 99% proximal LAD thrombotic occlusion, s/p PCI + DES placement. No other significant disease - EF 15%.  - Continue DAPT w/ ASA +  Statin + plavix due to addition of eliquis.  ( in a flutter) - no ? blocker w/ low output   3. Acute systolic HF -> Cardiogenic Shock  - initial echo 07/11/19 EF 15%. RV systolic function mildly reduced  - f/u echo on 3/13 LVEF 20-25% -CO-OX pending. CVP 1-2. Hold lasix today.  - Continue digoxin - Continue spironolactone daily. - Renal function stable.   4. Acute hypoxic respiratory failure. -In  setting of VF arrest. -Self extubated 3/12.  Reintubated 3/12 due to recurrent respiratory failure Extubated 3/15. Sats stable on room air.   - Continue cefipime. - Trach aspirate NGTD  5. Post MI Atrial Fibrillation/A flutter  -Amio stopped 07/16/19 but restarted with a flutter.  - Continue amio drip.  - Continue  eliquis 5 mg twice a day. See above.   5. Shock Liver:  -  2/2 Acute MI/ cardiogenic shock  - LFTs trending down.  6. AKI:  Creatinine trending up 1.08>1.3 >1.4    PT/OT consulted.    Length of Stay: 7  Amy Clegg, NP  07/18/2019, 8:14 AM  Advanced Heart Failure Team Pager [phone] (M-F; 7a - 4p)  Please contact [facility] for night-coverage after hours (4p -7a ) and weekends on [email or web address]  Patient seen and examined with the above-signed Advanced Practice Provider and/or Housestaff. I personally reviewed laboratory data, imaging studies and relevant notes. I independently examined the patient and formulated the important aspects of the plan. I have edited the note to reflect any of my changes or salient points. I have personally discussed the plan with the patient and/or family.  Feeling better. Denies CP or SOB. Remains in AFL on IV amio. Volume status ok. Creatinine stable 1.3. K 3.5. mental status improved but I am not quite sure he is back to baseline.   General:  Sitting up in bed  No resp difficulty HEENT: normal Neck: supple. no JVD. Carotids 2+ bilat; no bruits. No lymphadenopathy or thryomegaly appreciated. Cor: PMI nondisplaced. Irregular. No rubs, gallops or murmurs. Lungs: clear Abdomen: soft, nontender, nondistended. No hepatosplenomegaly. No bruits or masses. Good bowel sounds. Extremities: no cyanosis, clubbing, rash, edema Neuro: alert & orientedx3, cranial nerves grossly intact. moves all 4 extremities w/o difficulty. Affect pleasant  Improving slowly. Volume status looks good. Remains in AFl. No on Eliquis. May need TEE/DCCV prior to d/c.  Titrate HF meds. BP too low to add ARB today but hopefully soon.  Can go to SDU.   Glori Bickers, MD  1:46 PM

## 2019-07-18 NOTE — Progress Notes (Signed)
Physical Therapy Treatment Patient Details Name: Edward Mendoza MRN: [id number] DOB: [birth year] Today's Date: 07/18/2019    History of Present Illness Patient is a 49 y/o male who presents on 3/10 s/p cardiac arrest from Vfib/Vtach, down for 45 minutes. Admitted with STEMI s/p PCI to proximal LAD, self extubated 3/12, brief arrest 3/13 when putting in NG tube, re-intubated 3/13-3/15. Head CT 3/13-new wedge-shaped hypodensity involving the parasagittal right occipital lobe, suspicious for an evolving acute ischemic infarct, right PCA territory. PMH includes smoker.    PT Comments    Pt with Edward Mendoza removed and able to progress to OOB activity with PT.  Pt with decreased safety awareness and follows commands inconsistently.  He required min A for bed mobility and mod A for OOB activity due to unsteadiness.  Pt able to take a few steps to [facility] but very unsteady and had low BP that limited further activity.  Sat on [facility] with supervision for safety for about 8 minutes prior to returning to bed.  Pt had been up earlier with OT and again with nursing and was fatigued.  Pt progressing well and with good rehab potential.    Follow Up Recommendations  CIR;Supervision for mobility/OOB;Supervision/Assistance - 24 hour     Equipment Recommendations  Other (comment)(defer)    Recommendations for Other Services       Precautions / Restrictions Precautions Precautions: Fall Restrictions Weight Bearing Restrictions: No    Mobility  Bed Mobility Overal bed mobility: Needs Assistance Bed Mobility: Rolling;Supine to Sit;Sit to Supine Rolling: Min assist   Supine to sit: HOB elevated;Min assist Sit to supine: Mod assist   General bed mobility comments: Cue for transfer technique and use of bed rail; min A with log roll technique to elevate trunk  Transfers Overall transfer level: Needs assistance Equipment used: 1 person hand held assist Transfers: Sit to/from [employer] Sit to  Stand: Min assist Stand pivot transfers: Mod assist       General transfer comment: cues for safe hand placement; performed sit to stand x 2; mod A with stand pivot for steadying  Ambulation/Gait Ambulation/Gait assistance: Mod assist Gait Distance (Feet): 2 Feet(x2) Assistive device: 1 person hand held assist Gait Pattern/deviations: Step-to pattern;Decreased stride length;Staggering left;Staggering right     General Gait Details: steps to bsc only; fatigues easily; very unsteady; limited further ambulation due to fatigue and low bp   Stairs             Wheelchair Mobility    Modified Rankin (Stroke Patients Only) Modified Rankin (Stroke Patients Only) Pre-Morbid Rankin Score: No symptoms Modified Rankin: Moderately severe disability     Balance Overall balance assessment: Needs assistance Sitting-balance support: Bilateral upper extremity supported;Feet supported Sitting balance-Leahy Scale: Fair Sitting balance - Comments: pt minguardA overall as pt "testing his limits" swaying side to side and forward and backward.   Standing balance support: Single extremity supported;During functional activity Standing balance-Leahy Scale: Poor Standing balance comment: Requried mod A at time for balance during ADLs; RN into assist with ADLs for safety                            Cognition Arousal/Alertness: Awake/alert Behavior During Therapy: WFL for tasks assessed/performed Overall Cognitive Status: Impaired/Different from baseline Area of Impairment: Orientation;Safety/judgement;Awareness;Problem solving                 Orientation Level: Disoriented to;Time Current Attention Level: Sustained   Following  Commands: Follows one step commands with increased time;Follows multi-step commands inconsistently Safety/Judgement: Decreased awareness of safety;Decreased awareness of deficits Awareness: Intellectual Problem Solving: Slow processing;Decreased  initiation;Difficulty sequencing;Requires verbal cues;Requires tactile cues General Comments: Pt able to state date; increased time for recall of name and address;pt sometimes talking off topic stating unrelated material. Pt unable to explain thought process from one topic to another.      Exercises      General Comments General comments (skin integrity, edema, etc.): BP sitting and supine 90's/70's, after transfer to [facility] 86/41, after 3 mins 80/66, after 5 mins 98/76.  HR 100-109 bpm; O2 sats stable on RA. RN aware      Pertinent Vitals/Pain Pain Assessment: No/denies pain Pain Score: 3  Pain Location: chest, L hand chronic vs acute Pain Descriptors / Indicators: Sore Pain Intervention(s): Monitored during session    Home Living Family/patient expects to be discharged to:: Private residence Living Arrangements: Spouse/significant other;Children Available Help at Discharge: Family;Available 24 hours/day Type of Home: House Home Access: Stairs to enter Entrance Stairs-Rails: None Home Layout: Two level;Able to live on main level with bedroom/bathroom Home Equipment: None      Prior Function Level of Independence: Independent      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   PT Goals (current goals can now be found in the care plan section) Acute Rehab PT Goals Patient Stated Goal: to see his wife Progress towards PT goals: Progressing toward goals    Frequency    Min 4X/week      PT Plan Current plan remains appropriate    Co-evaluation              AM-PAC PT "6 Clicks" Mobility   Outcome Measure  Help needed turning from your back to your side while in a flat bed without using bedrails?: A Little Help needed moving from lying on your back to sitting on the side of a flat bed without using bedrails?: A Little Help needed moving to and from a bed to a chair (including a wheelchair)?: A Lot Help needed standing up from a chair using your arms (e.g., wheelchair or  bedside chair)?: A Lot Help needed to walk in hospital room?: A Lot Help needed climbing 3-5 steps with a railing? : Total 6 Click Score: 13    End of Session Equipment Utilized During Treatment: Gait belt Activity Tolerance: Patient tolerated treatment well Patient left: in bed;with call bell/phone within reach;with family/visitor present;with bed alarm set Nurse Communication: Mobility status PT Visit Diagnosis: Unsteadiness on feet (R26.81);Other abnormalities of gait and mobility (R26.89)     Time: 1530-1600 PT Time Calculation (min) (ACUTE ONLY): 30 min  Charges:  $Therapeutic Activity: 23-37 mins                     Edward Mendoza, PT Acute Rehab Services Pager [phone] [facility] [phone] [facility] [phone]    Edward Mendoza 07/18/2019, 4:18 PM

## 2019-07-18 NOTE — Progress Notes (Signed)
NAME:  Edward Mendoza, MRN:  [id number], DOB:  08/16/1970, LOS: 7 ADMISSION DATE:  07/11/2019, CONSULTATION DATE:   REFERRING MD:  Dr. Launa Grill , CHIEF COMPLAINT:  Cardiac arrest  Brief History   49 year old male, 1ppd smoker, admitted 3/10 s/p VF/VT cardaic arrest in setting of anterior STEMI. Underwent 45-50 minutes of CPR before ROSC.  LHC with 99% proximal-LAD occlusion s/p PCI with 3.5 x 18 Onyx DES. Right heart cath demonstrated mildly elevated filling pressures (RA 12, PCW 23) and preserve cardiac output with MVO2 67%.     Past Medical History  Tobacco Abuse  Significant Hospital Events   3/10 Admit post VF/VT arrest. Cath performed with stent placement to LAD 3/15 Extubated   Consults:  PCCM  Procedures:  [place] 3/10 >> 3/16 ETT 3/10 >> 3/13 (self extubated), 3/14 >> 3/15 RUE PIC 3/16 >>   Significant Diagnostic Tests:   CT head  3/10 >> R cerebellar infarct which appears remote or subacute in nature.   PCI 3/10 >> moderate LV systolic dysfunction, LVEDP moderately elevated, LVEF 35-45%, no AV stenosis, proximal LAD to Mid LAD lesion is 95% stenosed s/p DES to LAD  CT Head 3/13 >> new wedge-shaped hypodensity involving the parasagittal right occipital lobe, suspicious for an evolving acute ischemic infarct, right PCA territory, additional subtle hypodensity at the subdcortical white matter of the anterior right frontal lobe, chronic right cerebellar infarct   TTE 3/10 >> LVEF ~ 15%, LV has severely decreased function with global hypokinesis, moderate LVH, RV systolic function mildly reduced, trivial MVR  TTE 3/13 >> LVEF ~ 20-25%, LV has severely decreased function, severe hypokinesis of the LV, entire anteroseptal wall, anterior wall and apical segment, moderate hypokinesis of the LV, mid-apical inferior wall, trivial pericardial effusion  Micro Data:  COVID 3/10 >> negative  MRSA PCR 3/10 >> negative Tracheal aspirate 3/13 >> few candida albicans   Antimicrobials:    Cefepime 3/13 >>   Interim history/subjective:  Pt reports feeling sore in his chest and bottom.  Denies SOB. States he will quit smoking.  Notes the food is bad and would like a [place].   Glucose range 100-200 Tmax 99.5 / WBC 12.2 I/O 3.5L UOP, -2.3L in last 24 hours   Objective   Blood pressure 92/68, pulse (!) 101, temperature 99.5 F (37.5 C), temperature source Oral, resp. rate (!) 24, height 6\' 1"  (1.854 m), weight 96.4 kg, SpO2 94 %. PAP: (19-41)/(9-26) 32/19 CVP:  [0 mmHg-15 mmHg] 9 mmHg      Intake/Output Summary (Last 24 hours) at 07/18/2019 0939 Last data filed at 07/18/2019 4081 Gross per 24 hour  Intake 1010.28 ml  Output 2775 ml  Net -1764.72 ml   Filed Weights   07/16/19 0600 07/17/19 0500 07/18/19 0600  Weight: 101.3 kg 95.9 kg 96.4 kg    Examination: General: adult male sitting up in recliner in NAD HEENT: MM pink/moist, good dentition, anicteric, pupils =/reactive Neuro: Awake / alert & oriented. Tearful at times when talking about his admission. Speech clear but cautiously slow in answering  CV: s1s2 regular, Aflutter on monitor in 100's, no m/r/g PULM: non-labored on RA, lungs bilaterally clear  GI: soft, bsx4 active  Extremities: warm/dry, no edema  Skin: no rashes or lesions  Resolved Hospital Problem list      Assessment & Plan:   VT/ VF Cardiac Arrest 2/2 Anterior STEMI s/p PCI w/ pLAD DES  Cardiogenic Shock  Acute Systolic CHF  AF /  AFlutter Approximate 45-50 minute downtime, reduced LVEF to ~20%.  LAD with 99% occlusion -per Cardiology  -amiodarone per CHF team  -continue ASA, eliquis, plavix, digoxin, spironolactone   Acute Encephalopathy post Cardiac Arrest, Strokes on CT head EEG negative  -supportive care  -PT / OT efforts  -stop keppra since EEG negative  -monitor closely for seizures -MRI brain per Cardiology  -pending above, consider Neurology evaluation   Acute Respiratory Failure in setting of Cardiac  Arrest Multifocal Infiltrates suspicious for Aspiration vs. HCAP  Treated for suspected aspiration with cefepime, planned duration of 7 days.  -continue cefepime D5/7.  Stop date added  -pulmonary hygiene - IS, mobilize  -SLP evaluation appreciated   AKI  Hypokalemia In setting of hypotension  -Trend BMP / urinary output -Replace electrolytes as indicated -scheduled KCL 20 mEq BID  -Avoid nephrotoxic agents, ensure adequate renal perfusion  Shock Liver Improving LFT's  -follow trend    PCCM will be available PRN.  Please call back if new needs arise.    Canary Brim, MSN, NP-C [facility] 07/18/2019, 9:40 AM   Please see [email or web address] for pager details.

## 2019-07-18 NOTE — Evaluation (Signed)
Occupational Therapy Evaluation Patient Details Name: Edward Mendoza MRN: [id number] DOB: 1971-02-09 Today's Date: 07/18/2019    History of Present Illness Patient is a 49 y/o male who presents on 3/10 s/p cardiac arrest from Vfib/Vtach, down for 45 minutes. Admitted with STEMI s/p PCI to proximal LAD, self extubated 3/12, brief arrest 3/13 when putting in NG tube, re-intubated 3/13-3/15. Head CT 3/13-new wedge-shaped hypodensity involving the parasagittal right occipital lobe, suspicious for an evolving acute ischemic infarct, right PCA territory. PMH includes smoker.   Clinical Impression   Pt PTA: pt living at home with spouse and independent; working. Pt currently, Pt limited by decreased safety and awareness of deficits, decreased ability to care for self and decreased strength/coordination. Pt's BUEs L>R decreased coordination. Pt maxA for stand pivot to recliner; modA for bed mobility. Pt with questionable mentation at times talking about unrelated material. Pt would greatly benefit from continued OT skilled services for ADL, mobility and mentation. Pt appears very motivated to return to PLOF. OT following acutely.  BP is supine: 121/73; sitting 107/67 (78),95% O2 on RA, 106 BPM; standing 121/115 (113), 132 BPM, 90% O2; seated in recliner after exertion: 98/73, 92% O2, 105 BPM. RN aware of increased HR.      Follow Up Recommendations  CIR;Supervision/Assistance - 24 hour    Equipment Recommendations  3 in 1 bedside commode    Recommendations for Other Services       Precautions / Restrictions Precautions Precautions: Fall Restrictions Weight Bearing Restrictions: No      Mobility Bed Mobility Overal bed mobility: Needs Assistance Bed Mobility: Rolling;Supine to Sit Rolling: Min assist   Supine to sit: Mod assist;HOB elevated     General bed mobility comments: Pt reaching for hand rail and assist for BLE initiation and trunk elevation.  Transfers Overall transfer  level: Needs assistance Equipment used: 1 person hand held assist Transfers: Sit to/from [employer] Transfers Sit to Stand: Max assist;From elevated surface Stand pivot transfers: Max assist       General transfer comment: Pt requiring cues to hold onto OTR, taking BP in L arm in standing and then transferring to recliner. Pt unable to take steps safely with front to front transfer.    Balance Overall balance assessment: Needs assistance Sitting-balance support: Bilateral upper extremity supported Sitting balance-Leahy Scale: Poor Sitting balance - Comments: pt minguardA overall as pt "testing his limits" swaying side to side and forward and backward.     Standing balance-Leahy Scale: Poor Standing balance comment: Pt requiring modA overall to stand upright; attempting steps for stand pivot                           ADL either performed or assessed with clinical judgement   ADL Overall ADL's : Needs assistance/impaired Eating/Feeding: Supervision/ safety;Set up;Sitting   Grooming: Minimal assistance;Sitting   Upper Body Bathing: Minimal assistance;Sitting   Lower Body Bathing: Moderate assistance;+2 for physical assistance;Cueing for safety;Cueing for sequencing;Sitting/lateral leans;Sit to/from stand   Upper Body Dressing : Minimal assistance;Sitting;Cueing for sequencing;Cueing for safety   Lower Body Dressing: Moderate assistance;Cueing for safety;Cueing for compensatory techniques;Sitting/lateral leans;Sit to/from stand   Toilet Transfer: Maximal assistance;Stand-pivot;BSC   Toileting- Clothing Manipulation and Hygiene: Maximal assistance;Sitting/lateral lean;Sit to/from stand;Cueing for safety       Functional mobility during ADLs: Maximal assistance;Cueing for safety;Cueing for sequencing General ADL Comments: Pt limited by decreased safety and awareness of deficits, decreased ability to care for self and decreased  strength/coordination.      Vision Baseline Vision/History: No visual deficits Patient Visual Report: No change from baseline Vision Assessment?: Yes Eye Alignment: Within Functional Limits Ocular Range of Motion: Within Functional Limits Alignment/Gaze Preference: Within Defined Limits Tracking/Visual Pursuits: Able to track stimulus in all quads without difficulty Additional Comments: continue to assess     Perception     Praxis      Pertinent Vitals/Pain Pain Assessment: 0-10 Pain Score: 3  Pain Location: chest, L hand chronic vs acute Pain Descriptors / Indicators: Sore Pain Intervention(s): Monitored during session     Hand Dominance Right   Extremity/Trunk Assessment Upper Extremity Assessment Upper Extremity Assessment: Generalized weakness;RUE deficits/detail;LUE deficits/detail RUE Deficits / Details: 3/5 MM grade at shoulder; elbow through hand 3+/5 MM grade RUE Coordination: decreased gross motor LUE Deficits / Details: 3-/5 MM grade at shoulder; elbow through hand 3+/5 MM grade LUE Coordination: decreased fine motor;decreased gross motor   Lower Extremity Assessment Lower Extremity Assessment: Generalized weakness   Cervical / Trunk Assessment Cervical / Trunk Assessment: Normal   Communication Communication Communication: No difficulties   Cognition Arousal/Alertness: Awake/alert Behavior During Therapy: WFL for tasks assessed/performed Overall Cognitive Status: Impaired/Different from baseline Area of Impairment: Orientation;Safety/judgement;Awareness;Problem solving                 Orientation Level: Disoriented to;Time Current Attention Level: Sustained     Safety/Judgement: Decreased awareness of safety;Decreased awareness of deficits Awareness: Intellectual Problem Solving: Slow processing;Decreased initiation;Difficulty sequencing;Requires verbal cues;Requires tactile cues General Comments: Pt able to state date; increased time for recall of name and  address;pt sometimes talking off topic stating unrelated material. Pt unable to explain thought process from one topic to another.   General Comments  BP is supine: 121/73; sitting 107/67 (78),95% O2 on RA, 106 BPM; standing 121/115 (113), 132 BPM, 90% O2; seated in recliner after exertion: 98/73, 92% O2, 105 BPM. RN aware of increased HR.    Exercises Exercises: Other exercises   Shoulder Instructions      Home Living Family/patient expects to be discharged to:: Private residence Living Arrangements: Spouse/significant other;Children Available Help at Discharge: Family;Available 24 hours/day Type of Home: House Home Access: Stairs to enter [employer] of Steps: 2 Entrance Stairs-Rails: None Home Layout: Two level;Able to live on main level with bedroom/bathroom Alternate Level Stairs-Number of Steps: 1 flight   Bathroom Shower/Tub: Chief Strategy Officer: Standard     Home Equipment: None          Prior Functioning/Environment Level of Independence: Independent        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        OT Problem List: Decreased strength;Decreased activity tolerance;Impaired balance (sitting and/or standing);Decreased safety awareness;Pain;Decreased coordination;Decreased cognition;Impaired vision/perception;Increased edema      OT Treatment/Interventions: Self-care/ADL training;Therapeutic exercise;Neuromuscular education;Energy conservation;Therapeutic activities;Patient/family education;Balance training;Visual/perceptual remediation/compensation    OT Goals(Current goals can be found in the care plan section) Acute Rehab OT Goals Patient Stated Goal: to see his wife OT Goal Formulation: With patient Time For Goal Achievement: 08/01/19 Potential to Achieve Goals: Good ADL Goals Pt Will Perform Grooming: with min guard assist;standing Pt Will Perform Lower Body Dressing: with min guard assist;sit to/from stand Pt Will Transfer to  Toilet: with min assist;ambulating;bedside commode Pt/caregiver will Perform Home Exercise Program: Increased strength;Right Upper extremity;Left upper extremity;With Supervision Additional ADL Goal #1: Pt will perform multi-step commands with 90% accuracy in 2/3 trials with minimal cues to stay on task. Additional ADL Goal #  2: Pt will increase to standing tolerance of 6 mins in prep for ADL with minguardA.  OT Frequency: Min 3X/week   Barriers to D/C:            Co-evaluation              AM-PAC OT "6 Clicks" Daily Activity     Outcome Measure Help from another person eating meals?: A Little Help from another person taking care of personal grooming?: A Little Help from another person toileting, which includes using toliet, bedpan, or urinal?: A Lot Help from another person bathing (including washing, rinsing, drying)?: A Lot Help from another person to put on and taking off regular upper body clothing?: A Little Help from another person to put on and taking off regular lower body clothing?: A Lot 6 Click Score: 15   End of Session Equipment Utilized During Treatment: Gait belt Nurse Communication: Mobility status  Activity Tolerance: Patient tolerated treatment well Patient left: in chair;with call bell/phone within reach;with chair alarm set  OT Visit Diagnosis: Unsteadiness on feet (R26.81);Muscle weakness (generalized) (M62.81);Pain;Other symptoms and signs involving cognitive function Pain - Right/Left: Left Pain - part of body: Hand                Time: 1540-0867 OT Time Calculation (min): 38 min Charges:  OT General Charges $OT Visit: 1 Visit OT Evaluation $OT Eval Moderate Complexity: 1 Mod OT Treatments $Self Care/Home Management : 8-22 mins $Neuromuscular Re-education: 8-22 mins  Jefferey Pica, OTR/L Acute Rehabilitation Services Pager: [phone] Office: [phone]   Alonda Weaber C 07/18/2019, 3:05 PM

## 2019-07-19 LAB — CBC
HCT: 37.5 % — ABNORMAL LOW (ref 39.0–52.0)
HCT: 37.7 % — ABNORMAL LOW (ref 39.0–52.0)
Hemoglobin: 12.6 g/dL — ABNORMAL LOW (ref 13.0–17.0)
Hemoglobin: 12.8 g/dL — ABNORMAL LOW (ref 13.0–17.0)
MCH: 30.2 pg (ref 26.0–34.0)
MCH: 31 pg (ref 26.0–34.0)
MCHC: 33.6 g/dL (ref 30.0–36.0)
MCHC: 34 g/dL (ref 30.0–36.0)
MCV: 89.9 fL (ref 80.0–100.0)
MCV: 91.3 fL (ref 80.0–100.0)
Platelets: 239 10*3/uL (ref 150–400)
Platelets: 252 10*3/uL (ref 150–400)
RBC: 4.17 MIL/uL — ABNORMAL LOW (ref 4.22–5.81)
RBC: 4.13 MIL/uL — ABNORMAL LOW (ref 4.22–5.81)
RDW: 12.3 % (ref 11.5–15.5)
RDW: 12.4 % (ref 11.5–15.5)
WBC: 16.8 10*3/uL — ABNORMAL HIGH (ref 4.0–10.5)
WBC: 17.1 10*3/uL — ABNORMAL HIGH (ref 4.0–10.5)
nRBC: 0 % (ref 0.0–0.2)
nRBC: 0 % (ref 0.0–0.2)

## 2019-07-19 LAB — COOXEMETRY PANEL
Carboxyhemoglobin: 2.2 % — ABNORMAL HIGH (ref 0.5–1.5)
Carboxyhemoglobin: 1.8 % — ABNORMAL HIGH (ref 0.5–1.5)
Methemoglobin: 0.9 % (ref 0.0–1.5)
Methemoglobin: 1.1 % (ref 0.0–1.5)
O2 Saturation: 90.5 %
O2 Saturation: 58.7 %
Total hemoglobin: 13 g/dL (ref 12.0–16.0)
Total hemoglobin: 16.7 g/dL — ABNORMAL HIGH (ref 12.0–16.0)

## 2019-07-19 LAB — COMPREHENSIVE METABOLIC PANEL
ALT: 109 U/L — ABNORMAL HIGH (ref 0–44)
AST: 89 U/L — ABNORMAL HIGH (ref 15–41)
Albumin: 2.3 g/dL — ABNORMAL LOW (ref 3.5–5.0)
Alkaline Phosphatase: 138 U/L — ABNORMAL HIGH (ref 38–126)
Anion gap: 9 (ref 5–15)
BUN: 41 mg/dL — ABNORMAL HIGH (ref 6–20)
CO2: 23 mmol/L (ref 22–32)
Calcium: 8.8 mg/dL — ABNORMAL LOW (ref 8.9–10.3)
Chloride: 103 mmol/L (ref 98–111)
Creatinine, Ser: 1.14 mg/dL (ref 0.61–1.24)
GFR calc Af Amer: >60 mL/min (ref >60)
GFR calc non Af Amer: >60 mL/min (ref >60)
Glucose, Bld: 110 mg/dL — ABNORMAL HIGH (ref 70–99)
Potassium: 4.2 mmol/L (ref 3.5–5.1)
Sodium: 135 mmol/L (ref 135–145)
Total Bilirubin: 0.9 mg/dL (ref 0.3–1.2)
Total Protein: 6.5 g/dL (ref 6.5–8.1)

## 2019-07-19 LAB — GLUCOSE, CAPILLARY
Glucose-Capillary: 102 mg/dL — ABNORMAL HIGH (ref 70–99)
Glucose-Capillary: 104 mg/dL — ABNORMAL HIGH (ref 70–99)
Glucose-Capillary: 135 mg/dL — ABNORMAL HIGH (ref 70–99)
Glucose-Capillary: 86 mg/dL (ref 70–99)
Glucose-Capillary: 91 mg/dL (ref 70–99)

## 2019-07-19 LAB — PROCALCITONIN: Procalcitonin: 0.27 ng/mL

## 2019-07-19 MED ORDER — AMIODARONE IV BOLUS ONLY 150 MG/100ML
150.0000 mg | Freq: Once | INTRAVENOUS | Status: AC
  Administered 2019-07-19: 150 mg via INTRAVENOUS
  Filled 2019-07-19: qty 100

## 2019-07-19 MED ORDER — FUROSEMIDE 20 MG PO TABS
20.0000 mg | ORAL_TABLET | Freq: Every day | ORAL | Status: DC
  Administered 2019-07-19 – 2019-07-22 (×4): 20 mg via ORAL
  Filled 2019-07-19 (×4): qty 1

## 2019-07-19 MED ORDER — TRAMADOL HCL 50 MG PO TABS
50.0000 mg | ORAL_TABLET | Freq: Four times a day (QID) | ORAL | Status: DC | PRN
  Administered 2019-07-19 – 2019-07-21 (×3): 50 mg via ORAL
  Filled 2019-07-19 (×3): qty 1

## 2019-07-19 NOTE — Progress Notes (Signed)
Pt c/o 6-8/10 right lateral chest wall pain worse with inspiration. Tylenol given w/no improvement. Vital signs stable. NP on call notified.  Will continue to monitor.

## 2019-07-19 NOTE — Progress Notes (Addendum)
Advanced Heart Failure Rounding Note  PCP-Cardiologist: No primary care provider on file.   Subjective:    Events:  3/10 admitted with anterior STEMI and VF arrest with prolonged resuscitation s/p PCI LAD 3/12 self extubated. Developed recurrent respiratory failure and reintubated 3/15 Extubated Transferred out of CCU 3/17   On amio drip for A flutter. V-rates low 100s.   IV Lasix held yesterday for low CVP and AKI. Wt now up 6 lb, 212>>218 lb. AKI resolved. SCr 1.4>>1.1. BP remains soft but stable.   Still has PICC but CVP not set up.  No Co-ox drawn yet.   WBC trending up, 12.2>>16.8. AF. Denies fever/chills. No dyspnea or dysuria.   Sitting up in bed eating breakfast. Wife and son at bedside. Denies CP. No dyspnea. Did not sleep well last night.    Objective:   Weight Range: 98.9 kg Body mass index is 28.77 kg/m.   Vital Signs:   Temp:  [98.2 F (36.8 C)-99.5 F (37.5 C)] 99.4 F (37.4 C) (03/18 0029) Pulse Rate:  [94-105] 105 (03/17 1813) Resp:  [19-25] 22 (03/17 1813) BP: (93-102)/(66-80) 99/74 (03/18 0029) SpO2:  [93 %-96 %] 93 % (03/17 1813) Weight:  [98.9 kg] 98.9 kg (03/18 0500) Last BM Date: 07/18/19  Weight change: Filed Weights   07/17/19 0500 07/18/19 0600 07/19/19 0500  Weight: 95.9 kg 96.4 kg 98.9 kg    Intake/Output:   Intake/Output Summary (Last 24 hours) at 07/19/2019 0710 Last data filed at 07/19/2019 0030 Gross per 24 hour  Intake 168.29 ml  Output 770 ml  Net -601.71 ml      Physical Exam   PHYSICAL EXAM: General:  Well appearing, middle aged WM. No respiratory difficulty HEENT: normal Neck: supple. no JVD. Carotids 2+ bilat; no bruits. No lymphadenopathy or thyromegaly appreciated. Cor: PMI nondisplaced. Irregular rhythm, tachy rate. No rubs, gallops or murmurs. Lungs: decreased BS at bases w/ faint bibasilar crackles  Abdomen: soft, nontender, nondistended. No hepatosplenomegaly. No bruits or masses. Good bowel  sounds. Extremities: no cyanosis, clubbing, rash, edema +RUE PICC Neuro: alert & oriented x 3, cranial nerves grossly intact. moves all 4 extremities w/o difficulty. Affect pleasant.   Telemetry   Atrial Flutter 106 bpm   Labs    CBC Recent Labs    07/18/19 0427 07/19/19 0257  WBC 12.2* 16.8*  HGB 12.2* 12.6*  HCT 37.1* 37.5*  MCV 92.3 89.9  PLT 206 638   Basic Metabolic Panel Recent Labs    07/18/19 0427 07/19/19 0257  NA 136 135  K 3.5 4.2  CL 95* 103  CO2 24 23  GLUCOSE 202* 110*  BUN 38* 41*  CREATININE 1.38* 1.14  CALCIUM 8.6* 8.8*   Liver Function Tests Recent Labs    07/18/19 0427 07/19/19 0257  AST 86* 89*  ALT 92* 109*  ALKPHOS 125 138*  BILITOT 1.0 0.9  PROT 6.3* 6.5  ALBUMIN 2.2* 2.3*   No results for input(s): LIPASE, AMYLASE in the last 72 hours. Cardiac Enzymes No results for input(s): CKTOTAL, CKMB, CKMBINDEX, TROPONINI in the last 72 hours.  BNP: BNP (last 3 results) Recent Labs    07/11/19 0533  BNP 23.3    ProBNP (last 3 results) No results for input(s): PROBNP in the last 8760 hours.   D-Dimer No results for input(s): DDIMER in the last 72 hours. Hemoglobin A1C No results for input(s): HGBA1C in the last 72 hours. Fasting Lipid Panel No results for input(s): CHOL, HDL, LDLCALC, TRIG,  CHOLHDL, LDLDIRECT in the last 72 hours. Thyroid Function Tests No results for input(s): TSH, T4TOTAL, T3FREE, THYROIDAB in the last 72 hours.  Invalid input(s): FREET3  Other results:   Imaging    MR BRAIN WO CONTRAST  Result Date: 07/18/2019 CLINICAL DATA:  Cardiac arrest 1 week ago. Resuscitation. Encephalopathy. EXAM: MRI HEAD WITHOUT CONTRAST TECHNIQUE: Multiplanar, multiecho pulse sequences of the brain and surrounding structures were obtained without intravenous contrast. COMPARISON:  Head CT 07/14/2018 FINDINGS: Brain: There is an old inferior cerebellar infarction on the right. There are innumerable punctate acute infarctions  throughout both cerebellar hemispheres and both cerebral hemispheres consistent with micro embolic infarctions from the heart or ascending aorta. Largest area of infarction is about 1.2 cm in size at the right medial parietooccipital junction. The study does not show atypical diffuse hypoxic ischemic pattern. Deep brain structures appear intact. No evidence of mass, hemorrhage, hydrocephalus or extra-axial collection. Vascular: Major vessels at the base of the brain show flow. Skull and upper cervical spine: Negative Sinuses/Orbits: Sphenoid sinusitis. Possible fungus ball in the right division of the sphenoid sinus. Other: None IMPRESSION: Innumerable punctate acute infarctions scattered throughout the cerebellum and both cerebral hemispheres most consistent with micro embolic infarctions from the heart or ascending aorta. The study does not show a typical pattern of diffuse hypoxic ischemic injury. 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 Sphenoid sinusitis. 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 This appearance could also be due to chronic inspissated mucus. Electronically Signed   By: Nelson Chimes M.D.   On: 07/18/2019 12:29     Medications:     Scheduled Medications: . apixaban  5 mg Oral BID  . aspirin  81 mg Oral Daily  . chlorhexidine gluconate (MEDLINE KIT)  15 mL Mouth Rinse BID  . Chlorhexidine Gluconate Cloth  6 each Topical Daily  . clopidogrel  75 mg Oral Daily  . digoxin  0.125 mg Per Tube Daily  . feeding supplement (ENSURE ENLIVE)  237 mL Oral TID BM  . insulin aspart  0-9 Units Subcutaneous Q4H  . multivitamin with minerals  1 tablet Oral Daily  . pantoprazole  40 mg Oral Daily  . potassium chloride  20 mEq Oral BID  . sodium chloride flush  10-40 mL Intracatheter Q12H  . sodium chloride flush  10-40 mL Intracatheter Q12H  . spironolactone  25 mg Oral Daily    Infusions: . sodium chloride 10 mL/hr at 07/18/19 0800  . amiodarone 30 mg/hr (07/19/19 0032)  . ceFEPime  (MAXIPIME) IV 2 g (07/19/19 0654)    PRN Medications: acetaminophen (TYLENOL) oral liquid 160 mg/5 mL, sodium chloride flush, sodium chloride flush    Assessment/Plan   1. Witnessed Out of Hospital VF Arrest in setting of anterior STEMI  - Witnessed by EMS w/ prompt initiation of ACLS - Estimated 40-50 min resucitation time - Secondary to acute anterior STEMI w/ proximal LAD occlusion, s/p PCI  - EF 15% - s/p hypothermia  - Cognitively intact.   2. Anterior STEMI - LHC showed 99% proximal LAD thrombotic occlusion, s/p PCI + DES placement. No other significant disease - EF 15%.  - Continue DAPT w/ ASA + plavix due to addition of eliquis.  - no statin currently w/ elevated LFTs  ( in a flutter) - no ? blocker w/ low output   3. Acute systolic HF -> Cardiogenic Shock  - initial echo 07/11/19 EF 15%. RV systolic function mildly reduced  - f/u echo on 3/13 LVEF 20-25% - Lasix  held yesterday for low CVP and AKI. Wt now up 6 lb - RN reports issues w/ PICC today (no blood return on attempted draws). Will ask IV team to assess line. Check co-ox when fixed.  - set up CVP. Discussed w/ RN.  - will resume low dose PO Lasix 20 qd - Continue digoxin 0.125 mcg - Continue spironolactone 25 mg daily  - BP too soft for ARB/ARNI - Renal function stable.    4. Acute hypoxic respiratory failure. -In setting of VF arrest. -Self extubated 3/12.  Reintubated 3/12 due to recurrent respiratory failure Extubated 3/15. Sats stable on room air.   - Continue cefipime. - Trach aspirate NGTD - WBC trending back up. AF. Lung exam + for crackles at LLL but no dyspnea (atelectasis vs infiltrate noted on CXR 3/16).  - If WBC continues to trend up, may need to broaden abx and repeat CXR  5. Post MI Atrial Fibrillation/A flutter  - Amio stopped 07/16/19 but restarted with a flutter.  - Continue amio drip. Discussed w/ Dr. Haroldine Laws. Will rebolus now 150 mg x1  - Continue eliquis 5 mg twice a day. See  above.  - Will need TEE/DCCV prior to d/c if no chemical conversion   5. Shock Liver:  - 2/2 Acute MI/ cardiogenic shock  - Overall improved, however LFTs starting to trend back up slightly. - off statin. On amiodarone.  - Monitor  6. AKI:  - improved, SCr 1.4>>1.1    7. Insomnia:  - poor sleep last night - I offered sleep aid. Pt adamantly refused    Continue to mobilize w/ PT/ CR   Length of Stay: [address] PA-C  07/19/2019, 7:10 AM  Advanced Heart Failure Team Pager [phone] (M-F; [place])  Please contact [facility] for night-coverage after hours (4p -7a ) and weekends on [email or web address]  Patient seen and examined with the above-signed Advanced Practice Provider and/or Housestaff. I personally reviewed laboratory data, imaging studies and relevant notes. I independently examined the patient and formulated the important aspects of the plan. I have edited the note to reflect any of my changes or salient points. I have personally discussed the plan with the patient and/or family.  Co-ox marginal off pressors. Volume status going back up. Remains in AFL despite IV amio.   WBC trending up and having more secretions. On cefipime currently.   MRI with multiple embolic infarcts.   General:  Sitting up . No resp difficulty HEENT: normal Neck: supple. JVP 8. Carotids 2+ bilat; no bruits. No lymphadenopathy or thryomegaly appreciated. Cor: PMI nondisplaced. Irregular rate & rhythm. No rubs, gallops or murmurs. Lungs: + crackles  Abdomen: soft, nontender, nondistended. No hepatosplenomegaly. No bruits or masses. Good bowel sounds. Extremities: no cyanosis, clubbing, rash, tr edema Neuro: alert & orientedx3, cranial nerves grossly intact. moves all 4 extremities w/o difficulty. Affect pleasant  Remains tenuous. Agree with restarting lasix. Will get CXR and check PCT to watch for worsening PNA. Tolerating Eliquis. Will need eventual TEE/DC-CV when more stable.    Glori Bickers, MD  6:24 PM

## 2019-07-19 NOTE — Progress Notes (Addendum)
Physical Therapy Treatment Patient Details Name: Edward Mendoza MRN: [id number] DOB: 1970/05/05 Today's Date: 07/19/2019    History of Present Illness Patient is a 49 y/o male who presents on 3/10 s/p cardiac arrest from Vfib/Vtach, down for 45 minutes. Admitted with STEMI s/p PCI to proximal LAD, self extubated 3/12, brief arrest 3/13 when putting in NG tube, re-intubated 3/13-3/15. Head CT 3/13-new wedge-shaped hypodensity involving the parasagittal right occipital lobe, suspicious for an evolving acute ischemic infarct, right PCA territory. PMH includes smoker.    PT Comments    Pt making excellent progress towards his physical therapy goals today, demonstrating improved activity tolerance and increased ambulation distance. Ambulating 250 feet with a walker at a min guard assist level. HR peak 118 bpm, BP stable with transitions. Demonstrates lower extremity weakness and balance impairments. Education provided regarding expected activity recommendations and progression. Updated d/c plan in light of pt progress.  Vital Signs:  BP supine: 99/74 BP sitting: 113/82 BP post mobility: 110/77   Follow Up Recommendations  Home health PT;Supervision/Assistance - 24 hour     Equipment Recommendations  Rolling walker with 5" wheels    Recommendations for Other Services       Precautions / Restrictions Precautions Precautions: Fall Restrictions Weight Bearing Restrictions: No    Mobility  Bed Mobility Overal bed mobility: Needs Assistance Bed Mobility: Supine to Sit     Supine to sit: Min guard     General bed mobility comments: Cues for transfer technique and use of bed rail, min guard for safety  Transfers Overall transfer level: Needs assistance Equipment used: Rolling walker (2 wheeled) Transfers: Sit to/from Stand Sit to Stand: Min guard         General transfer comment: Cues for hand placement  Ambulation/Gait Ambulation/Gait assistance: Min guard Gait Distance  (Feet): 250 Feet Assistive device: Rolling walker (2 wheeled) Gait Pattern/deviations: Decreased stride length;Step-through pattern;Decreased dorsiflexion - left;Decreased dorsiflexion - right Gait velocity: decreased   General Gait Details: Min guard for stability, irregular foot placement and decreased bilateral heel strike at initial contact. Cues for walker proximity.   Stairs             Wheelchair Mobility    Modified Rankin (Stroke Patients Only)       Balance Overall balance assessment: Needs assistance Sitting-balance support: Feet supported Sitting balance-Leahy Scale: Fair     Standing balance support: Bilateral upper extremity supported;During functional activity Standing balance-Leahy Scale: Poor Standing balance comment: reliant on external support                            Cognition Arousal/Alertness: Awake/alert Behavior During Therapy: WFL for tasks assessed/performed Overall Cognitive Status: Within Functional Limits for tasks assessed                                 General Comments: Pt wife and son report pt cognition at baseline      Exercises General Exercises - Lower Extremity Heel Slides: Both;10 reps;Supine Straight Leg Raises: Both;10 reps;Supine    General Comments        Pertinent Vitals/Pain Pain Assessment: Faces Faces Pain Scale: Hurts a little bit Pain Location: chest Pain Descriptors / Indicators: Sore Pain Intervention(s): Monitored during session    Home Living  Prior Function            PT Goals (current goals can now be found in the care plan section) Acute Rehab PT Goals Patient Stated Goal: go home Potential to Achieve Goals: Good Progress towards PT goals: Progressing toward goals    Frequency    Min 4X/week      PT Plan Discharge plan needs to be updated    Co-evaluation              AM-PAC PT "6 Clicks" Mobility   Outcome Measure   Help needed turning from your back to your side while in a flat bed without using bedrails?: None Help needed moving from lying on your back to sitting on the side of a flat bed without using bedrails?: A Little Help needed moving to and from a bed to a chair (including a wheelchair)?: A Little Help needed standing up from a chair using your arms (e.g., wheelchair or bedside chair)?: A Little Help needed to walk in hospital room?: A Little Help needed climbing 3-5 steps with a railing? : A Lot 6 Click Score: 18    End of Session Equipment Utilized During Treatment: Gait belt Activity Tolerance: Patient tolerated treatment well Patient left: in chair;with call bell/phone within reach;with family/visitor present Nurse Communication: Mobility status PT Visit Diagnosis: Unsteadiness on feet (R26.81);Other abnormalities of gait and mobility (R26.89)     Time: 1105-1140 PT Time Calculation (min) (ACUTE ONLY): 35 min  Charges:  $Gait Training: 8-22 mins $Therapeutic Activity: 8-22 mins                       Wyona Almas, PT, DPT Acute Rehabilitation Services Pager [phone] Office [phone]    Deno Etienne 07/19/2019, 3:04 PM

## 2019-07-19 NOTE — Progress Notes (Signed)
CARDIAC REHAB PHASE I   PRE:  Rate/Rhythm: 103 Afib  BP:  Sitting: 133/82      SaO2: 93 RA  MODE:  Ambulation: 400 ft   POST:  Rate/Rhythm: 118 Afib  BP:  Sitting: pt in BR    SaO2: 93 RA  Pt ambulated 426ft in hallway assist of one with rollator. Pt c/o some "lightheadedness", and fatigue. Pt then helped to BR. Spoke with pt family about importance of balancing rest and ambulation. Pts family given stent card, MI book, and HF booklet. Educated on importance ASA and Plavix. Will continue to follow to support and encourage the pt.  [id number] Reynold Bowen, RN BSN 07/19/2019 2:43 PM

## 2019-07-19 NOTE — Progress Notes (Signed)
Inpatient Rehab Admissions:  Inpatient Rehab Consult received.  I met with patient at the bedside for rehabilitation assessment and to discuss goals and expectations of an inpatient rehab admission.  He is not interested in CIR at this time.  States he is unhappy with his hospital experience and is anxious to get home.  Note still on amio drip, so not medically ready at this time.  Will follow from a distance for a few more days, but I did let CM know his current wishes.   Signed: Shann Medal, PT, DPT Admissions Coordinator [phone] 07/19/19  1:33 PM

## 2019-07-20 ENCOUNTER — Inpatient Hospital Stay (HOSPITAL_COMMUNITY): Payer: Managed Care, Other (non HMO)

## 2019-07-20 LAB — BASIC METABOLIC PANEL
Anion gap: 11 (ref 5–15)
BUN: 37 mg/dL — ABNORMAL HIGH (ref 6–20)
CO2: 21 mmol/L — ABNORMAL LOW (ref 22–32)
Calcium: 8.7 mg/dL — ABNORMAL LOW (ref 8.9–10.3)
Chloride: 103 mmol/L (ref 98–111)
Creatinine, Ser: 1.09 mg/dL (ref 0.61–1.24)
GFR calc Af Amer: >60 mL/min (ref >60)
GFR calc non Af Amer: >60 mL/min (ref >60)
Glucose, Bld: 119 mg/dL — ABNORMAL HIGH (ref 70–99)
Potassium: 4 mmol/L (ref 3.5–5.1)
Sodium: 135 mmol/L (ref 135–145)

## 2019-07-20 LAB — GLUCOSE, CAPILLARY
Glucose-Capillary: 101 mg/dL — ABNORMAL HIGH (ref 70–99)
Glucose-Capillary: 104 mg/dL — ABNORMAL HIGH (ref 70–99)
Glucose-Capillary: 106 mg/dL — ABNORMAL HIGH (ref 70–99)
Glucose-Capillary: 107 mg/dL — ABNORMAL HIGH (ref 70–99)
Glucose-Capillary: 125 mg/dL — ABNORMAL HIGH (ref 70–99)
Glucose-Capillary: 91 mg/dL (ref 70–99)
Glucose-Capillary: 91 mg/dL (ref 70–99)

## 2019-07-20 LAB — COOXEMETRY PANEL
Carboxyhemoglobin: 2 % — ABNORMAL HIGH (ref 0.5–1.5)
Methemoglobin: 1.1 % (ref 0.0–1.5)
O2 Saturation: 62.1 %
Total hemoglobin: 12.2 g/dL (ref 12.0–16.0)

## 2019-07-20 LAB — PROCALCITONIN: Procalcitonin: 0.24 ng/mL

## 2019-07-20 LAB — PROTIME-INR
INR: 1.2 (ref 0.8–1.2)
Prothrombin Time: 15.4 seconds — ABNORMAL HIGH (ref 11.4–15.2)

## 2019-07-20 MED ORDER — INSULIN ASPART 100 UNIT/ML ~~LOC~~ SOLN: 0.0000 [IU] | Freq: Three times a day (TID) | SUBCUTANEOUS | Status: DC

## 2019-07-20 MED ORDER — FUROSEMIDE 10 MG/ML IJ SOLN
40.0000 mg | Freq: Once | INTRAMUSCULAR | Status: AC
  Administered 2019-07-20: 40 mg via INTRAVENOUS
  Filled 2019-07-20: qty 4

## 2019-07-20 MED ORDER — LOSARTAN POTASSIUM 25 MG PO TABS
25.0000 mg | ORAL_TABLET | Freq: Every day | ORAL | Status: DC
  Administered 2019-07-20: 25 mg via ORAL
  Filled 2019-07-20: qty 1

## 2019-07-20 MED ORDER — SODIUM CHLORIDE 0.9 % IV SOLN: INTRAVENOUS | Status: DC

## 2019-07-20 NOTE — Progress Notes (Signed)
Inpatient Rehab Admissions Coordinator:   Dicussed with PT, updated recs to home health. CIR will sign off at this time.   Estill Dooms, PT, DPT Admissions Coordinator [phone] 07/20/19  11:25 AM

## 2019-07-20 NOTE — Progress Notes (Signed)
Patient has refused chest xray. States he will not go until he speaks to a physician "that he hasn't seen in two days". Will notify day shift. Shanena Pellegrino RN

## 2019-07-20 NOTE — Progress Notes (Signed)
  Speech Language Pathology Treatment: Dysphagia  Patient Details Name: Edward Mendoza MRN: [id number] DOB: 1970-05-06 Today's Date: 07/20/2019 Time: 6803-2122 SLP Time Calculation (min) (ACUTE ONLY): 11 min  Assessment / Plan / Recommendation Clinical Impression  Pt was seen for dysphagia treatment and was cooperative throughout the session. He was in a reclined position throughout the session due to c/o chest pain. Pt, family, and nursing reported that the pt has been tolerating the current diet without overt s/sx of aspiration. Pt tolerated mixed consistency boluses (i.e., regular texture solids with thin liquids), regular texture solids, and thin liquids via straw using consecutive swallows without symptoms of oropharyngeal dysphagia despite his  suboptimal positioning. It is recommended that the pt's diet be upgraded to a regular consistency. Further skilled SLP services are not clinically indicated at this time for swallowing.    HPI HPI: 49 year old male, 1ppd smoker, admitted 3/10 s/p VF/VT cardaic arrest in setting of anterior STEMI. Underwent 45-50 minutes of CPR before ROSC.  LHC with 99% proximal-LAD occlusion s/p PCI with 3.5 x 18 Onyx DES. Right heart cath demonstrated mildly elevated filling pressures (RA 12, PCW 23) and preserve cardiac output with MVO2 67%.   Intubated from 3/10-3/13 (self extubated) reintubated 3/14-3/15.       SLP Plan  Discharge SLP treatment due to (comment);All goals met       Recommendations  Diet recommendations: Regular;Thin liquid Liquids provided via: Cup;Straw Medication Administration: Whole meds with liquid Supervision: Patient able to self feed Compensations: Slow rate Postural Changes and/or Swallow Maneuvers: Seated upright 90 degrees                Oral Care Recommendations: Oral care BID Follow up Recommendations: None SLP Visit Diagnosis: Dysphagia, oropharyngeal phase (R13.12) Plan: Discharge SLP treatment due to (comment);All  goals met       Neema Barreira I. Hardin Negus, [employer], [place] Office number [phone] Pager [place] 07/20/2019, 9:50 AM

## 2019-07-20 NOTE — Progress Notes (Signed)
CARDIAC REHAB PHASE I   PRE:  Rate/Rhythm: 90 Aflutter  BP:  Sitting: 94/68      SaO2: 94 RA  MODE:  Ambulation: 400 ft   POST:  Rate/Rhythm: 118 Aflutter  BP:  Sitting: 94/79    SaO2: 96 RA  Went to make a plan to walk with pt, and pt agreeable to walk now. Pt able to ambulate 459ft in hallway assist of one with rollator with steadier gait. Pt took one long sitting rest break halfway through c/o R sided chest pain, 7/10. Pt states pain worse with cough and inspiration. Pt sats maintained on room air, continues to look stronger. Provided support and encouragement to pt. Pt given IS, able to demonstrate ~625. Encouraged continued ambulation and IS use. Will continue to follow.  [id number] Reynold Bowen, RN BSN 07/20/2019 12:13 PM

## 2019-07-20 NOTE — Plan of Care (Signed)
  Problem: Education: Goal: Understanding of cardiac disease, CV risk reduction, and recovery process will improve Outcome: Progressing   Problem: Education: Goal: Understanding of medication regimen will improve Outcome: Progressing

## 2019-07-20 NOTE — Progress Notes (Signed)
Physical Therapy Treatment Patient Details Name: Edward Mendoza MRN: [id number] DOB: 1970/10/26 Today's Date: 07/20/2019    History of Present Illness Patient is a 49 y/o male who presents on 3/10 s/p cardiac arrest from Vfib/Vtach, down for 45 minutes. Admitted with STEMI s/p PCI to proximal LAD, self extubated 3/12, brief arrest 3/13 when putting in NG tube, re-intubated 3/13-3/15. Head CT 3/13-new wedge-shaped hypodensity involving the parasagittal right occipital lobe, suspicious for an evolving acute ischemic infarct, right PCA territory. PMH includes smoker.    PT Comments    Patient agrees to PT session. "I need my cadillac" referring to rollator. Prior PT notes do not reflect that patient used rollator. Requires min assist with bed mobility. Transfers with min guard/supervision. Cues for safety with rollator. Patient ambulated 150 feet with min guard then reported he needed to use bathroom. Assisted patient into bathroom, requires assist with pericare. Patient will continue to benefit from skilled PT while here to improve safety and and independence with mobility.     Follow Up Recommendations  Home health PT;Supervision/Assistance - 24 hour     Equipment Recommendations  Rolling walker with 5" wheels or rollator   Recommendations for Other Services       Precautions / Restrictions Precautions Precautions: Fall Precaution Comments: mod fall Restrictions Weight Bearing Restrictions: No RUE Weight Bearing: Weight bearing as tolerated LUE Weight Bearing: Weight bearing as tolerated RLE Weight Bearing: Weight bearing as tolerated    Mobility  Bed Mobility Overal bed mobility: Needs Assistance Bed Mobility: Supine to Sit;Sit to Supine     Supine to sit: Min assist;HOB elevated Sit to supine: Min assist   General bed mobility comments: reaches out for son's assist during bed mobility  Transfers Overall transfer level: Needs assistance Equipment used: 4-wheeled  walker Transfers: Sit to/from Stand Sit to Stand: Min guard         General transfer comment: Cues for hand placement  Ambulation/Gait Ambulation/Gait assistance: Min guard Gait Distance (Feet): 150 Feet Assistive device: 4-wheeled walker Gait Pattern/deviations: Step-through pattern;Drifts right/left Gait velocity: WFL   General Gait Details: min guard, difficulty with turning, backing up at times.   Stairs             Wheelchair Mobility    Modified Rankin (Stroke Patients Only) Modified Rankin (Stroke Patients Only) Pre-Morbid Rankin Score: No symptoms Modified Rankin: Moderately severe disability     Balance Overall balance assessment: Needs assistance Sitting-balance support: Feet supported Sitting balance-Leahy Scale: Fair Sitting balance - Comments: patient able to sit on commode with intermittent supervision   Standing balance support: Bilateral upper extremity supported;During functional activity Standing balance-Leahy Scale: Fair Standing balance comment: reliant on external support                            Cognition Arousal/Alertness: Awake/alert Behavior During Therapy: WFL for tasks assessed/performed Overall Cognitive Status: Within Functional Limits for tasks assessed Area of Impairment: Orientation;Safety/judgement;Awareness;Problem solving                 Orientation Level: Time;Disoriented to Current Attention Level: Sustained Memory: Decreased short-term memory Following Commands: Follows one step commands with increased time;Follows multi-step commands inconsistently Safety/Judgement: Decreased awareness of safety;Decreased awareness of deficits Awareness: Intellectual Problem Solving: Slow processing;Decreased initiation;Difficulty sequencing;Requires verbal cues;Requires tactile cues General Comments: Pt wife and son report pt cognition at baseline      Exercises      General Comments  Pertinent  Vitals/Pain Pain Assessment: Faces Faces Pain Scale: Hurts whole lot Pain Location: chest Pain Descriptors / Indicators: Sore;Guarding;Grimacing Pain Intervention(s): Monitored during session;Patient requesting pain meds-RN notified    Home Living                      Prior Function            PT Goals (current goals can now be found in the care plan section) Acute Rehab PT Goals Patient Stated Goal: go home PT Goal Formulation: With patient Time For Goal Achievement: 07/31/19 Potential to Achieve Goals: Good Progress towards PT goals: Progressing toward goals    Frequency    Min 4X/week      PT Plan Current plan remains appropriate    Co-evaluation              AM-PAC PT "6 Clicks" Mobility   Outcome Measure  Help needed turning from your back to your side while in a flat bed without using bedrails?: None Help needed moving from lying on your back to sitting on the side of a flat bed without using bedrails?: A Little Help needed moving to and from a bed to a chair (including a wheelchair)?: A Little Help needed standing up from a chair using your arms (e.g., wheelchair or bedside chair)?: A Little Help needed to walk in hospital room?: A Little Help needed climbing 3-5 steps with a railing? : A Little 6 Click Score: 19    End of Session Equipment Utilized During Treatment: Gait belt Activity Tolerance: Patient tolerated treatment well Patient left: in bed;with call bell/phone within reach;with family/visitor present Nurse Communication: Mobility status;Patient requests pain meds PT Visit Diagnosis: Unsteadiness on feet (R26.81);Other abnormalities of gait and mobility (R26.89);Pain Pain - part of body: (chest)     Time: 1445-1520 PT Time Calculation (min) (ACUTE ONLY): 35 min  Charges:  $Gait Training: 8-22 mins $Therapeutic Activity: 8-22 mins                     Kemontae Dunklee, PT, GCS 07/20/19,3:33 PM

## 2019-07-20 NOTE — H&P (View-Only) (Signed)
Advanced Heart Failure Rounding Note  PCP-Cardiologist: No primary care provider on file.   Subjective:    Events:  3/10 admitted with anterior STEMI and VF arrest with prolonged resuscitation s/p PCI LAD 3/12 self extubated. Developed recurrent respiratory failure and reintubated 3/15 Extubated Transferred out of CCU 3/17   Remains on amio drip for A flutter. V-rates low 100s.   WBC trending up, 12.2>>16.8>>17. PCT 0.24. AF. Denies fever/chills. No dyspnea or dysuria. CXR today shows rt basilar airspace disease, ? atelectasis versus pneumonia. Currently on abx w/ cefepime.   No cardiac symptoms. Denies CP and dyspnea.    Objective:   Weight Range: 94.4 kg Body mass index is 27.46 kg/m.   Vital Signs:   Temp:  [98.1 F (36.7 C)-99 F (37.2 C)] 98.6 F (37 C) (03/19 0805) Pulse Rate:  [81-104] 104 (03/19 0805) Resp:  [18-22] 20 (03/19 0805) BP: (98-109)/(67-79) 109/77 (03/19 0805) SpO2:  [92 %-96 %] 95 % (03/19 0805) Weight:  [94.4 kg] 94.4 kg (03/19 0456) Last BM Date: 07/19/19  Weight change: Filed Weights   07/18/19 0600 07/19/19 0500 07/20/19 0456  Weight: 96.4 kg 98.9 kg 94.4 kg    Intake/Output:   Intake/Output Summary (Last 24 hours) at 07/20/2019 0941 Last data filed at 07/19/2019 2300 Gross per 24 hour  Intake --  Output 800 ml  Net -800 ml      Physical Exam   PHYSICAL EXAM: General:  Middle aged WM, cantankerous. No respiratory difficulty HEENT: normal anicteric  Neck: supple. JVP 5-6 Carotids 2+ bilat; no bruits. No lymphadenopathy or thyromegaly appreciated. Cor: PMI nondisplaced. Irregular rhythm, tachy rate. No rubs, gallops or murmurs. Lungs: faint crackles RLL no wheeze  Abdomen: soft, nontender, nondistended. No hepatosplenomegaly. No bruits or masses. Good bowel sounds. Extremities: no cyanosis, clubbing, rash, edema +RUE PICC Neuro: alert & oriented x 3, cranial nerves grossly intact. moves all 4 extremities w/o difficulty.  Affect pleasant    Telemetry   Atrial Flutter 100-110 Personally reviewed   Labs    CBC Recent Labs    07/19/19 0257 07/19/19 0824  WBC 16.8* 17.1*  HGB 12.6* 12.8*  HCT 37.5* 37.7*  MCV 89.9 91.3  PLT 239 086   Basic Metabolic Panel Recent Labs    07/19/19 0257 07/20/19 0338  NA 135 135  K 4.2 4.0  CL 103 103  CO2 23 21*  GLUCOSE 110* 119*  BUN 41* 37*  CREATININE 1.14 1.09  CALCIUM 8.8* 8.7*   Liver Function Tests Recent Labs    07/18/19 0427 07/19/19 0257  AST 86* 89*  ALT 92* 109*  ALKPHOS 125 138*  BILITOT 1.0 0.9  PROT 6.3* 6.5  ALBUMIN 2.2* 2.3*   No results for input(s): LIPASE, AMYLASE in the last 72 hours. Cardiac Enzymes No results for input(s): CKTOTAL, CKMB, CKMBINDEX, TROPONINI in the last 72 hours.  BNP: BNP (last 3 results) Recent Labs    07/11/19 0533  BNP 23.3    ProBNP (last 3 results) No results for input(s): PROBNP in the last 8760 hours.   D-Dimer No results for input(s): DDIMER in the last 72 hours. Hemoglobin A1C No results for input(s): HGBA1C in the last 72 hours. Fasting Lipid Panel No results for input(s): CHOL, HDL, LDLCALC, TRIG, CHOLHDL, LDLDIRECT in the last 72 hours. Thyroid Function Tests No results for input(s): TSH, T4TOTAL, T3FREE, THYROIDAB in the last 72 hours.  Invalid input(s): FREET3  Other results:   Imaging    No results  found.   Medications:     Scheduled Medications: . apixaban  5 mg Oral BID  . aspirin  81 mg Oral Daily  . chlorhexidine gluconate (MEDLINE KIT)  15 mL Mouth Rinse BID  . Chlorhexidine Gluconate Cloth  6 each Topical Daily  . clopidogrel  75 mg Oral Daily  . digoxin  0.125 mg Per Tube Daily  . feeding supplement (ENSURE ENLIVE)  237 mL Oral TID BM  . furosemide  20 mg Oral Daily  . insulin aspart  0-9 Units Subcutaneous Q4H  . multivitamin with minerals  1 tablet Oral Daily  . pantoprazole  40 mg Oral Daily  . potassium chloride  20 mEq Oral BID  . sodium  chloride flush  10-40 mL Intracatheter Q12H  . sodium chloride flush  10-40 mL Intracatheter Q12H  . spironolactone  25 mg Oral Daily    Infusions: . sodium chloride 10 mL/hr at 07/18/19 0800  . amiodarone 30 mg/hr (07/20/19 0000)  . ceFEPime (MAXIPIME) IV 2 g (07/20/19 3888)    PRN Medications: acetaminophen (TYLENOL) oral liquid 160 mg/5 mL, sodium chloride flush, sodium chloride flush, traMADol    Assessment/Plan   1. Witnessed Out of Hospital VF Arrest in setting of anterior STEMI  - Witnessed by EMS w/ prompt initiation of ACLS - Estimated 40-50 min resucitation time - Secondary to acute anterior STEMI w/ proximal LAD occlusion, s/p PCI  - EF 15% - s/p hypothermia  - Cognitively intact   2. Anterior STEMI - LHC showed 99% proximal LAD thrombotic occlusion, s/p PCI + DES placement. No other significant disease - EF 15%.  - Continue DAPT w/ ASA + plavix due to addition of eliquis.  - no statin currently w/ elevated LFTs  ( in a flutter) - no ? blocker w/ low output   3. Acute systolic HF -> Cardiogenic Shock  - initial echo 07/11/19 EF 15%. RV systolic function mildly reduced  - f/u echo on 3/13 LVEF 20-25% - Discussed w/ Dr. Haroldine Laws today, will give dose of IV Lasix 40 mg x 1 today  - Continue digoxin 0.125 mcg - Continue spironolactone 25 mg daily  - BP too soft for ARB/ARNI - Renal function stable.    4. Acute hypoxic respiratory failure. -In setting of VF arrest. -Self extubated 3/12.  Reintubated 3/12 due to recurrent respiratory failure Extubated 3/15. Sats stable on room air.   -on abx for suspected PNA. WBC up to 17. PCT 24. CXR today rt basilar airspace disease, - Continue cefipime. - Trach aspirate NGTD   5. Post MI Atrial Fibrillation/A flutter  - Amio stopped 07/16/19 but restarted with a flutter.  - on amiodarone gtt. Rates 90s-low 100s - Continue eliquis 5 mg twice a day. See above.  - Will need TEE/DCCV prior to d/c if no chemical conversion  . Scheduled 3/22   5. Shock Liver:  - 2/2 Acute MI/ cardiogenic shock  - Overall improved, however LFTs starting to trend back up slightly. - off statin. On amiodarone.  - Monitor  6. AKI:  - improved, SCr 1.09 today    Continue to mobilize w/ PT/ CR   Length of Stay: [address], PA-C  07/20/2019, 9:41 AM  Advanced Heart Failure Team Pager [phone] (M-F; 7a - 4p)  Please contact [facility] for night-coverage after hours (4p -7a ) and weekends on [email or web address]  Patient seen and examined with the above-signed Advanced Practice Provider and/or Housestaff. I personally reviewed laboratory data, imaging studies  and relevant notes. I independently examined the patient and formulated the important aspects of the plan. I have edited the note to reflect any of my changes or salient points. I have personally discussed the plan with the patient and/or family.  Continues to improve slowly. Mental status clearing. Remains in AFL with AVR despite IV amio. Co-ox 62% off milrinone.   Chest sore from CPR.   WBC rising. CXR with ? LLL infiltrate. PCT 0.27. Appears mildly volume overloaded. Would continue abx. Give one dose IV lasix.   On eliquis. No bleeding   Continue spiro and dig. Add low dose losartan.   We had planned TEE/DC-CV on Monday but he is adamant that he would like to go home sooner if possible.   I have d/w Anesthesia and Endo. Will try for TEE/DC-CV tomorrow if possible with d/c then on Sunday.  PT/OT.   Glori Bickers, MD  5:57 PM

## 2019-07-20 NOTE — Progress Notes (Addendum)
Advanced Heart Failure Rounding Note  PCP-Cardiologist: No primary care provider on file.   Subjective:    Events:  3/10 admitted with anterior STEMI and VF arrest with prolonged resuscitation s/p PCI LAD 3/12 self extubated. Developed recurrent respiratory failure and reintubated 3/15 Extubated Transferred out of CCU 3/17   Remains on amio drip for A flutter. V-rates low 100s.   WBC trending up, 12.2>>16.8>>17. PCT 0.24. AF. Denies fever/chills. No dyspnea or dysuria. CXR today shows rt basilar airspace disease, ? atelectasis versus pneumonia. Currently on abx w/ cefepime.   No cardiac symptoms. Denies CP and dyspnea.    Objective:   Weight Range: 94.4 kg Body mass index is 27.46 kg/m.   Vital Signs:   Temp:  [98.1 F (36.7 C)-99 F (37.2 C)] 98.6 F (37 C) (03/19 0805) Pulse Rate:  [81-104] 104 (03/19 0805) Resp:  [18-22] 20 (03/19 0805) BP: (98-109)/(67-79) 109/77 (03/19 0805) SpO2:  [92 %-96 %] 95 % (03/19 0805) Weight:  [94.4 kg] 94.4 kg (03/19 0456) Last BM Date: 07/19/19  Weight change: Filed Weights   07/18/19 0600 07/19/19 0500 07/20/19 0456  Weight: 96.4 kg 98.9 kg 94.4 kg    Intake/Output:   Intake/Output Summary (Last 24 hours) at 07/20/2019 0941 Last data filed at 07/19/2019 2300 Gross per 24 hour  Intake --  Output 800 ml  Net -800 ml      Physical Exam   PHYSICAL EXAM: General:  Middle aged WM, cantankerous. No respiratory difficulty HEENT: normal anicteric  Neck: supple. JVP 5-6 Carotids 2+ bilat; no bruits. No lymphadenopathy or thyromegaly appreciated. Cor: PMI nondisplaced. Irregular rhythm, tachy rate. No rubs, gallops or murmurs. Lungs: faint crackles RLL no wheeze  Abdomen: soft, nontender, nondistended. No hepatosplenomegaly. No bruits or masses. Good bowel sounds. Extremities: no cyanosis, clubbing, rash, edema +RUE PICC Neuro: alert & oriented x 3, cranial nerves grossly intact. moves all 4 extremities w/o difficulty.  Affect pleasant    Telemetry   Atrial Flutter 100-110 Personally reviewed   Labs    CBC Recent Labs    07/19/19 0257 07/19/19 0824  WBC 16.8* 17.1*  HGB 12.6* 12.8*  HCT 37.5* 37.7*  MCV 89.9 91.3  PLT 239 793   Basic Metabolic Panel Recent Labs    07/19/19 0257 07/20/19 0338  NA 135 135  K 4.2 4.0  CL 103 103  CO2 23 21*  GLUCOSE 110* 119*  BUN 41* 37*  CREATININE 1.14 1.09  CALCIUM 8.8* 8.7*   Liver Function Tests Recent Labs    07/18/19 0427 07/19/19 0257  AST 86* 89*  ALT 92* 109*  ALKPHOS 125 138*  BILITOT 1.0 0.9  PROT 6.3* 6.5  ALBUMIN 2.2* 2.3*   No results for input(s): LIPASE, AMYLASE in the last 72 hours. Cardiac Enzymes No results for input(s): CKTOTAL, CKMB, CKMBINDEX, TROPONINI in the last 72 hours.  BNP: BNP (last 3 results) Recent Labs    07/11/19 0533  BNP 23.3    ProBNP (last 3 results) No results for input(s): PROBNP in the last 8760 hours.   D-Dimer No results for input(s): DDIMER in the last 72 hours. Hemoglobin A1C No results for input(s): HGBA1C in the last 72 hours. Fasting Lipid Panel No results for input(s): CHOL, HDL, LDLCALC, TRIG, CHOLHDL, LDLDIRECT in the last 72 hours. Thyroid Function Tests No results for input(s): TSH, T4TOTAL, T3FREE, THYROIDAB in the last 72 hours.  Invalid input(s): FREET3  Other results:   Imaging    No results  found.   Medications:     Scheduled Medications: . apixaban  5 mg Oral BID  . aspirin  81 mg Oral Daily  . chlorhexidine gluconate (MEDLINE KIT)  15 mL Mouth Rinse BID  . Chlorhexidine Gluconate Cloth  6 each Topical Daily  . clopidogrel  75 mg Oral Daily  . digoxin  0.125 mg Per Tube Daily  . feeding supplement (ENSURE ENLIVE)  237 mL Oral TID BM  . furosemide  20 mg Oral Daily  . insulin aspart  0-9 Units Subcutaneous Q4H  . multivitamin with minerals  1 tablet Oral Daily  . pantoprazole  40 mg Oral Daily  . potassium chloride  20 mEq Oral BID  . sodium  chloride flush  10-40 mL Intracatheter Q12H  . sodium chloride flush  10-40 mL Intracatheter Q12H  . spironolactone  25 mg Oral Daily    Infusions: . sodium chloride 10 mL/hr at 07/18/19 0800  . amiodarone 30 mg/hr (07/20/19 0000)  . ceFEPime (MAXIPIME) IV 2 g (07/20/19 2956)    PRN Medications: acetaminophen (TYLENOL) oral liquid 160 mg/5 mL, sodium chloride flush, sodium chloride flush, traMADol    Assessment/Plan   1. Witnessed Out of Hospital VF Arrest in setting of anterior STEMI  - Witnessed by EMS w/ prompt initiation of ACLS - Estimated 40-50 min resucitation time - Secondary to acute anterior STEMI w/ proximal LAD occlusion, s/p PCI  - EF 15% - s/p hypothermia  - Cognitively intact   2. Anterior STEMI - LHC showed 99% proximal LAD thrombotic occlusion, s/p PCI + DES placement. No other significant disease - EF 15%.  - Continue DAPT w/ ASA + plavix due to addition of eliquis.  - no statin currently w/ elevated LFTs  ( in a flutter) - no ? blocker w/ low output   3. Acute systolic HF -> Cardiogenic Shock  - initial echo 07/11/19 EF 15%. RV systolic function mildly reduced  - f/u echo on 3/13 LVEF 20-25% - Discussed w/ Dr. Haroldine Laws today, will give dose of IV Lasix 40 mg x 1 today  - Continue digoxin 0.125 mcg - Continue spironolactone 25 mg daily  - BP too soft for ARB/ARNI - Renal function stable.    4. Acute hypoxic respiratory failure. -In setting of VF arrest. -Self extubated 3/12.  Reintubated 3/12 due to recurrent respiratory failure Extubated 3/15. Sats stable on room air.   -on abx for suspected PNA. WBC up to 17. PCT 24. CXR today rt basilar airspace disease, - Continue cefipime. - Trach aspirate NGTD   5. Post MI Atrial Fibrillation/A flutter  - Amio stopped 07/16/19 but restarted with a flutter.  - on amiodarone gtt. Rates 90s-low 100s - Continue eliquis 5 mg twice a day. See above.  - Will need TEE/DCCV prior to d/c if no chemical conversion  . Scheduled 3/22   5. Shock Liver:  - 2/2 Acute MI/ cardiogenic shock  - Overall improved, however LFTs starting to trend back up slightly. - off statin. On amiodarone.  - Monitor  6. AKI:  - improved, SCr 1.09 today    Continue to mobilize w/ PT/ CR   Length of Stay: [address] PA-C  07/20/2019, 9:41 AM  Advanced Heart Failure Team Pager [phone] (M-F; 7a - 4p)  Please contact [facility] for night-coverage after hours (4p -7a ) and weekends on [email or web address]  Patient seen and examined with the above-signed Advanced Practice Provider and/or Housestaff. I personally reviewed laboratory data, imaging studies  and relevant notes. I independently examined the patient and formulated the important aspects of the plan. I have edited the note to reflect any of my changes or salient points. I have personally discussed the plan with the patient and/or family.  Continues to improve slowly. Mental status clearing. Remains in AFL with AVR despite IV amio. Co-ox 62% off milrinone.   Chest sore from CPR.   WBC rising. CXR with ? LLL infiltrate. PCT 0.27. Appears mildly volume overloaded. Would continue abx. Give one dose IV lasix.   On eliquis. No bleeding   Continue spiro and dig. Add low dose losartan.   We had planned TEE/DC-CV on Monday but he is adamant that he would like to go home sooner if possible.   I have d/w Anesthesia and Endo. Will try for TEE/DC-CV tomorrow if possible with d/c then on Sunday.  PT/OT.   Glori Bickers, MD  5:57 PM

## 2019-07-20 NOTE — Progress Notes (Signed)
OT Cancellation Note  Patient Details Name: JEMELL TOWN MRN: [id number] DOB: 04/09/1971   Cancelled Treatment:    Reason Eval/Treat Not Completed: Fatigue/lethargy limiting ability to participate(pt refusing OOB at this time). Educated pt and wife in benefits. Will continue to follow.  Evern Bio 07/20/2019, 11:24 AM  Martie Round, OTR/L Acute Rehabilitation Services Pager: [phone] Office: [phone]

## 2019-07-21 ENCOUNTER — Inpatient Hospital Stay (HOSPITAL_COMMUNITY): Payer: Managed Care, Other (non HMO) | Admitting: Certified Registered Nurse Anesthetist

## 2019-07-21 ENCOUNTER — Encounter (HOSPITAL_COMMUNITY)
Admission: EM | Disposition: A | Discharge status: Home / Self Care | Payer: Self-pay | Source: Other Acute Inpatient Hospital | Attending: Cardiology

## 2019-07-21 ENCOUNTER — Inpatient Hospital Stay (HOSPITAL_COMMUNITY): Payer: Managed Care, Other (non HMO)

## 2019-07-21 ENCOUNTER — Encounter (HOSPITAL_COMMUNITY): Payer: Self-pay | Admitting: Cardiology

## 2019-07-21 DIAGNOSIS — I4892 Unspecified atrial flutter: Secondary | ICD-10-CM

## 2019-07-21 DIAGNOSIS — Q211 Atrial septal defect: Secondary | ICD-10-CM

## 2019-07-21 HISTORY — PX: CARDIOVERSION: SHX1299

## 2019-07-21 HISTORY — PX: TEE WITHOUT CARDIOVERSION: SHX5443

## 2019-07-21 LAB — COOXEMETRY PANEL
Carboxyhemoglobin: 2.4 % — ABNORMAL HIGH (ref 0.5–1.5)
Methemoglobin: 1.2 % (ref 0.0–1.5)
O2 Saturation: 68.7 %
Total hemoglobin: 11.7 g/dL — ABNORMAL LOW (ref 12.0–16.0)

## 2019-07-21 LAB — COMPREHENSIVE METABOLIC PANEL
ALT: 203 U/L — ABNORMAL HIGH (ref 0–44)
AST: 123 U/L — ABNORMAL HIGH (ref 15–41)
Albumin: 2.2 g/dL — ABNORMAL LOW (ref 3.5–5.0)
Alkaline Phosphatase: 186 U/L — ABNORMAL HIGH (ref 38–126)
Anion gap: 12 (ref 5–15)
BUN: 34 mg/dL — ABNORMAL HIGH (ref 6–20)
CO2: 20 mmol/L — ABNORMAL LOW (ref 22–32)
Calcium: 8.6 mg/dL — ABNORMAL LOW (ref 8.9–10.3)
Chloride: 101 mmol/L (ref 98–111)
Creatinine, Ser: 1.17 mg/dL (ref 0.61–1.24)
GFR calc Af Amer: >60 mL/min (ref >60)
GFR calc non Af Amer: >60 mL/min (ref >60)
Glucose, Bld: 110 mg/dL — ABNORMAL HIGH (ref 70–99)
Potassium: 4.2 mmol/L (ref 3.5–5.1)
Sodium: 133 mmol/L — ABNORMAL LOW (ref 135–145)
Total Bilirubin: 1.5 mg/dL — ABNORMAL HIGH (ref 0.3–1.2)
Total Protein: 6.9 g/dL (ref 6.5–8.1)

## 2019-07-21 LAB — GLUCOSE, CAPILLARY
Glucose-Capillary: 110 mg/dL — ABNORMAL HIGH (ref 70–99)
Glucose-Capillary: 97 mg/dL (ref 70–99)
Glucose-Capillary: 114 mg/dL — ABNORMAL HIGH (ref 70–99)
Glucose-Capillary: 114 mg/dL — ABNORMAL HIGH (ref 70–99)

## 2019-07-21 LAB — PROCALCITONIN: Procalcitonin: 0.21 ng/mL

## 2019-07-21 SURGERY — CARDIOVERSION
Anesthesia: Monitor Anesthesia Care

## 2019-07-21 SURGERY — ECHOCARDIOGRAM, TRANSESOPHAGEAL
Anesthesia: Monitor Anesthesia Care

## 2019-07-21 MED ORDER — AMIODARONE HCL 200 MG PO TABS
200.0000 mg | ORAL_TABLET | Freq: Every day | ORAL | Status: DC
  Administered 2019-07-21 – 2019-07-22 (×2): 200 mg via ORAL
  Filled 2019-07-21 (×2): qty 1

## 2019-07-21 MED ORDER — PHENYLEPHRINE 40 MCG/ML (10ML) SYRINGE FOR IV PUSH (FOR BLOOD PRESSURE SUPPORT)
PREFILLED_SYRINGE | INTRAVENOUS | Status: DC | PRN
  Administered 2019-07-21 (×2): 80 ug via INTRAVENOUS

## 2019-07-21 MED ORDER — PROPOFOL 10 MG/ML IV BOLUS
INTRAVENOUS | Status: DC | PRN
  Administered 2019-07-21: 20 mg via INTRAVENOUS
  Administered 2019-07-21 (×4): 10 mg via INTRAVENOUS

## 2019-07-21 MED ORDER — PROPOFOL 500 MG/50ML IV EMUL
INTRAVENOUS | Status: DC | PRN
  Administered 2019-07-21: 100 ug/kg/min via INTRAVENOUS

## 2019-07-21 NOTE — Addendum Note (Signed)
Addendum  created 07/21/19 1647 by Leonides Grills, MD   Clinical Note Signed

## 2019-07-21 NOTE — Progress Notes (Signed)
Report received from Nedra Hai, RN Night.  Pt awake alert and oriented x 4.  Transferred to Endo lab via wheel chair for TEE at 0750.  Received patient from Endo at 0920.  AA& O x 4.  C/o mild soreness in chest.  Placed on cardiac monitor, VVS.

## 2019-07-21 NOTE — Anesthesia Postprocedure Evaluation (Signed)
Anesthesia Post Note  Patient: Edward Mendoza  Procedure(s) Performed: TRANSESOPHAGEAL ECHOCARDIOGRAM (TEE) (N/A ) CARDIOVERSION (N/A )     Patient location during evaluation: Endoscopy Anesthesia Type: MAC Level of consciousness: awake and alert Pain management: pain level controlled Vital Signs Assessment: post-procedure vital signs reviewed and stable Respiratory status: spontaneous breathing, nonlabored ventilation, respiratory function stable and patient connected to nasal cannula oxygen Cardiovascular status: stable and blood pressure returned to baseline Postop Assessment: no apparent nausea or vomiting Anesthetic complications: no    Last Vitals:  Vitals:   07/21/19 1133 07/21/19 1621  BP: 93/66 98/63  Pulse: 96 97  Resp: 19 18  Temp: 37 C 36.9 C  SpO2: 96% 97%    Last Pain:  Vitals:   07/21/19 1621  TempSrc: Oral  PainSc:                  Elmon Shader P Reginold Beale

## 2019-07-21 NOTE — Anesthesia Preprocedure Evaluation (Signed)
Anesthesia Evaluation  Patient identified by MRN, date of birth, ID band Patient awake    Reviewed: Allergy & Precautions, NPO status , Patient's Chart, lab work & pertinent test results  Airway Mallampati: III  TM Distance: >3 FB Neck ROM: Full    Dental no notable dental hx.    Pulmonary Current Smoker and Patient abstained from smoking.,    Pulmonary exam normal breath sounds clear to auscultation       Cardiovascular + CAD and + Past MI  + dysrhythmias Atrial Fibrillation  Rhythm:Regular Rate:Tachycardia  ECG: rate 104   Neuro/Psych negative neurological ROS  negative psych ROS   GI/Hepatic negative GI ROS, Neg liver ROS,   Endo/Other  negative endocrine ROS  Renal/GU negative Renal ROS     Musculoskeletal negative musculoskeletal ROS (+)   Abdominal   Peds  Hematology  (+) anemia ,   Anesthesia Other Findings A-FLUTTER  Reproductive/Obstetrics                             Anesthesia Physical Anesthesia Plan  ASA: IV  Anesthesia Plan: MAC   Post-op Pain Management:    Induction: Intravenous  PONV Risk Score and Plan: 0 and Propofol infusion and Treatment may vary due to age or medical condition  Airway Management Planned: Nasal Cannula  Additional Equipment:   Intra-op Plan:   Post-operative Plan:   Informed Consent: I have reviewed the patients History and Physical, chart, labs and discussed the procedure including the risks, benefits and alternatives for the proposed anesthesia with the patient or authorized representative who has indicated his/her understanding and acceptance.     Dental advisory given  Plan Discussed with: CRNA  Anesthesia Plan Comments: (Amiodarone drip)        Anesthesia Quick Evaluation

## 2019-07-21 NOTE — CV Procedure (Signed)
   TRANSESOPHAGEAL ECHOCARDIOGRAM GUIDED DIRECT CURRENT CARDIOVERSION  NAME:  Edward Mendoza   MRN: [id number] DOB:  10-23-1970   ADMIT DATE: 07/11/2019  INDICATIONS:  AFL  PROCEDURE:   Informed consent was obtained prior to the procedure. The risks, benefits and alternatives for the procedure were discussed and the patient comprehended these risks.  Risks include, but are not limited to, cough, sore throat, vomiting, nausea, somnolence, esophageal and stomach trauma or perforation, bleeding, low blood pressure, aspiration, pneumonia, infection, trauma to the teeth and death.    After a procedural time-out, the oropharynx was anesthetized and the patient was sedated by the anesthesia service. The transesophageal probe was inserted in the esophagus and stomach without difficulty and multiple views were obtained.   FINDINGS:  LEFT VENTRICLE: EF = 30-35% with akinesis of mid to distal anterior wall and apex  RIGHT VENTRICLE: Normal  LEFT ATRIUM: Moderately dilated   LEFT ATRIAL APPENDAGE: No clot  RIGHT ATRIUM: Normal  AORTIC VALVE:  [place].  No AI/AS  MITRAL VALVE:    Normal trivial MR  TRICUSPID VALVE: Normal mild TR  PULMONIC VALVE: Normal  INTERATRIAL SEPTUM: IAS aneurysm + PFO  PERICARDIUM: Small anterior effusion  DESCENDING AORTA: Moderate to severe plaque   CARDIOVERSION:     Indications:  Atrial Flutter  Procedure Details:  Once the TEE was complete, the patient had the defibrillator pads placed in the anterior and posterior position. Once an appropriate level of sedation was achieved, the patient received a single biphasic, synchronized 150J shock with prompt conversion to sinus rhythm. No apparent complications.   Arvilla Meres, MD  9:09 AM

## 2019-07-21 NOTE — Progress Notes (Signed)
[id number] Pt standing with family when I entered room. MI and CHF education completed with pt and wife who voiced understanding. Stressed importance of plavix with stent. Reviewed NTG use, MI restrictions, smoking cessation, heart healthy and low sodium food choices, walking for ex, and CRP 2. Gave CHF booklet due to low EF and discussed signs/symptoms of CHF, 2000 mg sodium restriction, and importance of daily weights. Gave smoking cessation handout and encouraged to call 1800quitnow if needed. Pt plans to quit now. Discussed CRP 2 for future goal and will send letter to [facility]. Pt stated he is getting HH services and equipment for home. Recommendations in PT note. Pt smiling and glad to be going home soon. Did not want to walk at this time. Encouraged him to walk later with staff. Luetta Nutting RN BSN 07/21/2019 2:16 PM

## 2019-07-21 NOTE — Anesthesia Postprocedure Evaluation (Signed)
Anesthesia Post Note  Patient: Edward Mendoza  Procedure(s) Performed: TRANSESOPHAGEAL ECHOCARDIOGRAM (TEE) (N/A ) CARDIOVERSION (N/A )     Patient location during evaluation: Endoscopy Anesthesia Type: MAC and General Level of consciousness: awake and alert Pain management: pain level controlled Vital Signs Assessment: post-procedure vital signs reviewed and stable Respiratory status: spontaneous breathing, nonlabored ventilation, respiratory function stable and patient connected to nasal cannula oxygen Cardiovascular status: blood pressure returned to baseline and stable Postop Assessment: no apparent nausea or vomiting Anesthetic complications: no    Last Vitals:  Vitals:   07/21/19 1133 07/21/19 1621  BP: 93/66 98/63  Pulse: 96 97  Resp: 19 18  Temp: 37 C 36.9 C  SpO2: 96% 97%    Last Pain:  Vitals:   07/21/19 1621  TempSrc: Oral  PainSc:                  Ivry Pigue P Jairon Ripberger

## 2019-07-21 NOTE — Transfer of Care (Signed)
Immediate Anesthesia Transfer of Care Note  Patient: Edward Mendoza  Procedure(s) Performed: TRANSESOPHAGEAL ECHOCARDIOGRAM (TEE) (N/A ) CARDIOVERSION (N/A )  Patient Location: Endoscopy Unit  Anesthesia Type:MAC  Level of Consciousness: awake, alert  and oriented  Airway & Oxygen Therapy: Patient Spontanous Breathing  Post-op Assessment: Report given to RN and Post -op Vital signs reviewed and stable  Post vital signs: Reviewed and stable  Last Vitals:  Vitals Value Taken Time  BP 80/55 07/21/19 0836  Temp    Pulse 93 07/21/19 0837  Resp 19 07/21/19 0837  SpO2 94 % 07/21/19 0837  Vitals shown include unvalidated device data.  Last Pain:  Vitals:   07/21/19 0750  TempSrc: Temporal  PainSc: 0-No pain         Complications: No apparent anesthesia complications

## 2019-07-21 NOTE — Progress Notes (Addendum)
Advanced Heart Failure Rounding Note  PCP-Cardiologist: No primary care provider on file.   Subjective:    Events:  3/10 admitted with anterior STEMI and VF arrest with prolonged resuscitation s/p PCI LAD 3/12 self extubated. Developed recurrent respiratory failure and reintubated 3/15 Extubated Transferred out of CCU 3/17   Remains on amio drip for A flutter. V-rates 90- lows100s. Co-ox 69%  Says he didn't sleep last night but still feels awesome. Chest soreness from CPR improved. Cough improved. No SOB   Good diuresis last night. Weight down another 3 pounds with IV lasix   Objective:   Weight Range: 93.3 kg Body mass index is 27.14 kg/m.   Vital Signs:   Temp:  [99.1 F (37.3 C)-100.7 F (38.2 C)] 99.1 F (37.3 C) (03/20 0750) Pulse Rate:  [74-105] 105 (03/20 0750) Resp:  [18-25] 25 (03/20 0750) BP: (94-110)/(65-79) 101/65 (03/20 0750) SpO2:  [93 %-95 %] 95 % (03/20 0750) FiO2 (%):  [21 %-28 %] 21 % (03/19 2300) Weight:  [93.3 kg] 93.3 kg (03/20 0423) Last BM Date: 07/19/19  Weight change: Filed Weights   07/19/19 0500 07/20/19 0456 07/21/19 0423  Weight: 98.9 kg 94.4 kg 93.3 kg    Intake/Output:   Intake/Output Summary (Last 24 hours) at 07/21/2019 0808 Last data filed at 07/21/2019 0600 Gross per 24 hour  Intake 2562.44 ml  Output 1905 ml  Net 657.44 ml      Physical Exam   General:  Well appearing. No resp difficulty HEENT: normal Neck: supple. no JVD. Carotids 2+ bilat; no bruits. No lymphadenopathy or thryomegaly appreciated. Cor: PMI nondisplaced. Irregular rate & rhythm. No rubs, gallops or murmurs. Lungs: clear Abdomen: soft, nontender, nondistended. No hepatosplenomegaly. No bruits or masses. Good bowel sounds. Extremities: no cyanosis, clubbing, rash, edema Neuro: alert & orientedx3, cranial nerves grossly intact. moves all 4 extremities w/o difficulty. Affect pleasant but still tearful at times    Telemetry   Atrial Flutter  90-105  Personally reviewed   Labs    CBC Recent Labs    07/19/19 0257 07/19/19 0824  WBC 16.8* 17.1*  HGB 12.6* 12.8*  HCT 37.5* 37.7*  MCV 89.9 91.3  PLT 239 242   Basic Metabolic Panel Recent Labs    07/20/19 0338 07/21/19 0432  NA 135 133*  K 4.0 4.2  CL 103 101  CO2 21* 20*  GLUCOSE 119* 110*  BUN 37* 34*  CREATININE 1.09 1.17  CALCIUM 8.7* 8.6*   Liver Function Tests Recent Labs    07/19/19 0257 07/21/19 0432  AST 89* 123*  ALT 109* 203*  ALKPHOS 138* 186*  BILITOT 0.9 1.5*  PROT 6.5 6.9  ALBUMIN 2.3* 2.2*   No results for input(s): LIPASE, AMYLASE in the last 72 hours. Cardiac Enzymes No results for input(s): CKTOTAL, CKMB, CKMBINDEX, TROPONINI in the last 72 hours.  BNP: BNP (last 3 results) Recent Labs    07/11/19 0533  BNP 23.3    ProBNP (last 3 results) No results for input(s): PROBNP in the last 8760 hours.   D-Dimer No results for input(s): DDIMER in the last 72 hours. Hemoglobin A1C No results for input(s): HGBA1C in the last 72 hours. Fasting Lipid Panel No results for input(s): CHOL, HDL, LDLCALC, TRIG, CHOLHDL, LDLDIRECT in the last 72 hours. Thyroid Function Tests No results for input(s): TSH, T4TOTAL, T3FREE, THYROIDAB in the last 72 hours.  Invalid input(s): FREET3  Other results:   Imaging    DG Chest 2 View  Result Date: 07/20/2019 CLINICAL DATA:  Right lower chest pain EXAM: CHEST - 2 VIEW COMPARISON:  07/17/2019 FINDINGS: There is a right-sided PICC line with the tip projecting over the cavoatrial junction. There is hazy right basilar airspace disease which may reflect atelectasis versus pneumonia. There is no pleural effusion or pneumothorax. The heart and mediastinal contours are unremarkable. There is no acute osseous abnormality. IMPRESSION: Hazy right basilar airspace disease which may reflect atelectasis versus pneumonia. Electronically Signed   By: Kathreen Devoid   On: 07/20/2019 10:07     Medications:      Scheduled Medications: . [MAR Hold] apixaban  5 mg Oral BID  . [MAR Hold] aspirin  81 mg Oral Daily  . [MAR Hold] chlorhexidine gluconate (MEDLINE KIT)  15 mL Mouth Rinse BID  . [MAR Hold] Chlorhexidine Gluconate Cloth  6 each Topical Daily  . [MAR Hold] clopidogrel  75 mg Oral Daily  . [MAR Hold] digoxin  0.125 mg Per Tube Daily  . [MAR Hold] feeding supplement (ENSURE ENLIVE)  237 mL Oral TID BM  . [MAR Hold] furosemide  20 mg Oral Daily  . [MAR Hold] insulin aspart  0-9 Units Subcutaneous TID AC & HS  . [MAR Hold] losartan  25 mg Oral QHS  . [MAR Hold] multivitamin with minerals  1 tablet Oral Daily  . [MAR Hold] pantoprazole  40 mg Oral Daily  . [MAR Hold] potassium chloride  20 mEq Oral BID  . [MAR Hold] sodium chloride flush  10-40 mL Intracatheter Q12H  . [MAR Hold] sodium chloride flush  10-40 mL Intracatheter Q12H  . [MAR Hold] spironolactone  25 mg Oral Daily    Infusions: . sodium chloride Stopped (07/18/19 1430)  . sodium chloride    . sodium chloride 20 mL/hr at 07/21/19 0752  . amiodarone 30 mg/hr (07/21/19 0037)    PRN Medications: [MAR Hold] acetaminophen (TYLENOL) oral liquid 160 mg/5 mL, [MAR Hold] sodium chloride flush, [MAR Hold] sodium chloride flush, [MAR Hold] traMADol    Assessment/Plan   1. Witnessed Out of Hospital VF Arrest in setting of anterior STEMI  - Witnessed by EMS w/ prompt initiation of ACLS - Estimated 40-50 min resucitation time - Secondary to acute anterior STEMI w/ proximal LAD occlusion, s/p PCI  - EF 15% - s/p hypothermia  - Improved. Cognitively intact   2. Anterior STEMI - LHC showed 99% proximal LAD thrombotic occlusion, s/p PCI + DES placement. No other significant disease - EF 15%. -> re-evaluate on TEE today - Continue DAPT w/ ASA + plavix due to addition of eliquis.  - Off statin due to elevated LFTs which are trending back up (?amio) - No b-blocker yet with shock initially and need for amio and DC-CV.  - can  re-address statin tomorrow   3. Acute systolic HF -> Cardiogenic Shock  - initial echo 07/11/19 EF 15%. RV systolic function mildly reduced  - f/u echo on 3/13 LVEF 20-25% - Volume status improved with IV lasix - Start lasix 20 daily po in am - Continue digoxin 0.125 mcg - Continue spironolactone 25 mg daily  - Continue losartan as BP tolerates  - No b-blocker yet - Add Farxiga soon  - Renal function stable.    4. Acute hypoxic respiratory failure. -In setting of VF arrest. -Self extubated 3/12.  Reintubated 3/12 due to recurrent respiratory failure Extubated 3/15. Sats stable on room air.   -on abx for suspected PNA.  PCT 24 -> 0.21 - Completes cefipime. -  Trach aspirate NGTD - Will repeat CXR and WBC ina m    5. Post MI Atrial Fibrillation/A flutter  - Amio stopped 07/16/19 but restarted with a flutter.  - on amiodarone gtt. Rates 90s-low 100s - Continue eliquis 5 mg twice a day. See above.  - TEE/DC-CV today  6. Shock Liver:  - 2/2 Acute MI/ cardiogenic shock  - Overall improved, however LFTs continue to trend back off - possible cefepime vs amio affect  - off statin. On amiodarone.  - Monitor - check hep panels. - switch amio to 200 po. Stop cefipime - if LFTs contrinue to trend up may need liver u/s and have to stop amio   7. AKI:  - resolved  8. Multiple strokes on MRI - likely cardio embolic from CPR/arrest - start statin when able - continue Eliquis  9. Depression - start Celexa for mood stability   Continue to mobilize w/ PT/ CR   Length of Stay: [place], MD  07/21/2019, 8:08 AM  Advanced Heart Failure Team Pager [phone] (M-F; 7a - 4p)  Please contact [facility] for night-coverage after hours (4p -7a ) and weekends on [email or web address]

## 2019-07-21 NOTE — Progress Notes (Signed)
  Echocardiogram Echocardiogram Transesophageal has been performed.  Gerda Diss 07/21/2019, 8:57 AM

## 2019-07-21 NOTE — Interval H&P Note (Signed)
History and Physical Interval Note:  07/21/2019 8:07 AM  Edward Mendoza  has presented today for surgery, with the diagnosis of AFLUTTER.  The various methods of treatment have been discussed with the patient and family. After consideration of risks, benefits and other options for treatment, the patient has consented to  Procedure(s): TRANSESOPHAGEAL ECHOCARDIOGRAM (TEE) (N/A) CARDIOVERSION (N/A) as a surgical intervention.  The patient's history has been reviewed, patient examined, no change in status, stable for surgery.  I have reviewed the patient's chart and labs.  Questions were answered to the patient's satisfaction.     Kajsa Butrum

## 2019-07-22 DIAGNOSIS — G9340 Encephalopathy, unspecified: Secondary | ICD-10-CM

## 2019-07-22 DIAGNOSIS — R570 Cardiogenic shock: Secondary | ICD-10-CM

## 2019-07-22 DIAGNOSIS — I5021 Acute systolic (congestive) heart failure: Secondary | ICD-10-CM

## 2019-07-22 DIAGNOSIS — F329 Major depressive disorder, single episode, unspecified: Secondary | ICD-10-CM

## 2019-07-22 DIAGNOSIS — J9690 Respiratory failure, unspecified, unspecified whether with hypoxia or hypercapnia: Secondary | ICD-10-CM

## 2019-07-22 DIAGNOSIS — K7200 Acute and subacute hepatic failure without coma: Secondary | ICD-10-CM

## 2019-07-22 DIAGNOSIS — F32A Depression, unspecified: Secondary | ICD-10-CM

## 2019-07-22 DIAGNOSIS — E785 Hyperlipidemia, unspecified: Secondary | ICD-10-CM

## 2019-07-22 DIAGNOSIS — Z8673 Personal history of transient ischemic attack (TIA), and cerebral infarction without residual deficits: Secondary | ICD-10-CM

## 2019-07-22 DIAGNOSIS — J189 Pneumonia, unspecified organism: Secondary | ICD-10-CM

## 2019-07-22 HISTORY — DX: Hyperlipidemia, unspecified: E78.5

## 2019-07-22 HISTORY — DX: Personal history of transient ischemic attack (TIA), and cerebral infarction without residual deficits: Z86.73

## 2019-07-22 LAB — COMPREHENSIVE METABOLIC PANEL
ALT: 221 U/L — ABNORMAL HIGH (ref 0–44)
AST: 131 U/L — ABNORMAL HIGH (ref 15–41)
Albumin: 2.1 g/dL — ABNORMAL LOW (ref 3.5–5.0)
Alkaline Phosphatase: 191 U/L — ABNORMAL HIGH (ref 38–126)
Anion gap: 9 (ref 5–15)
BUN: 32 mg/dL — ABNORMAL HIGH (ref 6–20)
CO2: 22 mmol/L (ref 22–32)
Calcium: 8.6 mg/dL — ABNORMAL LOW (ref 8.9–10.3)
Chloride: 104 mmol/L (ref 98–111)
Creatinine, Ser: 1.03 mg/dL (ref 0.61–1.24)
GFR calc Af Amer: >60 mL/min (ref >60)
GFR calc non Af Amer: >60 mL/min (ref >60)
Glucose, Bld: 105 mg/dL — ABNORMAL HIGH (ref 70–99)
Potassium: 4 mmol/L (ref 3.5–5.1)
Sodium: 135 mmol/L (ref 135–145)
Total Bilirubin: 1.1 mg/dL (ref 0.3–1.2)
Total Protein: 6.6 g/dL (ref 6.5–8.1)

## 2019-07-22 LAB — CBC
HCT: 32 % — ABNORMAL LOW (ref 39.0–52.0)
Hemoglobin: 10.2 g/dL — ABNORMAL LOW (ref 13.0–17.0)
MCH: 30.5 pg (ref 26.0–34.0)
MCHC: 31.9 g/dL (ref 30.0–36.0)
MCV: 95.8 fL (ref 80.0–100.0)
Platelets: 377 10*3/uL (ref 150–400)
RBC: 3.34 MIL/uL — ABNORMAL LOW (ref 4.22–5.81)
RDW: 12.6 % (ref 11.5–15.5)
WBC: 15.9 10*3/uL — ABNORMAL HIGH (ref 4.0–10.5)
nRBC: 0 % (ref 0.0–0.2)

## 2019-07-22 LAB — GLUCOSE, CAPILLARY
Glucose-Capillary: 100 mg/dL — ABNORMAL HIGH (ref 70–99)
Glucose-Capillary: 102 mg/dL — ABNORMAL HIGH (ref 70–99)

## 2019-07-22 MED ORDER — LOSARTAN POTASSIUM 25 MG PO TABS: 12.5000 mg | ORAL_TABLET | Freq: Every day | ORAL | 5 refills | Status: DC

## 2019-07-22 MED ORDER — APIXABAN 5 MG PO TABS: 5.0000 mg | ORAL_TABLET | Freq: Two times a day (BID) | ORAL | 3 refills | Status: DC

## 2019-07-22 MED ORDER — CARVEDILOL 3.125 MG PO TABS: 3.1250 mg | ORAL_TABLET | Freq: Two times a day (BID) | ORAL | 5 refills | Status: DC

## 2019-07-22 MED ORDER — PANTOPRAZOLE SODIUM 40 MG PO TBEC: 40.0000 mg | DELAYED_RELEASE_TABLET | Freq: Every day | ORAL | 6 refills | Status: DC

## 2019-07-22 MED ORDER — ASPIRIN 81 MG PO CHEW: 81.0000 mg | CHEWABLE_TABLET | Freq: Every day | ORAL | 3 refills | Status: DC

## 2019-07-22 MED ORDER — APIXABAN 5 MG PO TABS: 5.0000 mg | ORAL_TABLET | Freq: Two times a day (BID) | ORAL | 4 refills | Status: DC

## 2019-07-22 MED ORDER — DIGOXIN 125 MCG PO TABS: 0.1250 mg | ORAL_TABLET | Freq: Every day | ORAL | 6 refills | Status: DC

## 2019-07-22 MED ORDER — SPIRONOLACTONE 25 MG PO TABS: 25.0000 mg | ORAL_TABLET | Freq: Every day | ORAL | 5 refills | Status: DC

## 2019-07-22 MED ORDER — CITALOPRAM HYDROBROMIDE 10 MG PO TABS: 10.0000 mg | ORAL_TABLET | Freq: Every day | ORAL | 2 refills | Status: DC

## 2019-07-22 MED ORDER — SPIRONOLACTONE 25 MG PO TABS: 25.0000 mg | ORAL_TABLET | Freq: Every day | ORAL | 6 refills | Status: DC

## 2019-07-22 MED ORDER — PANTOPRAZOLE SODIUM 40 MG PO TBEC: 40.0000 mg | DELAYED_RELEASE_TABLET | Freq: Every day | ORAL | 5 refills | Status: DC

## 2019-07-22 MED ORDER — FUROSEMIDE 20 MG PO TABS: 20.0000 mg | ORAL_TABLET | Freq: Every day | ORAL | 4 refills | Status: DC | PRN

## 2019-07-22 MED ORDER — FUROSEMIDE 20 MG PO TABS: 20.0000 mg | ORAL_TABLET | Freq: Every day | ORAL | 2 refills | Status: DC | PRN

## 2019-07-22 MED ORDER — AMIODARONE HCL 200 MG PO TABS: 200.0000 mg | ORAL_TABLET | Freq: Every day | ORAL | 5 refills | Status: DC

## 2019-07-22 MED ORDER — ATORVASTATIN CALCIUM 40 MG PO TABS: 40.0000 mg | ORAL_TABLET | Freq: Every day | ORAL | 5 refills | Status: DC

## 2019-07-22 MED ORDER — CLOPIDOGREL BISULFATE 75 MG PO TABS: 75.0000 mg | ORAL_TABLET | Freq: Every day | ORAL | 4 refills | Status: DC

## 2019-07-22 MED ORDER — ATORVASTATIN CALCIUM 40 MG PO TABS: 40.0000 mg | ORAL_TABLET | Freq: Every day | ORAL | 6 refills | Status: DC

## 2019-07-22 MED ORDER — AMIODARONE HCL 200 MG PO TABS: 200.0000 mg | ORAL_TABLET | Freq: Every day | ORAL | 6 refills | Status: DC

## 2019-07-22 MED ORDER — CARVEDILOL 3.125 MG PO TABS: 3.1250 mg | ORAL_TABLET | Freq: Two times a day (BID) | ORAL | 6 refills | Status: DC

## 2019-07-22 MED ORDER — CLOPIDOGREL BISULFATE 75 MG PO TABS: 75.0000 mg | ORAL_TABLET | Freq: Every day | ORAL | 3 refills | Status: DC

## 2019-07-22 MED ORDER — LOSARTAN POTASSIUM 25 MG PO TABS: 12.5000 mg | ORAL_TABLET | Freq: Every day | ORAL | 6 refills | Status: DC

## 2019-07-22 MED ORDER — DIGOXIN 125 MCG PO TABS: 0.1250 mg | ORAL_TABLET | Freq: Every day | ORAL | 5 refills | Status: DC

## 2019-07-22 MED ORDER — CITALOPRAM HYDROBROMIDE 10 MG PO TABS: 10.0000 mg | ORAL_TABLET | Freq: Every day | ORAL | 1 refills | Status: DC

## 2019-07-22 NOTE — Progress Notes (Signed)
Discharge instructions given to Mr and Mrs Kiernan.  Discussed activities and restrictions.  Discussed signs and symptoms to watch for and when to contact the physician.  Discussed new medications, medications changes a side effects to watch for. Discussed follow up appointments.  Verbalized understanding.

## 2019-07-22 NOTE — Progress Notes (Signed)
Advanced Heart Failure Rounding Note  PCP-Cardiologist: No primary care provider on file.   Subjective:    Events:  3/10 admitted with anterior STEMI and VF arrest with prolonged resuscitation s/p PCI LAD 3/12 self extubated. Developed recurrent respiratory failure and reintubated 3/15 Extubated Transferred out of CCU 3/17 3/20 DC-CV of AFl    Underwent TEE & DC-CV for AFL yesterday. Remains in NSR.   Feels good. Very anxious to go home. Denies SOB or CP.   Objective:   Weight Range: 92.8 kg Body mass index is 26.98 kg/m.   Vital Signs:   Temp:  [98.1 F (36.7 C)-98.7 F (37.1 C)] 98.1 F (36.7 C) (03/21 0829) Pulse Rate:  [93-102] 98 (03/21 1008) Resp:  [18-27] 20 (03/21 0829) BP: (93-100)/(60-66) 100/60 (03/21 0829) SpO2:  [95 %-99 %] 95 % (03/21 0829) Weight:  [92.8 kg] 92.8 kg (03/21 0320) Last BM Date: 07/22/19  Weight change: Filed Weights   07/20/19 0456 07/21/19 0423 07/22/19 0320  Weight: 94.4 kg 93.3 kg 92.8 kg    Intake/Output:   Intake/Output Summary (Last 24 hours) at 07/22/2019 1044 Last data filed at 07/22/2019 0830 Gross per 24 hour  Intake 657 ml  Output 1425 ml  Net -768 ml      Physical Exam   General:  Well appearing. No resp difficulty HEENT: normal Neck: supple. no JVD. Carotids 2+ bilat; no bruits. No lymphadenopathy or thryomegaly appreciated. Cor: PMI nondisplaced. Regular rate & rhythm. No rubs, gallops or murmurs. Lungs: clear Abdomen: soft, nontender, nondistended. No hepatosplenomegaly. No bruits or masses. Good bowel sounds. Extremities: no cyanosis, clubbing, rash, edema Neuro: alert & orientedx3, cranial nerves grossly intact. moves all 4 extremities w/o difficulty. Affect pleasant   Telemetry   NSR 90s Personally reviewed   Labs    CBC Recent Labs    07/22/19 0332  WBC 15.9*  HGB 10.2*  HCT 32.0*  MCV 95.8  PLT 160   Basic Metabolic Panel Recent Labs    07/21/19 0432 07/22/19 0332  NA 133* 135    K 4.2 4.0  CL 101 104  CO2 20* 22  GLUCOSE 110* 105*  BUN 34* 32*  CREATININE 1.17 1.03  CALCIUM 8.6* 8.6*   Liver Function Tests Recent Labs    07/21/19 0432 07/22/19 0332  AST 123* 131*  ALT 203* 221*  ALKPHOS 186* 191*  BILITOT 1.5* 1.1  PROT 6.9 6.6  ALBUMIN 2.2* 2.1*   No results for input(s): LIPASE, AMYLASE in the last 72 hours. Cardiac Enzymes No results for input(s): CKTOTAL, CKMB, CKMBINDEX, TROPONINI in the last 72 hours.  BNP: BNP (last 3 results) Recent Labs    07/11/19 0533  BNP 23.3    ProBNP (last 3 results) No results for input(s): PROBNP in the last 8760 hours.   D-Dimer No results for input(s): DDIMER in the last 72 hours. Hemoglobin A1C No results for input(s): HGBA1C in the last 72 hours. Fasting Lipid Panel No results for input(s): CHOL, HDL, LDLCALC, TRIG, CHOLHDL, LDLDIRECT in the last 72 hours. Thyroid Function Tests No results for input(s): TSH, T4TOTAL, T3FREE, THYROIDAB in the last 72 hours.  Invalid input(s): FREET3  Other results:   Imaging    No results found.   Medications:     Scheduled Medications: . amiodarone  200 mg Oral Daily  . apixaban  5 mg Oral BID  . aspirin  81 mg Oral Daily  . chlorhexidine gluconate (MEDLINE KIT)  15 mL Mouth Rinse BID  .  Chlorhexidine Gluconate Cloth  6 each Topical Daily  . clopidogrel  75 mg Oral Daily  . digoxin  0.125 mg Per Tube Daily  . feeding supplement (ENSURE ENLIVE)  237 mL Oral TID BM  . furosemide  20 mg Oral Daily  . insulin aspart  0-9 Units Subcutaneous TID AC & HS  . multivitamin with minerals  1 tablet Oral Daily  . pantoprazole  40 mg Oral Daily  . potassium chloride  20 mEq Oral BID  . sodium chloride flush  10-40 mL Intracatheter Q12H  . sodium chloride flush  10-40 mL Intracatheter Q12H  . spironolactone  25 mg Oral Daily    Infusions: . sodium chloride Stopped (07/18/19 1430)    PRN Medications: acetaminophen (TYLENOL) oral liquid 160 mg/5 mL,  sodium chloride flush, sodium chloride flush, traMADol    Assessment/Plan   1. Witnessed Out of Hospital VF Arrest in setting of anterior STEMI  - Witnessed by EMS w/ prompt initiation of ACLS - Estimated 40-50 min resucitation time - Secondary to acute anterior STEMI w/ proximal LAD occlusion, s/p PCI  - EF 15% - > TEE 3/20 EF 30-35% - s/p hypothermia  - Improved. Cognitively intact   2. Anterior STEMI - LHC showed 99% proximal LAD thrombotic occlusion, s/p PCI + DES placement. No other significant disease - EF 15%. -> TEE 3/20 EF 30-35% - Continue DAPT w/ ASA + plavix due to addition of eliquis.  - Off statin due to elevated LFTs which are trending back up - Add low-dose losartan and carvedilol - LFT essentially flat from yesterday. I suspect it was related to abx (cephalosporins). Ok to start statin at reduced dose  3. Acute systolic HF -> Cardiogenic Shock  - initial echo 07/11/19 EF 15%. RV systolic function mildly reduced  - f/u echo on 3/13 LVEF 20-25% - TEE 3/20 EF 30-35% - Volume status improved with IV lasix - Continue lasix 20 daily po in am - Continue digoxin 0.125 mcg - Continue spironolactone 25 mg daily  - Add low-dose losartan and b-blocker - Add Farxiga soon  - Renal function stable.    4. Acute hypoxic respiratory failure. -In setting of VF arrest. -Self extubated 3/12.  Reintubated 3/12 due to recurrent respiratory failure Extubated 3/15. Sats stable on room air.   -on abx for suspected PNA.  PCT 24 -> 0.21 - Completes cefipime. - Trach aspirate NGTD - Will repeat CXR and WBC ina m    5. Post MI Atrial Fibrillation/A flutter  - Amio stopped 07/16/19 but restarted with a flutter. - s/p TEE/DC-CV on 3/20  -> NSR  - Continue eliquis 5 mg twice a day.   6. Shock Liver:  - 2/2 Acute MI/ cardiogenic shock  - Overall improved, however LFTs continue to trend back off - possible cefepime vs amio affect  - LFT essentially flat from yesterday. I suspect  it was related to abx (cephalosporins) or IV amio. Ok to start statin at reduced dose  7. AKI:  - resolved  8. Multiple strokes on MRI - likely cardio embolic from CPR/arrest - start atorva 40 - continue Eliquis  9. Depression - started Celexa for mood stability   Home today on:  ASA 81 (stop 4/11) Plavix 75 daily  Eliquis 5 bid Atorva 40 daily Amio 200 daily Losartan 12.5 daily  Carvedilol 3.125 bid Digoxin 0.125 daily Protonix 40 daily Spiro 25 daily  Celexa 10 daily  Lasix 80m prn only for swelling  F/u [facility] Clinic  and with Dr. Ellyn Hack  I pulled PICC personally to facilitate discharge.    Length of Stay: 32  Glori Bickers, MD  07/22/2019, 10:44 AM  Advanced Heart Failure Team Pager [phone] (M-F; 7a - 4p)  Please contact [facility] for night-coverage after hours (4p -7a ) and weekends on [email or web address]

## 2019-07-22 NOTE — Care Management (Signed)
Patient given Eliquis cards for 30 day free and copay.  Patient states Rosann Auerbach has contacted him regarding home health services.  Arrangement is in progress.

## 2019-07-22 NOTE — Discharge Summary (Addendum)
Discharge Summary    Patient ID: Edward Mendoza MRN: [id number]; DOB: 10/30/1970  Admit date: 07/11/2019 Discharge date: 07/22/2019  Primary Care Provider: Lise Auer, MD  Primary Cardiologist: Bryan Lemma, MD Primary Electrophysiologist:  None   Discharge Diagnoses    Principal Problem:   Cardiac arrest with ventricular fibrillation [facility]) Active Problems:   Acute ST elevation myocardial infarction (STEMI) involving left anterior descending coronary artery (HCC)   Arterial hypotension   Endotracheally intubated   Paroxysmal atrial fibrillation (HCC)   Acute systolic heart failure (HCC) EF 30-35% post stent   Encephalopathy   History of multiple strokes   Respiratory failure (HCC)   Pneumonia   Shock liver   Depression   Cardiogenic shock (HCC) Initial E 15%   Hyperlipidemia   Diagnostic Studies/Procedures    Cardiac catheterization 07/11/19 There is moderate left ventricular systolic dysfunction. LV end diastolic pressure is moderately elevated. The left ventricular ejection fraction is 35-45% by visual estimate. There is no aortic valve stenosis. Prox LAD to Mid LAD lesion is 95% stenosed. Post intervention, there is a 0% residual stenosis. A drug-eluting stent was successfully placed using a STENT RESOLUTE ONYX 3.5X18.   SUMMARY Anterior STEMI complicated by VF arrest.   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  Otherwise minimal disease elsewhere. Successful DES PCI of proximal LAD using resolute Onyx DES 3.5 x 18 mm postdilated to 4.0 mm. Systolic hypotension with borderline cardiogenic shock with systolic pressures in the 90s over 60s requiring Levophed at 10 mcg/min. -->  Cardiac output-index 4.77-2.04; cardiac power 0.8. No evidence of pulmonary hypertension   RECOMMENDATIONS Concern for possible seizure activity following PCI. ->  Discussed with PCCM, will send for head CT following cath and have neurology  consult We will continue IV Kengreal until OG tube placed and able to receive Brilinta. Have discussed with critical care & Dr. Gala Romney ([facility]), plan to avoid hypothermia protocol given significant movement upon arrival.  Appreciate PCCM assistance with sedation No blood pressure room currently for GDMT with beta-blocker.  Currently on Levophed-wean as tolerated. Obtain 2D echocardiogram Coronary Diagrams  Diagnostic Dominance: Right  Intervention    _____________    Echo 07/11/19 1. Left ventricular ejection fraction, by estimation, is 15%%. The left  ventricle has severely decreased function. The left ventricle demonstrates  global hypokinesis. There is moderate left ventricular hypertrophy. Left  ventricular diastolic parameters  are indeterminate.   2. Right ventricular systolic function is mildly reduced. The right  ventricular size is normal. Tricuspid regurgitation signal is inadequate  for assessing PA pressure.   3. The mitral valve is grossly normal. Trivial mitral valve  regurgitation.   4. The aortic valve is tricuspid. Aortic valve regurgitation is not  visualized.   5. The inferior vena cava is dilated in size with <50% respiratory  variability, suggesting right atrial pressure of 15 mmHg.  Echo limited 07/14/19  1. Left ventricular ejection fraction, by estimation, is 20 to 25%. The  left ventricle has severely decreased function. There is severe  hypokinesis of the left ventricular, entire anteroseptal wall, anterior  wall and apical segment. There is moderate  hypokinesis of the left ventricular, mid-apical inferior wall.   TEE/DCCV 07/21/19 LEFT VENTRICLE: EF = 30-35% with akinesis of mid to distal anterior wall and apex RIGHT VENTRICLE: Normal LEFT ATRIUM: Moderately dilated  LEFT ATRIAL APPENDAGE: No clot RIGHT ATRIUM: Normal AORTIC VALVE:  Trileaflet.  No AI/AS MITRAL VALVE:    Normal  trivial MR TRICUSPID VALVE: Normal mild TR PULMONIC  VALVE: Normal INTERATRIAL SEPTUM: IAS aneurysm + PFO PERICARDIUM: Small anterior effusion DESCENDING AORTA: Moderate to severe plaque  CARDIOVERSION:      Indications:  Atrial Flutter   Procedure Details:   Once the TEE was complete, the patient had the defibrillator pads placed in the anterior and posterior position. Once an appropriate level of sedation was achieved, the patient received a single biphasic, synchronized 150J shock with prompt conversion to sinus rhythm. No apparent complications.    History of Present Illness     Edward Mendoza is a 49 y.o. male with no pmh who presented with acute STEMI complicated by witnessed VF arrest who was brought to the ED 07/11/19. The patient had called EMS for stuttering chest pain for the last several days. When EMS arrived he was alert and oriented. He subsequently had a witnessed VF arrest with immediate resuscitation. He received a total of 6 external defibrillation and  epi and had emergency airway placed. He had a total of 40-45 minutes of ACLS before ROSC which was shortly after arrival to the ED. In the ED EKG demonstrated STEMI and he was transferred emergently to [facility] hospital for cardiac cath.   Hospital Course     Consultants: Critical Care, PT/OT  Witnessed VF arrest and prolonged resuscitation Patient was brought by EMS who witnessed VF arrest. ACLS for 40-45 minutes before ROSC acheived. On arrival found to have STEMI and urgently taken to cath found to have LAD occlusion treated with PCI. EF found to be 15%. EEG with diffuse slowing. Electrolytes supplemented as need with goal for Potassium >4 and Magnesium>2. Patient self extubated 3/12 and then was reintubated 3/12 for respiratory failure. Extubated again on 3/15. The patient was transferred out of CCU 3/17.  Anterior STEMI LHC showed 99% proximal LAD occlusion treated with PCI/DES to LAD. No other significant disease. He was started on DAPT with Aspirin and Brilinta. No statin  started secondary to shock liver. HS troponin >27K for multiple days. BB held in the setting of low output heart failure. Brilinta was stopped and plavix started with the addition of Eliquis (for aflutter). Patient worked with cardiac rehab and was able to ambulate up and down the halls with rollator. Did have intermittent pleuritic symptoms as well as chest wall soreness from CPR. TEE 3/20 EF 30-35%. Low dose Losartan and Coreg added. LDL 132 and patient was started on Atorvastatin  daily. Plan for ASA, Plavix, and Eliquis for 1 month with discontinuation of Aspirin 4/11.   Acute systolic HF/Cardiogenic shock EF of 15% and RV systolic function mildly reduced. Ernestine Conrad was inserted. He was started on milrinone and epinephrine. Digoxin and spironolactone also initiated.  Eventually started on IV lasix. Limited echo 3/13 showed EF 20-25%. Volume status improved with diuresis. Lasix was briefly held for rising creatinine and low CVP. AKI resolved and he was started on oral low dose. Losartan added.  TEE 3/20 showed EF 30-35% with akinesis of mid to distal anterior wall and apex and normal RV function. Patient out out a total of 2.3L during admission. Weight down significantly since admission from 249lbs to 204 lbs on day of discharge. Plan to send patient out on Lasix 20 mg to use as needed. Will send in new prescriptions for Coreg 3.125mg  BID, Losartan 12.5mg  daily, spironolactone  daily, Digoxin 0.125 mg daily.  Acute hypoxic respiratory failure/Pneumonia In the setting of VF arrest. Patient self extubated 3/12 but reintubated that day  due to respiratory failure. CXR suggested multifocal PNA, suspected aspiration, and started on broad spectrum abx. Procalcitonine came back at 2.4. Repeat CXR with improved multifocal pneumonia. Extubated again on 3/15. White count continue to climb reaching 17. Patient with no fever, chills. CXR showed rt basilar airspace disease, atelectasis vs PNA. Cefepime was continued  and he was given additional IV lasix. WBC trended down to 15.9.  Antibiotics dose was completed during admission.  Encephalopathy post Cardiac arrest/ Multiple Strokes Seen on CT head R cerebellar infarct which appeared remote or subacute. Repeat CT head 3/13 with new wedge shaped hypodensity involving the parasagittal right occipital lobe, suspicious for an evolving acute ischemic infarct, right PCA territory, additional subtle hypodensity at the subdcortical white matter of the anterior right frontal lobe, chronic right cerebellar infarct . Patient initially started on Keppra. Neurology performed EEG which was negative and Keppra was discontinued. The patient cognitively improved throughout admission. PT/OT consulted who recommended home health with PT, 24 hour assistance/supervision, and rolling walker. Statin started. Plan to discharge home in the care of his wife and son.  Post MI Afib/Aflutter Patient had post MI afib who was started on amiodarone bolus and drip. Patient converted and amiodarone was stopped 3/15 after conversion. Patient noted to go back in Afib, rates 100-110 and re-started on amiodarone bolus/drip. The patient was started on Eliquis 5 mg BID. Patient stayed in aflutter RVR and ultimately underwent TEE showing no clot and successful DDCV with a single biphasic synchronized 150J shock. Patient remained in sinus overnight. IV transitioned to oral amiodarone 200 mg daily.   Shock liver In the setting of Acute MI and cardiogenic shock. Statin started but later stopped due to rising AST/ALT. Suspected elevation due to amiodarone or cefepime. Plan to restart low dose statin on discharge. AST 131 and ALT 221 on day of discharge which is not much different from the previous day. Patient will need follow-up LFTs.   AKI In the setting oh hypotension/cardiogenic shock. Creatinine 1.3 on admission. Peak of 2.08 and lasix held a couple days. After resolution of AKI low dose lasix re-started.  Plan to send out on Lasix as needed. Creatinine on day of discharge 1.03.   Depression Celexa started. Recommend follow-up with PCP for continued management.   The patient was evaluated on 07/22/19 by Dr. Gala Romney and felt to be stable for discharge. Hospital follow-up will be arranged.   Did the patient have an acute coronary syndrome (MI, NSTEMI, STEMI, etc) this admission?:  Yes                               AHA/ACC Clinical Performance & Quality Measures: Aspirin prescribed? - Yes ADP Receptor Inhibitor (Plavix/Clopidogrel, Brilinta/Ticagrelor or Effient/Prasugrel) prescribed (includes medically managed patients)? - Yes Beta Blocker prescribed? - Yes High Intensity Statin (Lipitor 40-80mg  or Crestor 20-40mg ) prescribed? - Yes EF assessed during THIS hospitalization? - Yes For EF <40%, was ACEI/ARB prescribed? - Yes For EF <40%, Aldosterone Antagonist (Spironolactone or Eplerenone) prescribed? - Yes Cardiac Rehab Phase II ordered (Included Medically managed Patients)? - Yes   _____________  Discharge Vitals Blood pressure 100/60, pulse 98, temperature 98.1 F (36.7 C), temperature source Oral, resp. rate 20, height 6\' 1"  (1.854 m), weight 92.8 kg, SpO2 95 %.  Filed Weights   07/20/19 0456 07/21/19 0423 07/22/19 0320  Weight: 94.4 kg 93.3 kg 92.8 kg    Labs & Radiologic Studies    CBC Recent  Labs    07/22/19 0332  WBC 15.9*  HGB 10.2*  HCT 32.0*  MCV 95.8  PLT 377   Basic Metabolic Panel Recent Labs    29/52/84 0432 07/22/19 0332  NA 133* 135  K 4.2 4.0  CL 101 104  CO2 20* 22  GLUCOSE 110* 105*  BUN 34* 32*  CREATININE 1.17 1.03  CALCIUM 8.6* 8.6*   Liver Function Tests Recent Labs    07/21/19 0432 07/22/19 0332  AST 123* 131*  ALT 203* 221*  ALKPHOS 186* 191*  BILITOT 1.5* 1.1  PROT 6.9 6.6  ALBUMIN 2.2* 2.1*   No results for input(s): LIPASE, AMYLASE in the last 72 hours. High Sensitivity Troponin:   Recent Labs  Lab 07/11/19 0149  07/11/19 0533 07/11/19 1138 07/14/19 0854 07/14/19 1041  TROPONINIHS 25,289* >27,000* >27,000* >27,000* >27,000*    BNP Invalid input(s): POCBNP D-Dimer No results for input(s): DDIMER in the last 72 hours. Hemoglobin A1C No results for input(s): HGBA1C in the last 72 hours. Fasting Lipid Panel No results for input(s): CHOL, HDL, LDLCALC, TRIG, CHOLHDL, LDLDIRECT in the last 72 hours. Thyroid Function Tests No results for input(s): TSH, T4TOTAL, T3FREE, THYROIDAB in the last 72 hours.  Invalid input(s): FREET3 _____________  DG Chest 1 View  Result Date: 07/15/2019 CLINICAL DATA:  Larey Seat respiratory distress syndrome. EXAM: CHEST  1 VIEW COMPARISON:  07/14/2019 FINDINGS: Bilateral airspace lung opacities have improved from the previous day's exam. No new lung abnormalities. No convincing pleural effusion and no pneumothorax. Tracheostomy tube, nasal/orogastric tube and femoral a placed Swan-Ganz catheter are stable. IMPRESSION: 1. Bilateral airspace lung opacities have improved compared to the previous day's exam. 2. No other change. No new lung abnormalities. Stable support apparatus. Electronically Signed   By: Amie Portland M.D.   On: 07/15/2019 08:54   DG Chest 1 View  Result Date: 07/14/2019 CLINICAL DATA:  ETT EXAM: CHEST  1 VIEW COMPARISON:  07/14/2019 at 0224 hours FINDINGS: Endotracheal tube terminates 5.5 cm above the carina. Enteric tube terminates in the proximal gastric body. Multifocal patchy airspace opacities in the lungs bilaterally, upper lung predominant. No pleural effusion or pneumothorax. The heart is normal in size. Inferior approach Swan-Ganz catheter terminates in the left main pulmonary artery. IMPRESSION: Endotracheal tube terminates 5.5 cm above the carina. Additional support apparatus as above. Multifocal patchy opacities, suggesting multifocal pneumonia, suspicious for infection/aspiration, grossly unchanged. Electronically Signed   By: Charline Bills M.D.    On: 07/14/2019 08:01   DG Chest 2 View  Result Date: 07/20/2019 CLINICAL DATA:  Right lower chest pain EXAM: CHEST - 2 VIEW COMPARISON:  07/17/2019 FINDINGS: There is a right-sided PICC line with the tip projecting over the cavoatrial junction. There is hazy right basilar airspace disease which may reflect atelectasis versus pneumonia. There is no pleural effusion or pneumothorax. The heart and mediastinal contours are unremarkable. There is no acute osseous abnormality. IMPRESSION: Hazy right basilar airspace disease which may reflect atelectasis versus pneumonia. Electronically Signed   By: Elige Ko   On: 07/20/2019 10:07   CT HEAD WO CONTRAST  Result Date: 07/14/2019 CLINICAL DATA:  Initial evaluation for acute encephalopathy, somnolence. EXAM: CT HEAD WITHOUT CONTRAST TECHNIQUE: Contiguous axial images were obtained from the base of the skull through the vertex without intravenous contrast. COMPARISON:  Prior CT from 07/11/2019. FINDINGS: Brain: Urine volume within normal limits. Chronic appearing right cerebellar infarct again noted. There is a new wedge-shaped focal hypodensity involving the parasagittal right occipital lobe (  series 4, image 19), suspicious for an evolving acute ischemic infarct, right PCA territory (series 4, image 19). Additionally, subtle hypodensity seen at the subcortical white matter of the anterior right frontal lobe, age indeterminate, but could also reflect sequelae of evolving ischemia (series 4, image 24). Gray-white matter differentiation otherwise maintained. No intracranial hemorrhage. No mass lesion or midline shift. No hydrocephalus or extra-axial fluid collection. Vascular: No hyperdense vessel. Scattered vascular calcifications noted within the carotid siphons. Skull: Scalp soft tissues within normal limits. Calvarium intact. Sinuses/Orbits: Globes and orbital soft tissues within normal limits. Moderate mucosal thickening in opacity within the sphenoid ethmoidal  sinuses and maxillary sinuses. Trace right mastoid effusion. Other: None. IMPRESSION: 1. New wedge-shaped hypodensity involving the parasagittal right occipital lobe, suspicious for an evolving acute ischemic infarct, right PCA territory. Follow-up examination with dedicated MRI suggested for confirmation. 2. Additional subtle hypodensity at the subcortical white matter of the anterior right frontal lobe, age indeterminate, but could also reflect sequelae of evolving ischemia. 3. Chronic right cerebellar infarct. 4. Moderate paranasal sinus disease. Electronically Signed   By: Rise Mu M.D.   On: 07/14/2019 04:20   CT HEAD WO CONTRAST  Result Date: 07/11/2019 CLINICAL DATA:  Seizure activity.  Encephalopathy. EXAM: CT HEAD WITHOUT CONTRAST TECHNIQUE: Contiguous axial images were obtained from the base of the skull through the vertex without intravenous contrast. COMPARISON:  None. FINDINGS: Brain: The ventricles are in the midline without mass effect or shift. They are normal in size and configuration. No extra-axial fluid collections are identified. Contrast noted from prior cardiac catheterization. Evidence of a right cerebellar infarct of uncertain age but probably remote or subacute. It does not appear acute. No other abnormalities are identified. Vascular: The major vascular structures are unremarkable. No aneurysm. Skull: No skull fracture or bone lesions. Sinuses/Orbits: Moderate mucoperiosteal thickening and partial opacification of the ethmoid air cells and sphenoid sinus. The mastoid air cells and middle ear cavities are clear. The globes are intact. Other: No scalp lesions or hematoma. IMPRESSION: 1. Contrast is present and likely from cardiac catheterization. 2. Remote or subacute right cerebellar infarct. 3. No other significant intracranial findings. 4. Paranasal sinus disease. Electronically Signed   By: Rudie Meyer M.D.   On: 07/11/2019 05:30   MR BRAIN WO CONTRAST  Result Date:  07/18/2019 CLINICAL DATA:  Cardiac arrest 1 week ago. Resuscitation. Encephalopathy. EXAM: MRI HEAD WITHOUT CONTRAST TECHNIQUE: Multiplanar, multiecho pulse sequences of the brain and surrounding structures were obtained without intravenous contrast. COMPARISON:  Head CT 07/14/2018 FINDINGS: Brain: There is an old inferior cerebellar infarction on the right. There are innumerable punctate acute infarctions throughout both cerebellar hemispheres and both cerebral hemispheres consistent with micro embolic infarctions from the heart or ascending aorta. Largest area of infarction is about 1.2 cm in size at the right medial parietooccipital junction. The study does not show atypical diffuse hypoxic ischemic pattern. Deep brain structures appear intact. No evidence of mass, hemorrhage, hydrocephalus or extra-axial collection. Vascular: Major vessels at the base of the brain show flow. Skull and upper cervical spine: Negative Sinuses/Orbits: Sphenoid sinusitis. Possible fungus ball in the right division of the sphenoid sinus. Other: None IMPRESSION: Innumerable punctate acute infarctions scattered throughout the cerebellum and both cerebral hemispheres most consistent with micro embolic infarctions from the heart or ascending aorta. The study does not show a typical pattern of diffuse hypoxic ischemic injury. 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 Sphenoid sinusitis. 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 This appearance could also be due  to chronic inspissated mucus. Electronically Signed   By: Paulina FusiMark  Shogry M.D.   On: 07/18/2019 12:29   CARDIAC CATHETERIZATION  Result Date: 07/11/2019  There is moderate left ventricular systolic dysfunction.  LV end diastolic pressure is moderately elevated.  The left ventricular ejection fraction is 35-45% by visual estimate.  There is no aortic valve stenosis.  Prox LAD to Mid LAD lesion is 95% stenosed.  Post intervention, there is a 0% residual stenosis.  A  drug-eluting stent was successfully placed using a STENT RESOLUTE ONYX 3.5X18.  SUMMARY  Anterior STEMI complicated by VF arrest.   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  Otherwise minimal disease elsewhere.  Successful DES PCI of proximal LAD using resolute Onyx DES 3.5 x 18 mm postdilated to 4.0 mm.  Systolic hypotension with borderline cardiogenic shock with systolic pressures in the 90s over 60s requiring Levophed at 10 mcg/min. -->  Cardiac output-index 4.77-2.04; cardiac power 0.8.  No evidence of pulmonary hypertension RECOMMENDATIONS  Concern for possible seizure activity following PCI. ->  Discussed with PCCM, will send for head CT following cath and have neurology consult  We will continue IV Kengreal until OG tube placed and able to receive Brilinta.  Have discussed with critical care & Dr. Gala RomneyBensimhon ([facility]), plan to avoid hypothermia protocol given significant movement upon arrival.  Appreciate PCCM assistance with sedation  No blood pressure room currently for GDMT with beta-blocker.  Currently on Levophed-wean as tolerated.  Obtain 2D echocardiogram Bryan Lemmaavid Harding, M.D., M.S. Interventional Cardiologist [address] [place], [place] [id number]  DG CHEST PORT 1 VIEW  Result Date: 07/17/2019 CLINICAL DATA:  PICC line placement EXAM: PORTABLE CHEST 1 VIEW COMPARISON:  07/17/2019, 07/15/2019 FINDINGS: Placement of right upper extremity central venous catheter with tip faintly visible at the cavoatrial region. Ascending pulmonary arterial catheter tip overlies the left pulmonary artery. Low lung volumes. Stable cardiomediastinal silhouette with decreased vascular congestion. Patchy airspace disease at the left base without change IMPRESSION: 1. Right upper extremity central venous catheter tip projects over the cavoatrial region 2. Cardiomegaly with decreased vascular congestion 3. Patchy atelectasis or infiltrate at the  left base Electronically Signed   By: Jasmine PangKim  Fujinaga M.D.   On: 07/17/2019 19:33   DG CHEST PORT 1 VIEW  Result Date: 07/17/2019 CLINICAL DATA:  Respiratory failure EXAM: PORTABLE CHEST 1 VIEW COMPARISON:  07/15/2019 FINDINGS: Interval endotracheal extubation. Inferior approach pulmonary vascular catheter remains with tip directed into the left pulmonary artery. Improved bilateral heterogeneous airspace opacity. IMPRESSION: 1.  Interval endotracheal extubation. 2. Fall improved bilateral heterogeneous airspace opacity, consistent with improved infection or edema. Electronically Signed   By: Lauralyn PrimesAlex  Bibbey M.D.   On: 07/17/2019 09:19   DG CHEST PORT 1 VIEW  Result Date: 07/14/2019 CLINICAL DATA:  Cardiac arrest EXAM: PORTABLE CHEST 1 VIEW COMPARISON:  Radiograph 07/12/2019 FINDINGS: Lung volumes are significantly diminished. There are patchy multifocal opacities in the right and left perihilar regions. No pneumothorax or visible effusion. Cardiac prominence likely related to low volumes and portable technique, similar to comparison portable radiographs. No acute osseous or soft tissue abnormality. Removal of the endotracheal and transesophageal tubes as well as a lower extremity approach Swan-Ganz catheterization. IMPRESSION: Patchy multifocal opacities in the right and left perihilar regions, could reflect developing infection, edema and/or ARDS in the setting of resuscitative efforts. Electronically Signed   By: Kreg ShropshirePrice  DeHay M.D.   On: 07/14/2019 02:45   DG CHEST PORT 1 VIEW  Result Date: 07/12/2019 CLINICAL DATA:  Cardiac arrest. EXAM: PORTABLE CHEST 1 VIEW COMPARISON:  07/11/2019 FINDINGS: The endotracheal tube, NG tube and Swan-Ganz catheters are stable. Low lung volumes with vascular crowding and streaky basilar atelectasis but no infiltrates or effusions. No pneumothorax. IMPRESSION: 1. Stable support apparatus. 2. Low lung volumes with vascular crowding and streaky basilar atelectasis. Electronically  Signed   By: Rudie Meyer M.D.   On: 07/12/2019 08:58   DG Chest Port 1 View  Result Date: 07/11/2019 CLINICAL DATA:  Intubation EXAM: PORTABLE CHEST 1 VIEW COMPARISON:  None. FINDINGS: Endotracheal tube tip is at the clavicular heads. The orogastric tube reaches the stomach. Swan-Ganz catheter from below with tip at the right main pulmonary artery. Tubing traversing the left neck and mediastinum, appearance more suggestive of external location than IJ line. No chart indication of line placement. Low volume but clear lungs. Normal heart size. IMPRESSION: 1. Unremarkable endotracheal and orogastric tubes. 2. Swan-Ganz catheter from below with tip at the right main pulmonary artery. 3. No pulmonary edema. Electronically Signed   By: Marnee Spring M.D.   On: 07/11/2019 06:10   DG Abd Portable 1V  Result Date: 07/11/2019 CLINICAL DATA:  Feeding tube placement. EXAM: PORTABLE ABDOMEN - 1 VIEW COMPARISON:  No prior. FINDINGS: Swan-Ganz catheter noted. Tip is not visualized. NG tube noted with its tip in the upper most portion stomach. Side hole is at the gastroesophageal junction. Advancement of the NG tube approximately 15 cm should be considered. No gastric distention. No bowel distention. No free air. Scratch IMPRESSION: NG tube noted with its tip in the upper most portion of the stomach. Side hole is at the gastroesophageal junction. Advancement of the NG tube approximately 15 cm should be considered. Electronically Signed   By: Maisie Fus  Register   On: 07/11/2019 13:26   EEG adult  Result Date: 07/11/2019 Charlsie Quest, MD     07/14/2019  7:57 AM Patient Name: Edward Mendoza MRN: [id number] Epilepsy Attending: Charlsie Quest Referring Physician/Provider: Dr Bryan Lemma Date: 07/11/2019 Duration: 22.30 mins Patient history: 49yo M who presented with cardiac arrest on TTM. EEG to evaluate for seizure Level of alertness: comatose AEDs during EEG study: LEV, propofol Technical aspects: This EEG study  was done with scalp electrodes positioned according to the 10-20 International system of electrode placement. Electrical activity was acquired at a sampling rate of 500Hz  and reviewed with a high frequency filter of 70Hz  and a low frequency filter of 1Hz . EEG data were recorded continuously and digitally stored. DESCRIPTION: EEG showed generalized 13-15Hz  sharply contoured beta activity admixed with polymorphic 6-7hz  theta activity. Brief periods of generalized eeg attenuation lasting 2-3 seconds was also noted. EEG was not reactive to tactile stimulation. Hyperventilation and photic stimulation were not performed. ABNORMALITY - Continuous slow, generalized - Excessive beta, generalized - Background attenuation, generalized IMPRESSION: This study is suggestive of profound diffuse encephalopathy, non specific to etiology. No seizures or epileptiform discharges were seen throughout the recording. Priyanka Annabelle Harman   Overnight EEG with video  Result Date: 07/12/2019 Charlsie Quest, MD     07/14/2019  7:57 AM Patient Name: Edward Mendoza MRN: [id number] Epilepsy Attending: Charlsie Quest Referring Physician/Provider: Dr Bryan Lemma Duration: 07/11/2019 4259 to 07/12/2019 0924 Total Study duration: 24 hours   Patient history: 49yo M who presented with cardiac arrest on TTM. EEG to evaluate for seizure   Level of alertness: comatose   AEDs during EEG study: LEV, propofol  Technical aspects: This EEG study was done with scalp electrodes positioned according to the 10-20 International system of electrode placement. Electrical activity was acquired at a sampling rate of 500Hz  and reviewed with a high frequency filter of 70Hz  and a low frequency filter of 1Hz . EEG data were recorded continuously and digitally stored.   DESCRIPTION: EEG showed generalized 13-15Hz  sharply contoured beta activity admixed with polymorphic 6-7hz  theta activity. Brief periods of generalized eeg attenuation lasting 2-3 seconds was also noted.  EEG was not reactive to tactile stimulation. Hyperventilation and photic stimulation were not performed. Event button was pressed on 07/11/2019 1543, 1615 and 1630 for arm twitching. Concomitant eeg before, during and after the event didn't show any change to suggest seizure.   ABNORMALITY - Continuous slow, generalized - Excessive beta, generalized - Background attenuation, generalized   IMPRESSION: This study is suggestive of profound diffuse encephalopathy, non specific to etiology. No seizures or epileptiform discharges were seen throughout the recording.   Event button was pressed as describe above without eeg change and were likely not epileptic. Charlsie Quest   ECHOCARDIOGRAM COMPLETE  Result Date: 07/11/2019    ECHOCARDIOGRAM REPORT   Patient Name:   Edward Mendoza Date of Exam: 07/11/2019 Medical Rec #:  [id number]      Height:       74.0 in Accession #:    [id number]     Weight:       249.1 lb Date of Birth:  10-02-1970      BSA:          2.386 m Patient Age:    48 years       BP:           97/85 mmHg Patient Gender: M              HR:           104 bpm. Exam Location:  Inpatient Procedure: 2D Echo, Cardiac Doppler and Color Doppler Indications:    Cardiac Arrhythmias / LV Function. Cardiac arrest  History:        Patient has no prior history of Echocardiogram examinations.                 Acute MI, Abnormal ECG, Arrythmias:Cardiac Arrest and VF;                 Signs/Symptoms:Hypotension.  Sonographer:    Sheralyn Boatman RDCS Referring Phys: [id number] DAVID W HARDING IMPRESSIONS  1. Left ventricular ejection fraction, by estimation, is 15%%. The left ventricle has severely decreased function. The left ventricle demonstrates global hypokinesis. There is moderate left ventricular hypertrophy. Left ventricular diastolic parameters are indeterminate.  2. Right ventricular systolic function is mildly reduced. The right ventricular size is normal. Tricuspid regurgitation signal is inadequate for assessing PA pressure.   3. The mitral valve is grossly normal. Trivial mitral valve regurgitation.  4. The aortic valve is tricuspid. Aortic valve regurgitation is not visualized.  5. The inferior vena cava is dilated in size with <50% respiratory variability, suggesting right atrial pressure of 15 mmHg. FINDINGS  Left Ventricle: Left ventricular ejection fraction, by estimation, is 15%%. The left ventricle has severely decreased function. The left ventricle demonstrates global hypokinesis. The left ventricular internal cavity size was normal in size. There is moderate left ventricular hypertrophy. Left ventricular diastolic parameters are indeterminate. Right Ventricle: The right ventricular size is normal. No increase in right ventricular wall thickness. Right ventricular systolic function is mildly reduced. Tricuspid regurgitation  signal is inadequate for assessing PA pressure. Left Atrium: Left atrial size was normal in size. Right Atrium: Right atrial size was normal in size. Pericardium: There is no evidence of pericardial effusion. Mitral Valve: The mitral valve is grossly normal. Trivial mitral valve regurgitation. Tricuspid Valve: The tricuspid valve is grossly normal. Tricuspid valve regurgitation is not demonstrated. Aortic Valve: The aortic valve is tricuspid. Aortic valve regurgitation is not visualized. Pulmonic Valve: The pulmonic valve was grossly normal. Pulmonic valve regurgitation is not visualized. Aorta: The aortic root, ascending aorta, aortic arch and descending aorta are all structurally normal, with no evidence of dilitation or obstruction. Venous: The inferior vena cava is dilated in size with less than 50% respiratory variability, suggesting right atrial pressure of 15 mmHg. IAS/Shunts: The interatrial septum was not well visualized.  LEFT VENTRICLE PLAX 2D LVIDd:         4.18 cm     Diastology LVIDs:         3.97 cm     LV e' lateral:   3.15 cm/s LV PW:         1.51 cm     LV E/e' lateral: 7.1 LV IVS:         1.60 cm     LV e' medial:    3.70 cm/s LVOT diam:     2.30 cm     LV E/e' medial:  6.1 LV SV:         25 LV SV Index:   10 LVOT Area:     4.15 cm  LV Volumes (MOD) LV vol d, MOD A2C: 98.4 ml LV vol d, MOD A4C: 90.8 ml LV vol s, MOD A2C: 82.2 ml LV vol s, MOD A4C: 72.6 ml LV SV MOD A2C:     16.1 ml LV SV MOD A4C:     90.8 ml LV SV MOD BP:      21.3 ml RIGHT VENTRICLE            IVC RV S prime:     9.03 cm/s  IVC diam: 2.18 cm TAPSE (M-mode): 1.5 cm LEFT ATRIUM           Index       RIGHT ATRIUM           Index LA diam:      2.60 cm 1.09 cm/m  RA Area:     10.80 cm LA Vol (A2C): 18.2 ml 7.63 ml/m  RA Volume:   21.00 ml  8.80 ml/m LA Vol (A4C): 32.0 ml 13.41 ml/m  AORTIC VALVE LVOT Vmax:   43.80 cm/s LVOT Vmean:  33.100 cm/s LVOT VTI:    0.059 m  AORTA Ao Root diam: 3.20 cm Ao Asc diam:  3.00 cm MITRAL VALVE MV Area (PHT): 5.06 cm    SHUNTS MV Decel Time: 150 msec    Systemic VTI:  0.06 m MV E velocity: 22.45 cm/s  Systemic Diam: 2.30 cm MV A velocity: 52.85 cm/s MV E/A ratio:  0.42 Zoila Shutter MD Electronically signed by Zoila Shutter MD Signature Date/Time: 07/11/2019/11:42:17 AM    Final    ECHOCARDIOGRAM LIMITED  Result Date: 07/14/2019    ECHOCARDIOGRAM LIMITED REPORT   Patient Name:   Edward Mendoza Date of Exam: 07/14/2019 Medical Rec #:  [id number]      Height:       74.0 in Accession #:    [id number]     Weight:       238.8 lb Date  of Birth:  January 22, 1971      BSA:          2.343 m Patient Age:    48 years       BP:           111/85 mmHg Patient Gender: M              HR:           74 bpm. Exam Location:  Inpatient Procedure: Limited Echo, Limited Color Doppler and Cardiac Doppler Indications:    cardiac arrest  History:        Patient has prior history of Echocardiogram examinations, most                 recent 07/11/2019.  Sonographer:    Delcie Roch Referring Phys: [id number] KENNETH C HILTY  Sonographer Comments: Echo performed with patient supine and on artificial respirator. IMPRESSIONS  1. Left  ventricular ejection fraction, by estimation, is 20 to 25%. The left ventricle has severely decreased function. There is severe hypokinesis of the left ventricular, entire anteroseptal wall, anterior wall and apical segment. There is moderate hypokinesis of the left ventricular, mid-apical inferior wall. Comparison(s): Prior images reviewed side by side. Changes from prior study are noted. 07/11/19: LVEF 15%. FINDINGS  Left Ventricle: Left ventricular ejection fraction, by estimation, is 20 to 25%. The left ventricle has severely decreased function. Severe hypokinesis of the left ventricular, entire anteroseptal wall, anterior wall and apical segment. Moderate hypokinesis of the left ventricular, mid-apical inferior wall. Pericardium: Trivial pericardial effusion is present. The pericardial effusion is posterior to the left ventricle. Venous: IVC assessment for right atrial pressure unable to be performed due to mechanical ventilation.  LEFT VENTRICLE PLAX 2D LVIDd:         5.30 cm      Diastology LVIDs:         3.90 cm      LV e' lateral:   8.70 cm/s LV PW:         1.00 cm      LV E/e' lateral: 5.7 LV IVS:        0.90 cm      LV e' medial:    6.96 cm/s LVOT diam:     2.30 cm      LV E/e' medial:  7.1 LV SV:         44 LV SV Index:   19 LVOT Area:     4.15 cm  LV Volumes (MOD) LV vol d, MOD A2C: 108.0 ml LV vol d, MOD A4C: 96.2 ml LV vol s, MOD A2C: 75.7 ml LV vol s, MOD A4C: 65.9 ml LV SV MOD A2C:     32.3 ml LV SV MOD A4C:     96.2 ml LV SV MOD BP:      34.4 ml LEFT ATRIUM         Index LA diam:    4.10 cm 1.75 cm/m  AORTIC VALVE LVOT Vmax:   62.50 cm/s LVOT Vmean:  44.500 cm/s LVOT VTI:    0.106 m  AORTA Ao Root diam: 3.20 cm MITRAL VALVE MV Area (PHT): 4.68 cm    SHUNTS MV Decel Time: 162 msec    Systemic VTI:  0.11 m MV E velocity: 49.50 cm/s  Systemic Diam: 2.30 cm MV A velocity: 61.10 cm/s MV E/A ratio:  0.81 Zoila Shutter MD Electronically signed by Zoila Shutter MD Signature Date/Time: 07/14/2019/12:02:06  PM    Final  [place] EKG SITE RITE  Result Date: 07/17/2019 If Site Rite image not attached, placement could not be confirmed due to current cardiac rhythm.  Disposition   Pt is being discharged home today in good condition.  Follow-up Plans & Appointments    Follow-up Information     [facility] Follow up.   Specialty: Cardiology Why: The office will call to schedule hospital follow-up. Please call to make an appointment if you do not recieve a call within 2 days.  Contact information: [address] [id number] mc [address] [place]        Leonie Man, MD Follow up.   Specialty: Cardiology Why: The office will call to schedule hospital follow-up.  Contact information: [address]  [place] [phone]           Discharge Instructions     (HEART FAILURE PATIENTS) Call MD:  Anytime you have any of the following symptoms: 1) 3 pound weight gain in 24 hours or 5 pounds in 1 week 2) shortness of breath, with or without a dry hacking cough 3) swelling in the hands, feet or stomach 4) if you have to sleep on extra pillows at night in order to breathe.   Complete by: As directed    Amb Referral to Cardiac Rehabilitation   Complete by: As directed    Referring to [place] CRP 2   Diagnosis:  STEMI Coronary Stents     After initial evaluation and assessments completed: Virtual Based Care may be provided alone or in conjunction with Phase 2 Cardiac Rehab based on patient barriers.: Yes   Avoid straining   Complete by: As directed    Call MD for:  difficulty breathing, headache or visual disturbances   Complete by: As directed    Call MD for:  extreme fatigue   Complete by: As directed    Call MD for:  hives   Complete by: As directed    Call MD for:  persistant dizziness or light-headedness   Complete by: As directed    Call MD for:  persistant nausea and  vomiting   Complete by: As directed    Call MD for:  redness, tenderness, or signs of infection (pain, swelling, redness, odor or green/yellow discharge around incision site)   Complete by: As directed    Call MD for:  severe uncontrolled pain   Complete by: As directed    Call MD for:  temperature >100.4   Complete by: As directed    Diet - low sodium heart healthy   Complete by: As directed    Discharge instructions   Complete by: As directed    Please stop Aspirin 4/11.   Heart Failure patients record your daily weight using the same scale at the same time of day   Complete by: As directed    Increase activity slowly   Complete by: As directed    STOP any activity that causes chest pain, shortness of breath, dizziness, sweating, or exessive weakness   Complete by: As directed        Discharge Medications   Allergies as of 07/22/2019   No Known Allergies      Medication List     TAKE these medications    amiodarone 200 MG tablet Commonly known as: PACERONE Take 1 tablet (200 mg total) by mouth daily. Start taking on: July 23, 2019   apixaban 5 MG Tabs tablet Commonly known  asEverlene Balls Take 1 tablet (5 mg total) by mouth 2 (two) times daily.   aspirin 81 MG chewable tablet Chew 1 tablet (81 mg total) by mouth daily. Stop 4/11 Start taking on: July 23, 2019   atorvastatin 40 MG tablet Commonly known as: Lipitor Take 1 tablet (40 mg total) by mouth daily.   carvedilol 3.125 MG tablet Commonly known as: Coreg Take 1 tablet (3.125 mg total) by mouth 2 (two) times daily.   citalopram 10 MG tablet Commonly known as: CeleXA Take 1 tablet (10 mg total) by mouth daily.   clopidogrel 75 MG tablet Commonly known as: PLAVIX Take 1 tablet (75 mg total) by mouth daily. Start taking on: July 23, 2019   digoxin 0.125 MG tablet Commonly known as: LANOXIN Place 1 tablet (0.125 mg total) into feeding tube daily. Start taking on: July 23, 2019   furosemide 20 MG  tablet Commonly known as: LASIX Take 1 tablet (20 mg total) by mouth daily as needed. Use as needed for lower leg swelling   losartan 25 MG tablet Commonly known as: Cozaar Take 0.5 tablets (12.5 mg total) by mouth daily.   pantoprazole 40 MG tablet Commonly known as: PROTONIX Take 1 tablet (40 mg total) by mouth daily. Start taking on: July 23, 2019   spironolactone 25 MG tablet Commonly known as: ALDACTONE Take 1 tablet (25 mg total) by mouth daily. Start taking on: July 23, 2019           Outstanding Labs/Studies   None  Duration of Discharge Encounter   Greater than 30 minutes including physician time.  Signed, Cadence David Stall, PA-C 07/22/2019, 1:01 PM  Patient seen and examined with the above-signed Advanced Practice Provider and/or Housestaff. I personally reviewed laboratory data, imaging studies and relevant notes. I independently examined the patient and formulated the important aspects of the plan. I have edited the note to reflect any of my changes or salient points. I have personally discussed the plan with the patient and/or family.  Agree with above. He is ready for d/c. Will f/u in HF Clinic. Watch for hypotension.  Will need recheck of LFTs. Drop ASA in 30 days.   Arvilla Meres, MD  9:01 PM

## 2019-07-22 NOTE — Plan of Care (Signed)
  Problem: Education: Goal: Understanding of cardiac disease, CV risk reduction, and recovery process will improve Outcome: Adequate for Discharge Goal: Understanding of medication regimen will improve Outcome: Adequate for Discharge Goal: Individualized Educational Video(s) Outcome: Adequate for Discharge   Problem: Activity: Goal: Ability to tolerate increased activity will improve Outcome: Adequate for Discharge   Problem: Cardiac: Goal: Ability to achieve and maintain adequate cardiopulmonary perfusion will improve Outcome: Adequate for Discharge Goal: Vascular access site(s) Level 0-1 will be maintained Outcome: Adequate for Discharge   Problem: Health Behavior/Discharge Planning: Goal: Ability to safely manage health-related needs after discharge will improve Outcome: Adequate for Discharge   Problem: Education: Goal: Knowledge of General Education information will improve Description: Including pain rating scale, medication(s)/side effects and non-pharmacologic comfort measures Outcome: Adequate for Discharge   Problem: Health Behavior/Discharge Planning: Goal: Ability to manage health-related needs will improve Outcome: Adequate for Discharge   Problem: Clinical Measurements: Goal: Ability to maintain clinical measurements within normal limits will improve Outcome: Adequate for Discharge Goal: Will remain free from infection Outcome: Adequate for Discharge Goal: Diagnostic test results will improve Outcome: Adequate for Discharge Goal: Respiratory complications will improve Outcome: Adequate for Discharge Goal: Cardiovascular complication will be avoided Outcome: Adequate for Discharge   Problem: Activity: Goal: Risk for activity intolerance will decrease Outcome: Adequate for Discharge   Problem: Nutrition: Goal: Adequate nutrition will be maintained Outcome: Adequate for Discharge   Problem: Coping: Goal: Level of anxiety will decrease Outcome: Adequate  for Discharge   Problem: Elimination: Goal: Will not experience complications related to bowel motility Outcome: Adequate for Discharge Goal: Will not experience complications related to urinary retention Outcome: Adequate for Discharge   Problem: Pain Managment: Goal: General experience of comfort will improve Outcome: Adequate for Discharge   Problem: Safety: Goal: Ability to remain free from injury will improve Outcome: Adequate for Discharge   Problem: Skin Integrity: Goal: Risk for impaired skin integrity will decrease Outcome: Adequate for Discharge   

## 2019-07-23 ENCOUNTER — Telehealth (HOSPITAL_COMMUNITY): Payer: Self-pay | Admitting: Cardiology

## 2019-07-23 ENCOUNTER — Telehealth: Payer: Self-pay | Admitting: Cardiology

## 2019-07-23 NOTE — Telephone Encounter (Signed)
Left message to call back  

## 2019-07-23 NOTE — Telephone Encounter (Signed)
-----   Message from Cadence David Stall, PA-C sent at 07/22/2019  1:15 PM EDT ----- Regarding: hospital f/u This patient is being discharged today. Saw Dr. Gala Romney in the hospital s/p Cardiac/Vfib arrest and STEMI s/p stent to LAD. Needs follow-up in the heart failure clinic in 1-2 weeks please. Thanks!

## 2019-07-23 NOTE — Telephone Encounter (Signed)
Spoke to wife- she states digoxin medication instructions states 1 tablet into feeding tube daily.   Patient does not have feeding tube.  Advised to take 1 tablet daily PO.    She verbalized understanding.   Wife also states she spoke to [employer] and they would cover [facility] services but need MD to send request.    He is currently participating in outpatient OT but she believes patient would benefit from [facility] for a few weeks until he gets strength back.   States he was in ICU and is very weak and sore due to chest compressions.    Advised I would send message to primary cardiologist to review and advise on [facility].   Advised this may need to be addressed at FTF visit at follow up.   Wife is aware and verbalized understanding.

## 2019-07-23 NOTE — Telephone Encounter (Signed)
  According to the PT note he did qualify for HHPT.   HF Team - can we set this up for him? Thanks -db

## 2019-07-23 NOTE — Telephone Encounter (Signed)
So this gentleman was in the hospital for quite a long time on the heart failure service, Dr. Teena Dunk on [facility] exclusively.  He was just discharged yesterday.  I would probably defer this question to Dr. Gala Romney as opposed to me.  Not sure what the plan was.   Bryan Lemma, MD

## 2019-07-23 NOTE — Telephone Encounter (Signed)
HFU 08/07/19 @230  Pt aware Parking instructions given to patient

## 2019-07-23 NOTE — Telephone Encounter (Signed)
New message   Patient has a question about digoxin (LANOXIN) 0.125 MG tablet. Please advise about the direction for this medication. Please call.

## 2019-07-24 NOTE — Telephone Encounter (Signed)
Spoke w/pt's wife about HH ref, she states pt is sch for cardiac rehab orientation at [facility]. Advised insurance will not cover both. Pt will go to rehab and if he has a hard time and feels HHPT would be better for now they will call us back.

## 2019-08-02 ENCOUNTER — Telehealth: Payer: Self-pay | Admitting: Cardiology

## 2019-08-02 NOTE — Telephone Encounter (Signed)
Lets have him reduce Losartan & Cleda Daub to 1/2 tab & take Losartan @ dinner with Cleda Daub in AM.  We may need to stop Coreg (with Amiodarone providing some BB effect).  Will determine based upon HR & BP.  Bryan Lemma, MD

## 2019-08-02 NOTE — Telephone Encounter (Signed)
New message     Pt had a heart attack recently requiring a stent.  Patient started cardiac rehab on Monday of this week but the nurse told him that his bp was dropping a bit for exercise and thought that he may have started rehab too early.  Wife said pt has been c/o dizziness, and light headedness when going from a seated to a standing position this week.  Today he started c/o "hot flashes" like symptoms.  He is scheduled to have a follow up soon but should he be seen sooner or do something about these symptoms?

## 2019-08-02 NOTE — Telephone Encounter (Signed)
Returned call to patient's wife. She reports patient has started cardiac rehab and has had low BP and c/o dizziness/lightheadedness. He only went to rehab once and will wait to return after he sees cardiology providers for hospital follow. Home BPs have been 100/69 and 90/70 - not tracked consistently. Patient also is having hot flash type symptoms - will fluctuate between hot and cold.   Advised wife to check BP/pulse at least twice daily and log daily weights (which they are doing) and bring to HF clinic appt on 4/6 and appt at [facility] on 4/7.   Explained that he is on lowest dose of most of his cardiac medications and explained they are cardio-protective medications, given his complicated recent hospital course. He is not having to use lasix PRN. Verified all other medications.   Will send message to Dr. Freda Munro NP, and [facility] for med review for potential side effects of hot/cold symptoms

## 2019-08-02 NOTE — Telephone Encounter (Signed)
Wife called with MD advice. Med list updated. Already on losartan 25 (half tab = 12.5mg ) Will bring log of BP/HR to next visit(s)

## 2019-08-07 ENCOUNTER — Ambulatory Visit (HOSPITAL_COMMUNITY)
Admission: RE | Admit: 2019-08-07 | Discharge: 2019-08-07 | Disposition: A | Discharge status: Home / Self Care | Payer: Managed Care, Other (non HMO) | Source: Ambulatory Visit | Attending: Adult Health | Admitting: Adult Health

## 2019-08-07 ENCOUNTER — Other Ambulatory Visit: Payer: Self-pay

## 2019-08-07 ENCOUNTER — Encounter (HOSPITAL_COMMUNITY): Payer: Self-pay

## 2019-08-07 VITALS — BP 110/64 | HR 65 | Wt 196.4 lb

## 2019-08-07 DIAGNOSIS — R0683 Snoring: Secondary | ICD-10-CM | POA: Diagnosis not present

## 2019-08-07 DIAGNOSIS — I5022 Chronic systolic (congestive) heart failure: Secondary | ICD-10-CM | POA: Insufficient documentation

## 2019-08-07 DIAGNOSIS — I2510 Atherosclerotic heart disease of native coronary artery without angina pectoris: Secondary | ICD-10-CM

## 2019-08-07 DIAGNOSIS — I469 Cardiac arrest, cause unspecified: Secondary | ICD-10-CM | POA: Diagnosis not present

## 2019-08-07 DIAGNOSIS — I4901 Ventricular fibrillation: Secondary | ICD-10-CM | POA: Diagnosis not present

## 2019-08-07 LAB — CBC
HCT: 39 % (ref 39.0–52.0)
Hemoglobin: 12.4 g/dL — ABNORMAL LOW (ref 13.0–17.0)
MCH: 30.3 pg (ref 26.0–34.0)
MCHC: 31.8 g/dL (ref 30.0–36.0)
MCV: 95.4 fL (ref 80.0–100.0)
Platelets: 379 10*3/uL (ref 150–400)
RBC: 4.09 MIL/uL — ABNORMAL LOW (ref 4.22–5.81)
RDW: 12.8 % (ref 11.5–15.5)
WBC: 6.7 10*3/uL (ref 4.0–10.5)
nRBC: 0 % (ref 0.0–0.2)

## 2019-08-07 LAB — COMPREHENSIVE METABOLIC PANEL
ALT: 60 U/L — ABNORMAL HIGH (ref 0–44)
AST: 23 U/L (ref 15–41)
Albumin: 3 g/dL — ABNORMAL LOW (ref 3.5–5.0)
Alkaline Phosphatase: 149 U/L — ABNORMAL HIGH (ref 38–126)
Anion gap: 9 (ref 5–15)
BUN: 17 mg/dL (ref 6–20)
CO2: 21 mmol/L — ABNORMAL LOW (ref 22–32)
Calcium: 8.8 mg/dL — ABNORMAL LOW (ref 8.9–10.3)
Chloride: 107 mmol/L (ref 98–111)
Creatinine, Ser: 1.16 mg/dL (ref 0.61–1.24)
GFR calc Af Amer: >60 mL/min (ref >60)
GFR calc non Af Amer: >60 mL/min (ref >60)
Glucose, Bld: 91 mg/dL (ref 70–99)
Potassium: 4.1 mmol/L (ref 3.5–5.1)
Sodium: 137 mmol/L (ref 135–145)
Total Bilirubin: 0.4 mg/dL (ref 0.3–1.2)
Total Protein: 6.5 g/dL (ref 6.5–8.1)

## 2019-08-07 LAB — TSH: TSH: 3.133 u[IU]/mL (ref 0.350–4.500)

## 2019-08-07 LAB — BRAIN NATRIURETIC PEPTIDE: B Natriuretic Peptide: 267.9 pg/mL — ABNORMAL HIGH (ref 0.0–100.0)

## 2019-08-07 NOTE — Progress Notes (Signed)
Cardiology Clinic Note   Patient Name: Edward Mendoza Date of Encounter: 08/08/2019  Primary Care Provider:  Lise Auer, MD Primary Cardiologist:  Bryan Lemma, MD  Patient Profile    Edward Mendoza. [place] 49 year old male presents today for follow-up status post STEMI with cardiac arrest and acute systolic heart failure.  Past Medical History    Past Medical History:  Diagnosis Date  . Smoker 07/13/2019   Past Surgical History:  Procedure Laterality Date  . ARTERIAL LINE INSERTION N/A 07/11/2019   Procedure: ARTERIAL LINE INSERTION;  Surgeon: Marykay Lex, MD;  Location: [facility] INVASIVE CV LAB;  Service: Cardiovascular;  Laterality: N/A;  . CARDIOVERSION N/A 07/21/2019   Procedure: CARDIOVERSION;  Surgeon: Dolores Patty, MD;  Location: [facility] ENDOSCOPY;  Service: Cardiovascular;  Laterality: N/A;  . CORONARY STENT INTERVENTION N/A 07/11/2019   Procedure: CORONARY STENT INTERVENTION;  Surgeon: Marykay Lex, MD;  Location: [facility] INVASIVE CV LAB;  Service: Cardiovascular;  Laterality: N/A;  . CORONARY/GRAFT ACUTE MI REVASCULARIZATION N/A 07/11/2019   Procedure: Coronary/Graft Acute MI Revascularization;  Surgeon: Marykay Lex, MD;  Location: [facility] INVASIVE CV LAB;  Service: Cardiovascular;  Laterality: N/A;  . RIGHT/LEFT HEART CATH AND CORONARY ANGIOGRAPHY N/A 07/11/2019   Procedure: RIGHT/LEFT HEART CATH AND CORONARY ANGIOGRAPHY;  Surgeon: Marykay Lex, MD;  Location: [facility] INVASIVE CV LAB;  Service: Cardiovascular;  Laterality: N/A;  . TEE WITHOUT CARDIOVERSION N/A 07/21/2019   Procedure: TRANSESOPHAGEAL ECHOCARDIOGRAM (TEE);  Surgeon: Dolores Patty, MD;  Location: [facility] ENDOSCOPY;  Service: Cardiovascular;  Laterality: N/A;    Allergies  No Known Allergies  History of Present Illness    [name] has a past medical history of coronary artery disease, STEMI, ventricular fibrillation arrest 07/11/2019 and CVAs.  He presented to the emergency department on 07/11/2019 via  EMS.  He was noted to have acute STEMI which was complicated by witnessed V. fib arrest.  He had contacted the EMS due to chest pain and had immediate resuscitation with his V. fib arrest.  He was noted to have had 6 external defibrillations and 2 mg of epi.  He was also intubated.  He received a total of 45 minutes of ACLS before ROSC was obtained.  He was taken to the Cath Lab.  His cardiac catheterization showed a 99% proximal LAD occlusion and he received a DES x1 to his LAD.  No other significant disease was noted.  He was placed on aspirin and Brilinta.  He was not placed on statin secondary to shock liver.  His beta-blocker was held in the setting of low output heart failure.  His Brilinta was stopped and Plavix was started with an addition of Eliquis for atrial flutter.  He recovered and worked with cardiac rehab and was able to ambulate up and down the halls with assistance of a Rollator walker.  He was noted to have intermittent pleuritic symptoms as well as chest wall soreness from his CPR.  A TEE DCCV on 07/21/2019 successfully converted him to normal sinus rhythm and showed a EF of 30-35%.  He was discharged home on 07/22/2019.  He was seen by Tonye Becket, NP/heart failure service in follow-up on 08/07/2019.  During that time he complained of left thumb and index finger numbness.  He denied shortness of breath, PND, and orthopnea.  His systolic blood pressure at home was reported to be in 90s.  He denied chest pain.  He had been walking 15 minutes 2 times per day and  reported being tired after walking.  He denied fever chills.  His weight at home range between 199--192 pounds.  He was compliant with his medications and had started cardiac rehab at [facility] however, his rehab was stopped due to hypotension.  He presents the clinic today and states he still has some chest soreness from chest compressions.  He presented to cardiac rehab but due to hypotension he stopped and we elected to have his  follow-up visits before restarting cardiac rehab.  He continues to walk 15 minutes 2 times per day.  He has stopped smoking, previously smoked a pack per day.  He has been following a low-sodium diet and weighing daily.  He is concerned about return to work and would like a note explaining his restrictions.  At this time I will wait for him to start cardiac rehab and allow time for cardiac evaluation with increased physical activity.  I will let advanced heart failure team address his return to work in 2 weeks.  Today he  denies chest pain, shortness of breath, lower extremity edema, fatigue, palpitations, melena, hematuria, hemoptysis, diaphoresis, weakness, presyncope, syncope, orthopnea, and PND.   Home Medications    Prior to Admission medications   Medication Sig Start Date End Date Taking? Authorizing Provider  amiodarone (PACERONE) 200 MG tablet Take 1 tablet (200 mg total) by mouth daily. 07/23/19   Furth, Cadence H, PA-C  apixaban (ELIQUIS) 5 MG TABS tablet Take 1 tablet (5 mg total) by mouth 2 (two) times daily. 07/22/19   Furth, Cadence H, PA-C  aspirin 81 MG chewable tablet Chew 1 tablet (81 mg total) by mouth daily. Stop 4/11 07/23/19   Furth, Cadence H, PA-C  atorvastatin (LIPITOR) 40 MG tablet Take 1 tablet (40 mg total) by mouth daily. 07/22/19 07/21/20  Furth, Cadence H, PA-C  carvedilol (COREG) 3.125 MG tablet Take 1 tablet (3.125 mg total) by mouth 2 (two) times daily. 07/22/19 07/21/20  Furth, Cadence H, PA-C  citalopram (CELEXA) 10 MG tablet Take 1 tablet (10 mg total) by mouth daily. 07/22/19 07/21/20  Furth, Cadence H, PA-C  clopidogrel (PLAVIX) 75 MG tablet Take 1 tablet (75 mg total) by mouth daily. 07/23/19   Furth, Cadence H, PA-C  digoxin (LANOXIN) 0.125 MG tablet Take 0.125 mg by mouth daily.    [provider]  furosemide (LASIX) 20 MG tablet Take 1 tablet (20 mg total) by mouth daily as needed. Use as needed for lower leg swelling Patient not taking: Reported on  08/07/2019 07/22/19   Furth, Cadence H, PA-C  losartan (COZAAR) 25 MG tablet Take 0.5 tablets (12.5 mg total) by mouth daily. 07/22/19 07/21/20  Furth, Cadence H, PA-C  pantoprazole (PROTONIX) 40 MG tablet Take 1 tablet (40 mg total) by mouth daily. 07/23/19   Furth, Cadence H, PA-C  spironolactone (ALDACTONE) 25 MG tablet Take 12.5 mg by mouth daily.    [provider]    Family History    No family history on file. has no family status information on file.   Social History    Social History   Socioeconomic History  . Marital status: Married    Spouse name: Not on file  . Number of children: Not on file  . Years of education: Not on file  . Highest education level: Not on file  Occupational History  . Occupation: Writer: [employer]  Tobacco Use  . Smoking status: Former Smoker    Packs/day: 1.00  Years: 20.00    Pack years: 20.00    Types: Cigarettes    Start date: 2000    Quit date: 07/11/2019    Years since quitting: 0.0  . Smokeless tobacco: Never Used  Substance and Sexual Activity  . Alcohol use: Not Currently  . Drug use: Not Currently  . Sexual activity: Yes  Other Topics Concern  . Not on file  Social History Narrative  . Not on file   Social Determinants of Health   Financial Resource Strain:   . Difficulty of Paying Living Expenses:   Food Insecurity:   . Worried About Charity fundraiser in the Last Year:   . Arboriculturist in the Last Year:   Transportation Needs: No Transportation Needs  . Lack of Transportation (Medical): No  . Lack of Transportation (Non-Medical): No  Physical Activity: Sufficiently Active  . Days of Exercise per Week: 7 days  . Minutes of Exercise per Session: 30 min  Stress:   . Feeling of Stress :   Social Connections:   . Frequency of Communication with Friends and Family:   . Frequency of Social Gatherings with Friends and Family:   . Attends Religious Services:   . Active Member of  Clubs or Organizations:   . Attends Archivist Meetings:   Marland Kitchen Marital Status:   Intimate Partner Violence:   . Fear of Current or Ex-Partner:   . Emotionally Abused:   Marland Kitchen Physically Abused:   . Sexually Abused:      Review of Systems    General:  No chills, fever, night sweats or weight changes.  Cardiovascular:  No chest pain, dyspnea on exertion, edema, orthopnea, palpitations, paroxysmal nocturnal dyspnea. Dermatological: No rash, lesions/masses Respiratory: No cough, dyspnea Urologic: No hematuria, dysuria Abdominal:   No nausea, vomiting, diarrhea, bright red blood per rectum, melena, or hematemesis Neurologic:  No visual changes, wkns, changes in mental status. All other systems reviewed and are otherwise negative except as noted above.  Physical Exam    VS:  BP 100/62 (BP Location: Left Arm, Patient Position: Sitting, Cuff Size: Normal)   Pulse 74   Temp (!) 97.3 F (36.3 C)   Ht 6\' 1"  (1.854 m)   Wt 197 lb 12.8 oz (89.7 kg)   SpO2 98%   BMI 26.10 kg/m  , BMI Body mass index is 26.1 kg/m. GEN: Well nourished, well developed, in no acute distress. HEENT: normal. Neck: Supple, no JVD, carotid bruits, or masses. Cardiac: RRR, no murmurs, rubs, or gallops. No clubbing, cyanosis, edema.  Radials/DP/PT 2+ and equal bilaterally.  Respiratory:  Respirations regular and unlabored, clear to auscultation bilaterally. GI: Soft, nontender, nondistended, BS + x 4. MS: no deformity or atrophy. Skin: warm and dry, no rash.  Right groin cardiac catheterization site clean dry intact no drainage  neuro:  Strength and sensation are intact. Psych: Normal affect.  Accessory Clinical Findings    ECG personally reviewed by me today-none today.  EKG 08/07/2019 Normal sinus rhythm anterior infarct undetermined age 15 bpm no ST or T wave deviation.  Echocardiogram 07/14/2019 IMPRESSIONS    1. Left ventricular ejection fraction, by estimation, is 20 to 25%. The  left  ventricle has severely decreased function. There is severe  hypokinesis of the left ventricular, entire anteroseptal wall, anterior  wall and apical segment. There is moderate  hypokinesis of the left ventricular, mid-apical inferior wall.   Comparison(s): Prior images reviewed side by side. Changes from prior  study are noted. 07/11/19: LVEF 15%.   Cardiac catheterization 07/11/2019  There is moderate left ventricular systolic dysfunction.  LV end diastolic pressure is moderately elevated.  The left ventricular ejection fraction is 35-45% by visual estimate.  There is no aortic valve stenosis.  Prox LAD to Mid LAD lesion is 95% stenosed.  Post intervention, there is a 0% residual stenosis.  A drug-eluting stent was successfully placed using a STENT RESOLUTE ONYX 3.5X18.  TEE 07/22/2019 EF 30-35%  Diagnostic Dominance: Right  Intervention     Assessment & Plan   1.  STEMI-anterior STEMI complicated by V. fib arrest 07/11/2019.  Cardiac catheterization showed single-vessel CAD with proximal LAD 99% stenosis.  He received DES x1 to his LAD. Continue aspirin until 08/12/2019 Continue Plavix 75 mg daily Continue atorvastatin 40 mg daily Heart healthy low-sodium diet Increase physical activity as tolerated Out of work note provided-reevaluate 2 weeks after starting cardiac rehab  Chronic systolic heart failure-TEE 3/20 showed an EF improvement to 30-35%.  Underwent successful DCCV to NSR 07/21/2019.  Followed by advanced heart failure team Continue carvedilol 3.125 twice daily Continue losartan 12.5 mg daily at bedtime-plan to switch to [facility] when blood pressure tolerates. Continue spironolactone 12.5 mg daily  V. fib arrest-in the setting of acute myocardial infarction, anterior STEMI Follow-up echocardiogram after medication has been optimized  Hyperlipidemia-07/11/2019: Cholesterol 226; HDL 34; LDL Cholesterol 132; VLDL 60 07/15/2019: Triglycerides 208 Continue  atorvastatin 40 mg daily Heart healthy low-sodium high-fiber diet Increase physical activity as tolerated Repeat lipid panel and LFTs in 2 months  Disposition: Follow-up with Dr. [name] in 3 months and Advanced Heart Failure Team as scheduled.  Thomasene Ripple. Mali Eppard NP-C       [facility] [address] Office [phone] Fax [phone]

## 2019-08-07 NOTE — Patient Instructions (Addendum)
It was great to see you today! No medication changes are needed at this time.   Labs today We will only contact you if something comes back abnormal or we need to make some changes. Otherwise no news is good news!  Your provider has recommended that you have a home sleep study.  [employer] is the company that does these test.  They will contact you by phone and must speak with you before they can ship the equipment.  Once they have spoken with you they will send the equipment right to your home with instructions on how to set it up.  Once you have completed the test you just dispose of the equipment, the information is automatically uploaded to [place] via [employer] technology.  IF you have any questions or issues with the equipment please call the company directly at [phone].  If your test is positive for sleep apnea and you need a home CPAP machine you will be contacted by Dr Norris Cross office [facility]) to set this up.   Your physician recommends that you schedule a follow-up appointment in: 2 weeks  in the Advanced Practitioners (PA/NP) Clinic   Do the following things EVERYDAY: 1) Weigh yourself in the morning before breakfast. Write it down and keep it in a log. 2) Take your medicines as prescribed 3) Eat low salt foods--Limit salt (sodium) to 2000 mg per day.  4) Stay as active as you can everyday 5) Limit all fluids for the day to less than 2 liters  At the Advanced Heart Failure Clinic, you and your health needs are our priority. As part of our continuing mission to provide you with exceptional heart care, we have created designated Provider Care Teams. These Care Teams include your primary Cardiologist (physician) and Advanced Practice Providers (APPs- Physician Assistants and Nurse Practitioners) who all work together to provide you with the care you need, when you need it.   You may see any of the following providers on your designated Care Team at your next follow up: Marland Kitchen Dr  Arvilla Meres . Dr Marca Ancona . Tonye Becket, NP . Robbie Lis, PA . Karle Plumber, PharmD   Please be sure to bring in all your medications bottles to every appointment.

## 2019-08-07 NOTE — Progress Notes (Addendum)
Patient Name: Edward Mendoza        DOB: Dec 09, 1970      Height: 6'1"    Weight:196lb  Office Name:Advanced Heart Failure Clinic         Referring Provider:  Today's Date:08/07/19  Date:   STOP BANG RISK ASSESSMENT S (snore) Have you been told that you snore?     YES   T (tired) Are you often tired, fatigued, or sleepy during the day?   YES  O (obstruction) Do you stop breathing, choke, or gasp during sleep? NO   P (pressure) Do you have or are you being treated for high blood pressure? YES   B (BMI) Is your body index greater than 35 kg/m? NO   A (age) Are you 68 years old or older? NO   N (neck) Do you have a neck circumference greater than 16 inches?   YES   G (gender) Are you a male? YES   TOTAL STOP/BANG "YES" ANSWERS                                                                        For Office Use Only              Procedure Order Form    YES to 3+ Stop Bang questions OR two clinical symptoms - patient qualifies for [employer] (CPT 95800)     Submit: This Form + Patient Face Sheet + Clinical Note via Cloud[employer] or Fax: [phone]         Clinical Notes: Will consult Sleep Specialist and refer for management of therapy due to patient increased risk of Sleep Apnea. Ordering a sleep study due to the following two clinical symptoms: Excessive daytime sleepiness G47.10/ History of high blood pressure R03.0    I understand that I am proceeding with a home sleep apnea test as ordered by my treating physician. I understand that untreated sleep apnea is a serious cardiovascular risk factor and it is my responsibility to perform the test and seek management for sleep apnea. I will be contacted with the results and be managed for sleep apnea by a local sleep physician. I will be receiving equipment and further instructions from [facility]. I shall promptly ship back the equipment via the included mailing label. I understand my insurance will be billed for the test and as the  patient I am responsible for any insurance related out-of-pocket costs incurred. I have been provided with written instructions and can call for additional video or telephonic instruction, with 24-hour availability of qualified personnel to answer any questions: Patient Help Desk [phone].  Patient Signature ______________________________________________________   Date______________________ Patient Telemedicine Verbal Consent

## 2019-08-07 NOTE — Progress Notes (Signed)
PCP: Cardiology: Dr [name] Primary HF Cardiologist: Dr Gala Romney   HPI: Edward Mendoza is a 49 year old with history of CAD, STEMI, VF arrest 07/11/19,and CVAs.   On 07/11/19 presented with acute STEMI complicated by witnessed VF arrest who was brought to the ED 07/11/19. The patient had called EMS for stuttering chest pain for the last several days. He subsequently had a witnessed VF arrest with immediate resuscitation. He received a total of 6 external defibrillation and 2mg  epi and had emergency airway placed. He had a total of 40-45 minutes of ACLS before ROSC which was shortly after arrival to the ED. In the ED EKG demonstrated STEMI and he was transferred emergently to [facility] hospital for cardiac cath. LHC showed 99% proximal LAD occlusion treated with PCI/DES to LAD. No other significant disease. He was started on DAPT with Aspirin and Brilinta. No statin started secondary to shock liver but later started as he improved. BB held in the setting of low output heart failure. Brilinta was stopped and plavix started with the addition of Eliquis (for aflutter). Patient worked with cardiac rehab and was able to ambulate up and down the halls with rollator. Did have intermittent pleuritic symptoms as well as chest wall soreness from CPR. TEE DC-CV 3/20 with conversion to NSR EF 30-35%. Discharged to home on 3/21/ 21.   Today he returns for HF follow up.Overall feeling much better. Complaining of left thumb and index finger numbness. Denies SOB/PND/Orthopnea. SBP at home has been in the 90s. No chest pain. Walking 15 minutes twice a day. Tired after walking. Appetite ok. No fever or chills. Weight at home 199-->192 pounds. Taking all medications. Started cardiac rehab last week at [facility] but had low blood pressure so this was stopped. Lives with his wife and children.   ECHO   07/14/19 EF 20-25%   LHC 07/11/19    Anterior STEMI complicated by VF arrest.    Severe single-vessel CAD with proximal LAD 99%  thrombotic occlusion (culprit lesion).  Otherwise minimal disease elsewhere.  Successful DES PCI of proximal LAD using resolute Onyx DES 3.5 x 18 mm postdilated to 4.0 mm.  Systolic hypotension with borderline cardiogenic shock with systolic pressures in the 90s over 60s requiring Levophed at 10 mcg/min. -->  Cardiac output-index 4.77-2.04; cardiac power 0.8.  No evidence of pulmonary hypertension  ROS: All systems negative except as listed in HPI, PMH and Problem List.  SH:  Social History   Socioeconomic History  . Marital status: Married    Spouse name: Not on file  . Number of children: Not on file  . Years of education: Not on file  . Highest education level: Not on file  Occupational History  . 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 Smoking status: Former Smoker    Packs/day: 1.00    Years: 20.00    Pack years: 20.00    Types: Cigarettes    Start date: 2000    Quit date: 07/11/2019    Years since quitting: 0.0  . Smokeless tobacco: Never Used  Substance and Sexual Activity  . Alcohol use: Not Currently  . Drug use: Not Currently  . Sexual activity: Yes  Other Topics Concern  . Not on file  Social History Narrative  . Not on file   Social Determinants of Health   Financial Resource Strain:   . Difficulty of Paying Living Expenses:   Food Insecurity:   . Worried About Running  Out of Food in the Last Year:   . Rose in the Last Year:   Transportation Needs: No Transportation Needs  . Lack of Transportation (Medical): No  . Lack of Transportation (Non-Medical): No  Physical Activity: Sufficiently Active  . Days of Exercise per Week: 7 days  . Minutes of Exercise per Session: 30 min  Stress:   . Feeling of Stress :   Social Connections:   . Frequency of Communication with Friends and Family:   . Frequency of Social Gatherings with Friends and Family:   . Attends Religious Services:   . Active Member of Clubs or  Organizations:   . Attends Archivist Meetings:   Marland Kitchen Marital Status:   Intimate Partner Violence:   . Fear of Current or Ex-Partner:   . Emotionally Abused:   Marland Kitchen Physically Abused:   . Sexually Abused:     FH: No family history on file.  Past Medical History:  Diagnosis Date  . Smoker 07/13/2019    Current Outpatient Medications  Medication Sig Dispense Refill  . amiodarone (PACERONE) 200 MG tablet Take 1 tablet (200 mg total) by mouth daily. 30 tablet 5  . apixaban (ELIQUIS) 5 MG TABS tablet Take 1 tablet (5 mg total) by mouth 2 (two) times daily. 180 tablet 4  . aspirin 81 MG chewable tablet Chew 1 tablet (81 mg total) by mouth daily. Stop 4/11 30 tablet 3  . atorvastatin (LIPITOR) 40 MG tablet Take 1 tablet (40 mg total) by mouth daily. 30 tablet 5  . carvedilol (COREG) 3.125 MG tablet Take 1 tablet (3.125 mg total) by mouth 2 (two) times daily. 60 tablet 5  . citalopram (CELEXA) 10 MG tablet Take 1 tablet (10 mg total) by mouth daily. 30 tablet 1  . clopidogrel (PLAVIX) 75 MG tablet Take 1 tablet (75 mg total) by mouth daily. 90 tablet 4  . digoxin (LANOXIN) 0.125 MG tablet Take 0.125 mg by mouth daily.    Marland Kitchen losartan (COZAAR) 25 MG tablet Take 0.5 tablets (12.5 mg total) by mouth daily. 30 tablet 5  . pantoprazole (PROTONIX) 40 MG tablet Take 1 tablet (40 mg total) by mouth daily. 30 tablet 5  . spironolactone (ALDACTONE) 25 MG tablet Take 12.5 mg by mouth daily.    . furosemide (LASIX) 20 MG tablet Take 1 tablet (20 mg total) by mouth daily as needed. Use as needed for lower leg swelling (Patient not taking: Reported on 08/07/2019) 30 tablet 4   No current facility-administered medications for this encounter.    Vitals:   08/07/19 1430  BP: 110/64  Pulse: 65  SpO2: 95%  Weight: 89.1 kg (196 lb 6.4 oz)    PHYSICAL EXAM: General:  Well appearing. No resp difficulty HEENT: normal Neck: supple. JVP flat. Carotids 2+ bilaterally; no bruits. No lymphadenopathy or  thryomegaly appreciated. Cor: PMI normal. Regular rate & rhythm. No rubs, gallops or murmurs. Lungs: clear Abdomen: soft, nontender, nondistended. No hepatosplenomegaly. No bruits or masses. Good bowel sounds. Extremities: no cyanosis, clubbing, rash, edema Neuro: alert & orientedx3, cranial nerves grossly intact. Left hand grip 4/5 weakness. Moves all 4 extremities w/o difficulty. Affect pleasant.  Skin: Eschar occiput x2 intact no drainage.    ECG: SR with q waves anterior leads 65 bpm    ASSESSMENT & PLAN:  1. CAD LHC showed 99% proximal LAD thrombotic occlusion, s/p PCI + DES placement. - No chest pain.  - Continue DAPT w/ ASA + plavix due  to addition of eliquis.  - Continue atorvastatin 40 mg daily. 2. Chronic Systolic Heart Failure  -Echo 2/70/62 EF 15%. RV systolic function mildly reduced  - f/u echo on 3/13 LVEF 20-25%, TEE 07/21/19 EF 30-35% - Plan to repeat ECHO in 3 months after medications optimized. If EF remains < 35% will need referral to EP.  - NYHA II-III. Volume status stable. Does not need lasix.  - SBP at home low 90s. No room to uptitrate meds.  - Continue carvedilol 3.125 mg twice a day  - Continue losartan 12.5 mg daily at bedtime. Will need to switch to entresto for mortality benefit once BP improves.  -Continue spiro 12.5 mg daily.  - Check BMET  3. H/O VF Arrest in the setting of MI.  -Repeat ECHO in 3 months after meds optimized.  4. Multiple CVAs as on MRI  5. Depression -Continue celexa  6. Pressure Ulcer,Unstageable  - Occiput. Suspect due to severe shock and instability. We discussed he may lose his hair   Follow up in 2 weeks.  Dr Gala Romney saw him as well.   Edward Becket NP-C  4:28 PM   Patient seen and examined with Edward Becket, NP. We discussed all aspects of the encounter. I agree with the assessment and plan as stated above.   49 y/o male with recent VF arrest with prolonged down time (40-45 mins) in setting of anterior STEMI. Course c/b  VDRF, ? Of mild anoxic brain injury with multiple embolic infarcts on brain MRI and atrial flutter. EF 30-35% prior to d/c.   Presents today with his wife. Doing remarkable well. NYHA II. Walking 2x/day. BP low but not symptomatic. No anginal symptoms. Mental status appears intact.   General:  Well appearing. No resp difficulty HEENT: normal x for eschar on back of head Neck: supple. no JVD. Carotids 2+ bilat; no bruits. No lymphadenopathy or thryomegaly appreciated. Cor: PMI nondisplaced. Regular rate & rhythm. No rubs, gallops or murmurs. Lungs: clear Abdomen: soft, nontender, nondistended. No hepatosplenomegaly. No bruits or masses. Good bowel sounds. Extremities: no cyanosis, clubbing, rash, edema Neuro: alert & orientedx3, cranial nerves grossly intact. moves all 4 extremities w/o difficulty. Affect pleasant  ECG shows NSR with persistent qs across the anterior precordium through v4.   Overall doing very well. BP soft but stable. Remains in NSR. No bleeding of Plavix and Eliquis. No room to titrate meds. Refer back to CR at [facility].  Will need ECHO in 3 months and possible ICD.  Edward Meres, MD  10:09 PM

## 2019-08-08 ENCOUNTER — Encounter: Payer: Self-pay | Admitting: General Practice

## 2019-08-08 ENCOUNTER — Ambulatory Visit: Payer: Managed Care, Other (non HMO) | Admitting: General Practice

## 2019-08-08 ENCOUNTER — Telehealth (HOSPITAL_COMMUNITY): Payer: Self-pay | Admitting: Cardiology

## 2019-08-08 VITALS — BP 100/62 | HR 74 | Temp 97.3°F | Ht 73.0 in | Wt 197.8 lb

## 2019-08-08 DIAGNOSIS — E785 Hyperlipidemia, unspecified: Secondary | ICD-10-CM

## 2019-08-08 DIAGNOSIS — I469 Cardiac arrest, cause unspecified: Secondary | ICD-10-CM | POA: Diagnosis not present

## 2019-08-08 DIAGNOSIS — I2102 ST elevation (STEMI) myocardial infarction involving left anterior descending coronary artery: Secondary | ICD-10-CM | POA: Diagnosis not present

## 2019-08-08 DIAGNOSIS — I5022 Chronic systolic (congestive) heart failure: Secondary | ICD-10-CM

## 2019-08-08 DIAGNOSIS — I4901 Ventricular fibrillation: Secondary | ICD-10-CM

## 2019-08-08 NOTE — Patient Instructions (Signed)
Medication Instructions:  The current medical regimen is effective;  continue present plan and medications as directed. Please refer to the Current Medication list given to you today. *If you need a refill on your cardiac medications before your next appointment, please call your pharmacy*  Lab Work: FASTING LIPID AND LFT IN 2 MONTHS (June) If you have labs (blood work) drawn today and your tests are completely normal, you will receive your results only by:  MyChart Message (if you have MyChart) OR A paper copy in the mail.  If you have any lab test that is abnormal or we need to change your treatment, we will call you to review the results.  Special Instructions MAY START CARDIAC REHAB  PLEASE READ AND FOLLOW SALTY 6-ATTACHED  Follow-Up: Your next appointment:  3 month(s) Please call our office 2 months in advance to schedule this appointment In Person with Bryan Lemma, MD  At [facility], you and your health needs are our priority.  As part of our continuing mission to provide you with exceptional heart care, we have created designated Provider Care Teams.  These Care Teams include your primary Cardiologist (physician) and Advanced Practice Providers (APPs -  Physician Assistants and Nurse Practitioners) who all work together to provide you with the care you need, when you need it.

## 2019-08-08 NOTE — Telephone Encounter (Signed)
Order, OV note, stop bang and demographics all faxed to [facility] at [phone]  

## 2019-08-10 ENCOUNTER — Telehealth: Payer: Self-pay

## 2019-08-10 NOTE — Telephone Encounter (Signed)
Left detailed message, OK for pt to start cardiac rehab. Will call again Monday

## 2019-08-10 NOTE — Telephone Encounter (Signed)
-----   Message from Chelsea Aus, RN sent at 08/08/2019 11:34 AM EDT ----- Regarding: RE: CARDIAC REHAB Hey Marcelino Duster! Pt lives in [place] and was referred to [facility] for cardiac rehab.  Their contact information is in case you would like to follow up with them directly: Phone: [phone] Fax: [phone] Let me know if I can be of any further assistance. Karlene Lineman RN, BSN Cardiac and Pulmonary Rehab Nurse Navigator   ----- Message ----- From: Alyson Ingles, LPN Sent: 05/05/2447  11:06 AM EDT To: Chelsea Aus, RN Subject: CARDIAC REHAB                                  OK TO START CARDIAC REHAB  THANK YOU

## 2019-08-13 NOTE — Telephone Encounter (Signed)
Cardiac Rehab called and stated that patient has been enrolled since 07/27/19.

## 2019-08-13 NOTE — Telephone Encounter (Signed)
Called [facility] to start phone numbers for pt given. Nothing else needed

## 2019-08-14 ENCOUNTER — Telehealth: Payer: Self-pay | Admitting: *Deleted

## 2019-08-14 NOTE — Telephone Encounter (Signed)
Participant ID: #[id number] Reference Code: [id number] Authorization Number:[id number] APPROVED EFFECTIVE DATES:08/14/19 THROUGH 11/12/2019

## 2019-08-17 ENCOUNTER — Telehealth: Payer: Self-pay | Admitting: Cardiology

## 2019-08-17 NOTE — Telephone Encounter (Signed)
Dawn from [facility] called to verify some of the patient's information as well as give her name and contact number to be added to his chart in the case of her assistance being needed. Please advise.

## 2019-08-17 NOTE — Telephone Encounter (Signed)
LM for Dawn, nurse case manager with [facility] to return call

## 2019-08-21 ENCOUNTER — Ambulatory Visit (HOSPITAL_COMMUNITY)
Admission: RE | Admit: 2019-08-21 | Discharge: 2019-08-21 | Disposition: A | Discharge status: Home / Self Care | Payer: Managed Care, Other (non HMO) | Source: Ambulatory Visit | Attending: Adult Health | Admitting: Adult Health

## 2019-08-21 ENCOUNTER — Encounter (HOSPITAL_COMMUNITY): Payer: Self-pay

## 2019-08-21 ENCOUNTER — Other Ambulatory Visit: Payer: Self-pay

## 2019-08-21 VITALS — BP 106/72 | HR 70 | Wt 198.6 lb

## 2019-08-21 DIAGNOSIS — Z87891 Personal history of nicotine dependence: Secondary | ICD-10-CM | POA: Insufficient documentation

## 2019-08-21 DIAGNOSIS — Z8673 Personal history of transient ischemic attack (TIA), and cerebral infarction without residual deficits: Secondary | ICD-10-CM | POA: Diagnosis not present

## 2019-08-21 DIAGNOSIS — Z955 Presence of coronary angioplasty implant and graft: Secondary | ICD-10-CM | POA: Insufficient documentation

## 2019-08-21 DIAGNOSIS — I4892 Unspecified atrial flutter: Secondary | ICD-10-CM | POA: Insufficient documentation

## 2019-08-21 DIAGNOSIS — Z79899 Other long term (current) drug therapy: Secondary | ICD-10-CM | POA: Diagnosis not present

## 2019-08-21 DIAGNOSIS — L89810 Pressure ulcer of head, unstageable: Secondary | ICD-10-CM

## 2019-08-21 DIAGNOSIS — R002 Palpitations: Secondary | ICD-10-CM

## 2019-08-21 DIAGNOSIS — Z7902 Long term (current) use of antithrombotics/antiplatelets: Secondary | ICD-10-CM | POA: Insufficient documentation

## 2019-08-21 DIAGNOSIS — F329 Major depressive disorder, single episode, unspecified: Secondary | ICD-10-CM | POA: Diagnosis not present

## 2019-08-21 DIAGNOSIS — I2510 Atherosclerotic heart disease of native coronary artery without angina pectoris: Secondary | ICD-10-CM | POA: Diagnosis not present

## 2019-08-21 DIAGNOSIS — I252 Old myocardial infarction: Secondary | ICD-10-CM | POA: Diagnosis not present

## 2019-08-21 DIAGNOSIS — I5022 Chronic systolic (congestive) heart failure: Secondary | ICD-10-CM | POA: Insufficient documentation

## 2019-08-21 DIAGNOSIS — Z8674 Personal history of sudden cardiac arrest: Secondary | ICD-10-CM | POA: Insufficient documentation

## 2019-08-21 DIAGNOSIS — Z7901 Long term (current) use of anticoagulants: Secondary | ICD-10-CM | POA: Insufficient documentation

## 2019-08-21 DIAGNOSIS — Z7982 Long term (current) use of aspirin: Secondary | ICD-10-CM | POA: Insufficient documentation

## 2019-08-21 MED ORDER — SPIRONOLACTONE 25 MG PO TABS: 25.0000 mg | ORAL_TABLET | Freq: Every day | ORAL | 6 refills | Status: DC

## 2019-08-21 NOTE — Telephone Encounter (Signed)
Left message for dawn to call

## 2019-08-21 NOTE — Progress Notes (Signed)
PCP: Dr Welton Flakes Cardiology: Dr [name] Primary HF Cardiologist: Dr Gala Romney   HPI: Edward Mendoza is a 49 year old with history of CAD, STEMI, VF arrest 07/11/19,and CVAs.   On 07/11/19 presented with acute STEMI complicated by witnessed VF arrest who was brought to the ED 07/11/19. The patient had called EMS for stuttering chest pain for the last several days. He subsequently had a witnessed VF arrest with immediate resuscitation. He received a total of 6 external defibrillation and 2mg  epi and had emergency airway placed. He had a total of 40-45 minutes of ACLS before ROSC which was shortly after arrival to the ED. In the ED EKG demonstrated STEMI and he was transferred emergently to [facility] hospital for cardiac cath. LHC showed 99% proximal LAD occlusion treated with PCI/DES to LAD. No other significant disease. He was started on DAPT with Aspirin and Brilinta. No statin started secondary to shock liver but later started as he improved. BB held in the setting of low output heart failure. Brilinta was stopped and plavix started with the addition of Eliquis (for aflutter). Patient worked with cardiac rehab and was able to ambulate up and down the halls with rollator. Did have intermittent pleuritic symptoms as well as chest wall soreness from CPR. TEE DC-CV 3/20 with conversion to NSR EF 30-35%. Discharged to home on 3/21/ 21.   Today he returns for HF follow up.Overall feeling fine. Feeling much stronger since the last visit. He has been walking 30 minutes a day and attending cardiac rehab 3 days a week. Having occasional palpitations at rest that last for a few seconds.  Denies SOB/PND/Orthopnea. No chest pain. Appetite ok. No fever or chills. Weight at home 198 pounds. Taking all medications. He has been managing his own medications.    ECHO   07/14/19 EF 20-25%   LHC 07/11/19    Anterior STEMI complicated by VF arrest.    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   Otherwise minimal disease elsewhere.  Successful DES PCI of proximal LAD using resolute Onyx DES 3.5 x 18 mm postdilated to 4.0 mm.  Systolic hypotension with borderline cardiogenic shock with systolic pressures in the 90s over 60s requiring Levophed at 10 mcg/min. -->  Cardiac output-index 4.77-2.04; cardiac power 0.8.  No evidence of pulmonary hypertension  ROS: All systems negative except as listed in HPI, PMH and Problem List.  SH:  Social History   Socioeconomic History  . Marital status: Married    Spouse name: Not on file  . Number of children: Not on file  . Years of education: Not on file  . Highest education level: Not on file  Occupational History  . 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 Smoking status: Former Smoker    Packs/day: 1.00    Years: 20.00    Pack years: 20.00    Types: Cigarettes    Start date: 2000    Quit date: 07/11/2019    Years since quitting: 0.1  . Smokeless tobacco: Never Used  Substance and Sexual Activity  . Alcohol use: Not Currently  . Drug use: Not Currently  . Sexual activity: Yes  Other Topics Concern  . Not on file  Social History Narrative  . Not on file   Social Determinants of Health   Financial Resource Strain:   . Difficulty of Paying Living Expenses:   Food Insecurity:   . Worried About 09/10/2019 in  the Last Year:   . Ran Out of Food in the Last Year:   Transportation Needs: No Transportation Needs  . Lack of Transportation (Medical): No  . Lack of Transportation (Non-Medical): No  Physical Activity: Sufficiently Active  . Days of Exercise per Week: 7 days  . Minutes of Exercise per Session: 30 min  Stress:   . Feeling of Stress :   Social Connections:   . Frequency of Communication with Friends and Family:   . Frequency of Social Gatherings with Friends and Family:   . Attends Religious Services:   . Active Member of Clubs or Organizations:   . Attends Tax inspector Meetings:   Marland Kitchen Marital Status:   Intimate Partner Violence:   . Fear of Current or Ex-Partner:   . Emotionally Abused:   Marland Kitchen Physically Abused:   . Sexually Abused:     FH: No family history on file.  Past Medical History:  Diagnosis Date  . Smoker 07/13/2019    Current Outpatient Medications  Medication Sig Dispense Refill  . amiodarone (PACERONE) 200 MG tablet Take 1 tablet (200 mg total) by mouth daily. 30 tablet 5  . apixaban (ELIQUIS) 5 MG TABS tablet Take 1 tablet (5 mg total) by mouth 2 (two) times daily. 180 tablet 4  . aspirin 81 MG chewable tablet Chew 1 tablet (81 mg total) by mouth daily. Stop 4/11 30 tablet 3  . atorvastatin (LIPITOR) 40 MG tablet Take 1 tablet (40 mg total) by mouth daily. 30 tablet 5  . carvedilol (COREG) 3.125 MG tablet Take 1 tablet (3.125 mg total) by mouth 2 (two) times daily. 60 tablet 5  . citalopram (CELEXA) 10 MG tablet Take 1 tablet (10 mg total) by mouth daily. 30 tablet 1  . clopidogrel (PLAVIX) 75 MG tablet Take 1 tablet (75 mg total) by mouth daily. 90 tablet 4  . digoxin (LANOXIN) 0.125 MG tablet Take 0.125 mg by mouth daily.    Marland Kitchen losartan (COZAAR) 25 MG tablet Take 0.5 tablets (12.5 mg total) by mouth daily. 30 tablet 5  . pantoprazole (PROTONIX) 40 MG tablet Take 1 tablet (40 mg total) by mouth daily. 30 tablet 5  . spironolactone (ALDACTONE) 25 MG tablet Take 12.5 mg by mouth daily.    . furosemide (LASIX) 20 MG tablet Take 1 tablet (20 mg total) by mouth daily as needed. Use as needed for lower leg swelling (Patient not taking: Reported on 08/21/2019) 30 tablet 4   No current facility-administered medications for this encounter.    Vitals:   08/21/19 1339  BP: 106/72  Pulse: 70  SpO2: 100%  Weight: 90.1 kg (198 lb 9.6 oz)   Wt Readings from Last 3 Encounters:  08/21/19 90.1 kg (198 lb 9.6 oz)  08/08/19 89.7 kg (197 lb 12.8 oz)  08/07/19 89.1 kg (196 lb 6.4 oz)    PHYSICAL EXAM: General:  Well appearing. No resp  difficulty HEENT: normal Neck: supple. no JVD. Carotids 2+ bilat; no bruits. No lymphadenopathy or thryomegaly appreciated. Cor: PMI nondisplaced. Regular rate & rhythm. No rubs, gallops or murmurs. Lungs: clear Abdomen: soft, nontender, nondistended. No hepatosplenomegaly. No bruits or masses. Good bowel sounds. Extremities: no cyanosis, clubbing, rash, edema Neuro: alert & orientedx3, cranial nerves grossly intact. moves all 4 extremities w/o difficulty. Affect pleasant SKin: Black eschar on occiput x2 intact      ASSESSMENT & PLAN:  1. CAD LHC showed 99% proximal LAD thrombotic occlusion, s/p PCI + DES placement. -  No chest pain.  - Continue DAPT w/ ASA + plavix due to addition of eliquis.  - Continue atorvastatin 40 mg daily. 2. Chronic Systolic Heart Failure  -Echo 07/11/19 EF 15%. RV systolic function mildly reduced  - f/u echo on 3/13 LVEF 20-25%, TEE 07/21/19 EF 30-35% - Plan to repeat ECHO in 3 months after medications optimized. If EF remains < 35% will need referral to EP. Set up repeat ECHO in June.  - NYHA II.  -  Volume status stable. Does not need lasix.  - Continue carvedilol 3.125 mg twice a day  - Continue losartan 12.5 mg daily at bedtime. Will need to switch to entresto for mortality benefit once BP improves.  -Increase spiro to 25 mg daily.  - Check BMET in 7 days at his PCP. Provided with script to repeat BMET.  3. H/O VF Arrest in the setting of MI.  -Repeat ECHO in 3 months after meds optimized.  4. Multiple CVAs as on MRI  5. Depression -Continue celexa  6. Pressure Ulcer,Unstageable  - Occiput. Suspect due to severe shock and instability.  -Eschar is starting to lift off.  - We discussed he may lose his hair.  - No change. Stable.  7. Palpitations Place 7 day Zio Patch.   Needs to stay out of work for now.  Follow up in 4 weeks with APP and  8 weeks with Dr Haroldine Laws and an ECHO.   Cathyann Kilfoyle NP-C  1:50 PM

## 2019-08-21 NOTE — Patient Instructions (Signed)
INCREASE Spironolactone 25 mg, one tab daily  Labs needed in 7-10 days Ok to have labs done at your PCP office  Your provider has recommended that  you wear a Zio Patch for 7 days.  This monitor will record your heart rhythm for our review.  IF you have any symptoms while wearing the monitor please press the button.  If you have any issues with the patch or you notice a red or orange light on it please call the company at [phone].  Once you remove the patch please mail it back to the company as soon as possible so we can get the results.  Your physician recommends that you schedule a follow-up appointment in: 4 weeks  in the Advanced Practitioners (PA/NP) Clinic   and keep your follow up with Dr Gala Romney in 8 weeks  Do the following things EVERYDAY: 1) Weigh yourself in the morning before breakfast. Write it down and keep it in a log. 2) Take your medicines as prescribed 3) Eat low salt foods--Limit salt (sodium) to 2000 mg per day.  4) Stay as active as you can everyday 5) Limit all fluids for the day to less than 2 liters  At the [facility], you and your health needs are our priority. As part of our continuing mission to provide you with exceptional heart care, we have created designated Provider Care Teams. These Care Teams include your primary Cardiologist (physician) and Advanced Practice Providers (APPs- Physician Assistants and Nurse Practitioners) who all work together to provide you with the care you need, when you need it.   You may see any of the following providers on your designated Care Team at your next follow up: Marland Kitchen Dr Arvilla Meres . Dr Marca Ancona . Tonye Becket, NP . Robbie Lis, PA . Karle Plumber, PharmD   Please be sure to bring in all your medications bottles to every appointment.

## 2019-08-22 ENCOUNTER — Encounter (INDEPENDENT_AMBULATORY_CARE_PROVIDER_SITE_OTHER): Payer: Managed Care, Other (non HMO) | Admitting: Cardiology

## 2019-08-22 DIAGNOSIS — I5021 Acute systolic (congestive) heart failure: Secondary | ICD-10-CM | POA: Diagnosis not present

## 2019-09-05 ENCOUNTER — Telehealth: Payer: Self-pay | Admitting: Cardiology

## 2019-09-05 MED ORDER — NITROGLYCERIN 0.4 MG SL SUBL: 0.4000 mg | SUBLINGUAL_TABLET | SUBLINGUAL | 6 refills | Status: DC | PRN

## 2019-09-05 NOTE — Telephone Encounter (Signed)
Will forward to Dr Harding for review  

## 2019-09-05 NOTE — Telephone Encounter (Signed)
Thank you.  Ronie Fleeger, MD  

## 2019-09-05 NOTE — Telephone Encounter (Signed)
Pt was at Cardiac Rehab and the Rehab techs mentioned Nitroglycerin at a visit. The patient does not have any on hand to take at this time, but was hoping Dr. [name] could write him an rx so that he can have it on hand.  Please send the rx to [id number] - [place], [place] - [address] AT [facility] [address]

## 2019-09-05 NOTE — Telephone Encounter (Signed)
Informed Patient. medication was e sent to pharmacy  With refills

## 2019-09-06 ENCOUNTER — Ambulatory Visit: Payer: Managed Care, Other (non HMO)

## 2019-09-06 DIAGNOSIS — R0683 Snoring: Secondary | ICD-10-CM

## 2019-09-06 NOTE — Procedures (Signed)
   Sleep Study Report  Patient Information Name: Edward Mendoza  ID: [id number] Birth Date: 02-23-1971  Age: 49  Gender: Male Study Date:08/22/2019 Referring Physician:  Jeanella Cara, MD  TEST DESCRIPTION: Home sleep apnea testing was completed using the WatchPat, a Type 1 device, utilizing peripheral arterial tonometry (PAT), chest movement, actigraphy, pulse oximetry, pulse rate, body position and snore. AHI was calculated with apnea and hypopnea using valid sleep time as the denominator. RDI includes apneas, hypopneas, and RERAs. The data acquired and the scoring of sleep and all associated events were performed in accordance with the recommended standards and specifications as outlined in the AASM Manual for the Scoring of Sleep and Associated Events 2.2.0 (2015).   FINDINGS: 1. No evidence of Obstructive Sleep Apnea with AHI 0.7/hr.  2. No Central Sleep Apnea. 3. Oxygen desaturations as low as 92%. 4. Minimal snoring was present. O2 sats were < 88% for . 5. Total sleep time was 7 hrs and 46 min. 6. 22% of total sleep time was spent in REM sleep.  7. Normal sleep onset latency at 24 min.  8. Short REM sleep onset latency at 48 min.  9. Total awakenings were 7.   DIAGNOSIS:  Normal study with no significant sleep disordered breathing.  RECOMMENDATIONS: 1. Normal study with no significant sleep disordered breathing.  2. Healthy sleep recommendations include: adequate nightly sleep (normal 7-9 hrs/night), avoidance of caffeine after noon and alcohol near bedtime, and maintaining a sleep environment that is cool, dark and quiet.  3. Weight loss for overweight patients is recommended.   4. Snoring recommendations include: weight loss where appropriate, side sleeping, and avoidance of alcohol before bed.  5. Operation of motor vehicle or dangerous equipment must be avoided when feeling drowsy, excessively sleepy, or mentally fatigued.   6. An ENT consultation which may  be useful for specific causes of and possible treatment of bothersome snoring .   7. Weight loss may be of benefit in reducing the severity of snoring.   Report prepared by: Signature:  Armanda Magic, MD [facility], Diplomate ABSM Electronically Signed: Sep 06, 2019

## 2019-09-07 ENCOUNTER — Other Ambulatory Visit (HOSPITAL_COMMUNITY): Payer: Self-pay | Admitting: Internal Medicine

## 2019-09-13 ENCOUNTER — Telehealth: Payer: Self-pay | Admitting: *Deleted

## 2019-09-13 NOTE — Telephone Encounter (Signed)
-----   Message from Quintella Reichert, MD sent at 09/06/2019 11:18 AM EDT ----- Please let patient know that sleep study showed no significant sleep apnea.

## 2019-09-13 NOTE — Telephone Encounter (Signed)
Informed patient of sleep study results and patient understanding was verbalized. Patient understands his sleep study showed no significant sleep apnea.   Pt is aware and agreeable to normal results. 

## 2019-09-14 ENCOUNTER — Telehealth (HOSPITAL_COMMUNITY): Payer: Self-pay | Admitting: *Deleted

## 2019-09-14 ENCOUNTER — Telehealth: Payer: Self-pay | Admitting: Cardiology

## 2019-09-14 NOTE — Telephone Encounter (Signed)
New Message  Patient c/o Palpitations:  High priority if patient c/o lightheadedness, shortness of breath, or chest pain  1) How long have you had palpitations/irregular HR/ Afib? Are you having the symptoms now? No  2) Are you currently experiencing lightheadedness, SOB or CP? No  3) Do you have a history of afib (atrial fibrillation) or irregular heart rhythm? New since heart attack  4) Have you checked your BP or HR? (document readings if available): 128/72 HR 100  5) Are you experiencing any other symptoms? SVT  At Rehab [phone]) ,  Palpatations, Hot sensation over upper body,

## 2019-09-14 NOTE — Telephone Encounter (Signed)
Received information and strips from Dr Elissa Hefty office, pt had 9 beats of VT at cardiac rehab this morning.   A Zio monitor was placed 4/20 for palps, no VT on that report.  Dr Gala Romney reviewed information and feels pt needs a Zoll LifeVest, he called and discussed w/pt. Pt is aware and agreeable to lifevest.  Order completed and signed by Dr Gala Romney and faxed into [employer] at [phone]

## 2019-09-14 NOTE — Telephone Encounter (Signed)
Patient also called office  See below      Patient c/o Palpitations:  High priority if patient c/o lightheadedness, shortness of breath, or chest pain  1. How long have you had palpitations/irregular HR/ Afib? Are you having the symptoms now? No  2. Are you currently experiencing lightheadedness, SOB or CP? No  3. Do you have a history of afib (atrial fibrillation) or irregular heart rhythm? New since heart attack  4. Have you checked your BP or HR? (document readings if available): 128/72 HR 100  5. Are you experiencing any other symptoms? SVT  At Rehab [facility]-[phone]) ,  Palpatations, Hot sensation over upper body,      Information routed to heart failure clinic for Dr Gala Romney to review  as well as EKG strips. Heart failure Clinic will contact patient

## 2019-09-14 NOTE — Telephone Encounter (Signed)
See telephone note  09/14/19- cardiac rehab

## 2019-09-14 NOTE — Telephone Encounter (Signed)
Deanna from [facility] calling stating the patient had arhythmia today during his rehab. She states the cardiologist at their office requested she speak with Dr. Elissa Hefty nurse.

## 2019-09-14 NOTE — Telephone Encounter (Signed)
Spoke to  Cardiac rehab -  Patient was exercising today  At rehab   Patient had  9 beat run VT. Strip was shown to MD at rehab and verified.   Per Deanna, patient did feel episode.he informed Deanna that he has been feeling this episode throughout  the week.  No symptoms now and he has left rehab.  No changes with blood pressure    strips will faxed to office. Reviewed.   patient wore a monitor for heart failure clinic and has an appointment 09/20/19

## 2019-09-16 ENCOUNTER — Other Ambulatory Visit: Payer: Self-pay | Admitting: Medical

## 2019-09-19 ENCOUNTER — Other Ambulatory Visit: Payer: Self-pay

## 2019-09-19 ENCOUNTER — Other Ambulatory Visit: Payer: Self-pay | Admitting: Cardiology

## 2019-09-19 MED ORDER — CITALOPRAM HYDROBROMIDE 10 MG PO TABS: 10.0000 mg | ORAL_TABLET | Freq: Every day | ORAL | 1 refills | Status: DC

## 2019-09-19 NOTE — Telephone Encounter (Signed)
*  STAT* If patient is at the pharmacy, call can be transferred to refill team.   1. Which medications need to be refilled? (please list name of each medication and dose if known)citalopram (CELEXA) 10 MG tablet  2. Which pharmacy/location (including street and city if local pharmacy) is medication to be sent to? [id number] - [place], [place] - [address] AT [address]  3. Do they need a 30 day or 90 day supply? 90 day   Only has 1 pill left.

## 2019-09-19 NOTE — Progress Notes (Signed)
PCP: Dr Humphrey Rolls Cardiology: Dr Ellyn Hack Primary HF Cardiologist: Dr Haroldine Laws   HPI: Edward Mendoza is a 49 year old with history of CAD, STEMI, VF arrest 07/11/19,and CVAs.   On 07/11/19 presented with acute STEMI complicated by witnessed VF arrest who was brought to the ED 07/11/19. The patient had called EMS for stuttering chest pain for the last several days. He subsequently had a witnessed VF arrest with immediate resuscitation. He received a total of 6 external defibrillation and 2mg  epi and had emergency airway placed. He had a total of 40-45 minutes of ACLS before ROSC which was shortly after arrival to the ED. In the ED EKG demonstrated STEMI and he was transferred emergently to [facility] hospital for cardiac cath. LHC showed 99% proximal LAD occlusion treated with PCI/DES to LAD. No other significant disease. He was started on DAPT with Aspirin and Brilinta. No statin started secondary to shock liver but later started as he improved. BB held in the setting of low output heart failure. Brilinta was stopped and plavix started with the addition of Eliquis (for aflutter). Patient worked with cardiac rehab and was able to ambulate up and down the halls with rollator. Did have intermittent pleuritic symptoms as well as chest wall soreness from CPR. TEE DC-CV 3/20 with conversion to NSR EF 30-35%. Discharged to home on 3/21/ 21.   Today he returns for HF follow up. Last visit spiro was increased to 25 mg daily. Overall feeling fine. Denies SOB/PND/Orthopnea. No chest pain. Appetite ok. No fever or chills. Weight at home has been stable. Walking 9800 feet. Taking all medications. He has been going to cardiac rehab 3 days a week.  Wearing Zoll with no issues.    ECHO   07/14/19 EF 20-25%   LHC 07/11/19    Anterior STEMI complicated by VF arrest.    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  Otherwise minimal disease elsewhere.  Successful DES PCI of proximal LAD using  resolute Onyx DES 3.5 x 18 mm postdilated to 4.0 mm.  Systolic hypotension with borderline cardiogenic shock with systolic pressures in the 90s over 60s requiring Levophed at 10 mcg/min. -->  Cardiac output-index 4.77-2.04; cardiac power 0.8.  No evidence of pulmonary hypertension  ROS: All systems negative except as listed in HPI, PMH and Problem List.  SH:  Social History   Socioeconomic History  . Marital status: Married    Spouse name: Not on file  . Number of children: Not on file  . Years of education: Not on file  . Highest education level: Not on file  Occupational History  . Occupation: Theatre stage manager: [employer]  Tobacco Use  . Smoking status: Former Smoker    Packs/day: 1.00    Years: 20.00    Pack years: 20.00    Types: Cigarettes    Start date: 2000    Quit date: 07/11/2019    Years since quitting: 0.1  . Smokeless tobacco: Never Used  Substance and Sexual Activity  . Alcohol use: Not Currently  . Drug use: Not Currently  . Sexual activity: Yes  Other Topics Concern  . Not on file  Social History Narrative  . Not on file   Social Determinants of Health   Financial Resource Strain:   . Difficulty of Paying Living Expenses:   Food Insecurity:   . Worried About Charity fundraiser in the Last Year:   . Arboriculturist in  the Last Year:   Transportation Needs: No Transportation Needs  . Lack of Transportation (Medical): No  . Lack of Transportation (Non-Medical): No  Physical Activity: Sufficiently Active  . Days of Exercise per Week: 7 days  . Minutes of Exercise per Session: 30 min  Stress:   . Feeling of Stress :   Social Connections:   . Frequency of Communication with Friends and Family:   . Frequency of Social Gatherings with Friends and Family:   . Attends Religious Services:   . Active Member of Clubs or Organizations:   . Attends Banker Meetings:   Marland Kitchen Marital Status:   Intimate Partner Violence:   . Fear of  Current or Ex-Partner:   . Emotionally Abused:   Marland Kitchen Physically Abused:   . Sexually Abused:     FH: No family history on file.  Past Medical History:  Diagnosis Date  . Smoker 07/13/2019    Current Outpatient Medications  Medication Sig Dispense Refill  . amiodarone (PACERONE) 200 MG tablet Take 1 tablet (200 mg total) by mouth daily. 30 tablet 5  . apixaban (ELIQUIS) 5 MG TABS tablet Take 1 tablet (5 mg total) by mouth 2 (two) times daily. 180 tablet 4  . aspirin 81 MG chewable tablet Chew 1 tablet (81 mg total) by mouth daily. Stop 4/11 30 tablet 3  . atorvastatin (LIPITOR) 40 MG tablet Take 1 tablet (40 mg total) by mouth daily. 30 tablet 5  . carvedilol (COREG) 3.125 MG tablet Take 1 tablet (3.125 mg total) by mouth 2 (two) times daily. 60 tablet 5  . citalopram (CELEXA) 10 MG tablet Take 1 tablet (10 mg total) by mouth daily. 90 tablet 1  . clopidogrel (PLAVIX) 75 MG tablet Take 1 tablet (75 mg total) by mouth daily. 90 tablet 4  . digoxin (LANOXIN) 0.125 MG tablet Take 0.125 mg by mouth daily.    Marland Kitchen losartan (COZAAR) 25 MG tablet Take 0.5 tablets (12.5 mg total) by mouth daily. 30 tablet 5  . pantoprazole (PROTONIX) 40 MG tablet Take 1 tablet (40 mg total) by mouth daily. 30 tablet 5  . spironolactone (ALDACTONE) 25 MG tablet Take 1 tablet (25 mg total) by mouth daily. 30 tablet 6  . furosemide (LASIX) 20 MG tablet Take 1 tablet (20 mg total) by mouth daily as needed. Use as needed for lower leg swelling (Patient not taking: Reported on 09/20/2019) 30 tablet 4  . nitroGLYCERIN (NITROSTAT) 0.4 MG SL tablet Place 1 tablet (0.4 mg total) under the tongue every 5 (five) minutes as needed for chest pain. (Patient not taking: Reported on 09/20/2019) 25 tablet 6   No current facility-administered medications for this encounter.    Vitals:   09/20/19 1142  BP: 118/72  Pulse: 61  SpO2: 100%  Weight: 91.7 kg (202 lb 3.2 oz)   Wt Readings from Last 3 Encounters:  09/20/19 91.7 kg (202  lb 3.2 oz)  08/21/19 90.1 kg (198 lb 9.6 oz)  08/08/19 89.7 kg (197 lb 12.8 oz)    PHYSICAL EXAM: General:  Well appearing. No resp difficulty. Wearing ZOLL HEENT: normal Neck: supple. no JVD. Carotids 2+ bilat; no bruits. No lymphadenopathy or thryomegaly appreciated. Cor: PMI nondisplaced. Regular rate & rhythm. No rubs, gallops or murmurs. Lungs: clear Abdomen: soft, nontender, nondistended. No hepatosplenomegaly. No bruits or masses. Good bowel sounds. Extremities: no cyanosis, clubbing, rash, edema Neuro: alert & orientedx3, cranial nerves grossly intact. moves all 4 extremities w/o difficulty. Affect pleasant SKin:  Black eschar on occiput x2 intact       ASSESSMENT & PLAN:  1. CAD LHC showed 99% proximal LAD thrombotic occlusion, s/p PCI + DES placement. - No chest pain.  - Continue DAPT w/ ASA + plavix due to addition of eliquis.  - Continue atorvastatin 40 mg daily. 2. Chronic Systolic Heart Failure  -Echo 07/16/38 EF 15%. RV systolic function mildly reduced  - f/u echo on 3/13 LVEF 20-25%, TEE 07/21/19 EF 30-35% - Plan to repeat ECHO in 3 months after medications optimized. If EF remains < 35% will need referral to EP. Set up repeat ECHO in June.  - NYHA I. Volume status stable.  -  Volume status stable. Does not need lasix.  - Continue carvedilol 3.125 mg twice a day  - Stop losartan. Start entresto 24-26 mg twice a day. Check BMET in 7 days.   -Continue spiro to 25 mg daily.  3. H/O VF Arrest in the setting of MI.  -Repeat ECHO in 3 months after meds optimized. Next month.  4. Multiple CVAs as on MRI  5. Depression -Continue celexa  6. Pressure Ulcer,Unstageable  - Occiput. Suspect due to severe shock and instability.  - Much improved. Resolved.   - We discussed he may lose his hair.  - No change. Stable.  7. Palpitations Place 7 day Zio Patch. 08/21/19  min HR of 52 bpm, max HR of 109 bpm, and avg HR of 66 bpm. Predominant underlying rhythm was Sinus Rhythm.  First Degree AV Block was present. Bundle Branch Block/IVCD was prese  Follow up  4 weeks with Dr Gala Romney and an ECHO. Discussed medications changes.   Lynn Sissel NP-C  11:53 AM

## 2019-09-20 ENCOUNTER — Other Ambulatory Visit: Payer: Self-pay

## 2019-09-20 ENCOUNTER — Ambulatory Visit (HOSPITAL_COMMUNITY)
Admission: RE | Admit: 2019-09-20 | Discharge: 2019-09-20 | Disposition: A | Discharge status: Home / Self Care | Payer: Managed Care, Other (non HMO) | Source: Ambulatory Visit | Attending: Internal Medicine | Admitting: Internal Medicine

## 2019-09-20 ENCOUNTER — Encounter (HOSPITAL_COMMUNITY): Payer: Self-pay

## 2019-09-20 VITALS — BP 118/72 | HR 61 | Wt 202.2 lb

## 2019-09-20 DIAGNOSIS — I252 Old myocardial infarction: Secondary | ICD-10-CM | POA: Insufficient documentation

## 2019-09-20 DIAGNOSIS — Z79899 Other long term (current) drug therapy: Secondary | ICD-10-CM | POA: Diagnosis not present

## 2019-09-20 DIAGNOSIS — Z7901 Long term (current) use of anticoagulants: Secondary | ICD-10-CM | POA: Diagnosis not present

## 2019-09-20 DIAGNOSIS — Z87891 Personal history of nicotine dependence: Secondary | ICD-10-CM | POA: Insufficient documentation

## 2019-09-20 DIAGNOSIS — R002 Palpitations: Secondary | ICD-10-CM | POA: Insufficient documentation

## 2019-09-20 DIAGNOSIS — Z8674 Personal history of sudden cardiac arrest: Secondary | ICD-10-CM | POA: Insufficient documentation

## 2019-09-20 DIAGNOSIS — Z955 Presence of coronary angioplasty implant and graft: Secondary | ICD-10-CM | POA: Insufficient documentation

## 2019-09-20 DIAGNOSIS — I5022 Chronic systolic (congestive) heart failure: Secondary | ICD-10-CM | POA: Insufficient documentation

## 2019-09-20 DIAGNOSIS — I2510 Atherosclerotic heart disease of native coronary artery without angina pectoris: Secondary | ICD-10-CM | POA: Insufficient documentation

## 2019-09-20 DIAGNOSIS — Z7982 Long term (current) use of aspirin: Secondary | ICD-10-CM | POA: Diagnosis not present

## 2019-09-20 DIAGNOSIS — Z7902 Long term (current) use of antithrombotics/antiplatelets: Secondary | ICD-10-CM | POA: Diagnosis not present

## 2019-09-20 DIAGNOSIS — I440 Atrioventricular block, first degree: Secondary | ICD-10-CM | POA: Diagnosis not present

## 2019-09-20 DIAGNOSIS — F329 Major depressive disorder, single episode, unspecified: Secondary | ICD-10-CM | POA: Diagnosis not present

## 2019-09-20 DIAGNOSIS — I959 Hypotension, unspecified: Secondary | ICD-10-CM | POA: Insufficient documentation

## 2019-09-20 DIAGNOSIS — I4901 Ventricular fibrillation: Secondary | ICD-10-CM

## 2019-09-20 DIAGNOSIS — L89810 Pressure ulcer of head, unstageable: Secondary | ICD-10-CM | POA: Diagnosis not present

## 2019-09-20 DIAGNOSIS — Z8673 Personal history of transient ischemic attack (TIA), and cerebral infarction without residual deficits: Secondary | ICD-10-CM | POA: Diagnosis not present

## 2019-09-20 MED ORDER — SACUBITRIL-VALSARTAN 24-26 MG PO TABS: 1.0000 | ORAL_TABLET | Freq: Two times a day (BID) | ORAL | 5 refills | Status: DC

## 2019-09-20 NOTE — Patient Instructions (Signed)
STOP Losartan  START Entresto 24/26mg  (1 tab) twice a day.  Can take first dose tonight  Labs in 1 week We will only contact you if something comes back abnormal or we need to make some changes. Otherwise no news is good news!  Your physician has requested that you have an echocardiogram. Echocardiography is a painless test that uses sound waves to create images of your heart. It provides your doctor with information about the size and shape of your heart and how well your heart's chambers and valves are working. This procedure takes approximately one hour. There are no restrictions for this procedure.  Your physician recommends that you schedule a follow-up appointment in: 1 month with Dr Gala Romney and an ECHO  Please call office at [phone] option 2 if you have any questions or concerns.   At the [facility], you and your health needs are our priority. As part of our continuing mission to provide you with exceptional heart care, we have created designated Provider Care Teams. These Care Teams include your primary Cardiologist (physician) and Advanced Practice Providers (APPs- Physician Assistants and Nurse Practitioners) who all work together to provide you with the care you need, when you need it.   You may see any of the following providers on your designated Care Team at your next follow up: Marland Kitchen Dr Arvilla Meres . Dr Marca Ancona . Tonye Becket, NP . Robbie Lis, PA . Karle Plumber, PharmD   Please be sure to bring in all your medications bottles to every appointment.

## 2019-10-03 ENCOUNTER — Other Ambulatory Visit (HOSPITAL_COMMUNITY): Payer: Self-pay | Admitting: Internal Medicine

## 2019-10-10 LAB — HEPATIC FUNCTION PANEL
ALT: 25 IU/L (ref 0–44)
AST: 16 IU/L (ref 0–40)
Albumin: 4.5 g/dL (ref 4.0–5.0)
Alkaline Phosphatase: 98 IU/L (ref 48–121)
Bilirubin Total: 0.4 mg/dL (ref 0.0–1.2)
Bilirubin, Direct: 0.12 mg/dL (ref 0.00–0.40)
Total Protein: 6.8 g/dL (ref 6.0–8.5)

## 2019-10-10 LAB — LIPID PANEL
Chol/HDL Ratio: 2.9 ratio (ref 0.0–5.0)
Cholesterol, Total: 135 mg/dL (ref 100–199)
HDL: 47 mg/dL (ref >39)
LDL Chol Calc (NIH): 68 mg/dL (ref 0–99)
Triglycerides: 112 mg/dL (ref 0–149)
VLDL Cholesterol Cal: 20 mg/dL (ref 5–40)

## 2019-10-15 ENCOUNTER — Telehealth: Payer: Self-pay | Admitting: Cardiology

## 2019-10-15 NOTE — Telephone Encounter (Signed)
New Message  1. What dental office are you calling from? [facility]   2. What is your office phone number? [phone]  3. What is your fax number? [phone]  4. What type of procedure is the patient having performed? Routine Periodontal Cleaning   5. What date is procedure scheduled or is the patient there now? 10/18/19 (if the patient is at the dentist's office question goes to their cardiologist if he/she is in the office.  If not, question should go to the DOD).   6. What is your question (ex. Antibiotics prior to procedure, holding medication-we need to know how long dentist wants pt to hold med)? Antibiotics prior

## 2019-10-15 NOTE — Telephone Encounter (Signed)
° °  Primary Cardiologist: Bryan Lemma, MD  Chart reviewed as part of pre-operative protocol coverage. Simple dental extractions are considered low risk procedures per guidelines and generally do not require any specific cardiac clearance. It is also generally accepted that for simple extractions and dental cleanings, there is no need to interrupt blood thinner therapy.   SBE prophylaxis is not required for the patient.  I will route this recommendation to the requesting party via Epic fax function and remove from pre-op pool.  Please call with questions.  Ronney Asters, NP 10/15/2019, 4:02 PM

## 2019-10-24 ENCOUNTER — Encounter (HOSPITAL_COMMUNITY): Payer: Self-pay | Admitting: Internal Medicine

## 2019-10-24 ENCOUNTER — Ambulatory Visit (HOSPITAL_BASED_OUTPATIENT_CLINIC_OR_DEPARTMENT_OTHER)
Admission: RE | Admit: 2019-10-24 | Discharge: 2019-10-24 | Disposition: A | Discharge status: Home / Self Care | Payer: Managed Care, Other (non HMO) | Source: Ambulatory Visit | Attending: Internal Medicine | Admitting: Internal Medicine

## 2019-10-24 ENCOUNTER — Ambulatory Visit (HOSPITAL_COMMUNITY)
Admission: RE | Admit: 2019-10-24 | Discharge: 2019-10-24 | Disposition: A | Discharge status: Home / Self Care | Payer: Managed Care, Other (non HMO) | Source: Ambulatory Visit | Attending: Internal Medicine | Admitting: Internal Medicine

## 2019-10-24 ENCOUNTER — Other Ambulatory Visit: Payer: Self-pay

## 2019-10-24 VITALS — BP 111/68 | HR 62 | Wt 208.0 lb

## 2019-10-24 DIAGNOSIS — Z79899 Other long term (current) drug therapy: Secondary | ICD-10-CM | POA: Diagnosis not present

## 2019-10-24 DIAGNOSIS — Z87891 Personal history of nicotine dependence: Secondary | ICD-10-CM | POA: Insufficient documentation

## 2019-10-24 DIAGNOSIS — I252 Old myocardial infarction: Secondary | ICD-10-CM | POA: Diagnosis not present

## 2019-10-24 DIAGNOSIS — Z8674 Personal history of sudden cardiac arrest: Secondary | ICD-10-CM | POA: Insufficient documentation

## 2019-10-24 DIAGNOSIS — Z8673 Personal history of transient ischemic attack (TIA), and cerebral infarction without residual deficits: Secondary | ICD-10-CM | POA: Diagnosis not present

## 2019-10-24 DIAGNOSIS — I2510 Atherosclerotic heart disease of native coronary artery without angina pectoris: Secondary | ICD-10-CM | POA: Insufficient documentation

## 2019-10-24 DIAGNOSIS — Z7901 Long term (current) use of anticoagulants: Secondary | ICD-10-CM | POA: Insufficient documentation

## 2019-10-24 DIAGNOSIS — I4901 Ventricular fibrillation: Secondary | ICD-10-CM | POA: Diagnosis present

## 2019-10-24 DIAGNOSIS — Z7189 Other specified counseling: Secondary | ICD-10-CM

## 2019-10-24 DIAGNOSIS — I4892 Unspecified atrial flutter: Secondary | ICD-10-CM | POA: Insufficient documentation

## 2019-10-24 DIAGNOSIS — Z955 Presence of coronary angioplasty implant and graft: Secondary | ICD-10-CM | POA: Insufficient documentation

## 2019-10-24 DIAGNOSIS — I5022 Chronic systolic (congestive) heart failure: Secondary | ICD-10-CM | POA: Insufficient documentation

## 2019-10-24 DIAGNOSIS — Z7902 Long term (current) use of antithrombotics/antiplatelets: Secondary | ICD-10-CM | POA: Diagnosis not present

## 2019-10-24 DIAGNOSIS — F329 Major depressive disorder, single episode, unspecified: Secondary | ICD-10-CM | POA: Diagnosis not present

## 2019-10-24 DIAGNOSIS — Z7982 Long term (current) use of aspirin: Secondary | ICD-10-CM | POA: Diagnosis not present

## 2019-10-24 HISTORY — PX: TRANSTHORACIC ECHOCARDIOGRAM: SHX275

## 2019-10-24 LAB — CBC
HCT: 43.7 % (ref 39.0–52.0)
Hemoglobin: 14 g/dL (ref 13.0–17.0)
MCH: 30.2 pg (ref 26.0–34.0)
MCHC: 32 g/dL (ref 30.0–36.0)
MCV: 94.2 fL (ref 80.0–100.0)
Platelets: 238 10*3/uL (ref 150–400)
RBC: 4.64 MIL/uL (ref 4.22–5.81)
RDW: 12.9 % (ref 11.5–15.5)
WBC: 6.2 10*3/uL (ref 4.0–10.5)
nRBC: 0 % (ref 0.0–0.2)

## 2019-10-24 LAB — COMPREHENSIVE METABOLIC PANEL
ALT: 31 U/L (ref 0–44)
AST: 24 U/L (ref 15–41)
Albumin: 4.1 g/dL (ref 3.5–5.0)
Alkaline Phosphatase: 83 U/L (ref 38–126)
Anion gap: 8 (ref 5–15)
BUN: 12 mg/dL (ref 6–20)
CO2: 25 mmol/L (ref 22–32)
Calcium: 9.5 mg/dL (ref 8.9–10.3)
Chloride: 107 mmol/L (ref 98–111)
Creatinine, Ser: 1.15 mg/dL (ref 0.61–1.24)
GFR calc Af Amer: >60 mL/min (ref >60)
GFR calc non Af Amer: >60 mL/min (ref >60)
Glucose, Bld: 93 mg/dL (ref 70–99)
Potassium: 4.4 mmol/L (ref 3.5–5.1)
Sodium: 140 mmol/L (ref 135–145)
Total Bilirubin: 0.8 mg/dL (ref 0.3–1.2)
Total Protein: 7.3 g/dL (ref 6.5–8.1)

## 2019-10-24 LAB — BRAIN NATRIURETIC PEPTIDE: B Natriuretic Peptide: 153.7 pg/mL — ABNORMAL HIGH (ref 0.0–100.0)

## 2019-10-24 LAB — TSH: TSH: 3.442 u[IU]/mL (ref 0.350–4.500)

## 2019-10-24 MED ORDER — DAPAGLIFLOZIN PROPANEDIOL 10 MG PO TABS
10.0000 mg | ORAL_TABLET | Freq: Every day | ORAL | 5 refills | Status: DC
Start: 2019-10-24 — End: 2020-04-04

## 2019-10-24 MED ORDER — AMIODARONE HCL 100 MG PO TABS: 100.0000 mg | ORAL_TABLET | Freq: Every day | ORAL | 3 refills | Status: DC

## 2019-10-24 NOTE — Addendum Note (Signed)
Encounter addended by: Chinita Pester, CMA on: 10/24/2019 4:11 PM  Actions taken: Order list changed

## 2019-10-24 NOTE — Progress Notes (Signed)
  Echocardiogram 2D Echocardiogram has been performed.  Edward Mendoza 10/24/2019, 3:10 PM

## 2019-10-24 NOTE — Addendum Note (Signed)
Encounter addended by: Noralee Space, RN on: 10/24/2019 4:16 PM  Actions taken: Clinical Note Signed

## 2019-10-24 NOTE — Patient Instructions (Signed)
Labs done today. We will contact you only if your labs are abnormal.  START Edward Mendoza 10mg (1 tablet) by mouth daily. (a pharmacy discount card was provided to you at your appointment)  DECREASE Amiodarone 100mg ( 1 tablet) by mouth daily.  STOP Digoxin  STOP Asprin  No other medication changes were made. Please continue all other medication changes as prescribed.  You have been referred to Dr. at [facility](across the street from [employer]) that office will contact you to schedule an appointment.   Your physician recommends that you schedule a follow-up appointment in: 4 months. Our office will contact you at a later date to schedule an appointment.  If you have any questions or concerns before your next appointment please send [facility] a message through [place] or call our office at [phone].    TO LEAVE A MESSAGE FOR THE NURSE SELECT OPTION 2, PLEASE LEAVE A MESSAGE INCLUDING: . YOUR NAME . DATE OF BIRTH . CALL BACK NUMBER . REASON FOR CALL**this is important as we prioritize the call backs  YOU WILL RECEIVE A CALL BACK THE SAME DAY AS LONG AS YOU CALL BEFORE 4:00 PM   Do the following things EVERYDAY: 1) Weigh yourself in the morning before breakfast. Write it down and keep it in a log. 2) Take your medicines as prescribed 3) Eat low salt foods--Limit salt (sodium) to 2000 mg per day.  4) Stay as active as you can everyday 5) Limit all fluids for the day to less than 2 liters   At the [facility], you and your health needs are our priority. As part of our continuing mission to provide you with exceptional heart care, we have created designated Provider Care Teams. These Care Teams include your primary Cardiologist (physician) and Advanced Practice Providers (APPs- Physician Assistants and Nurse Practitioners) who all work together to provide you with the care you need, when you need it.   You may see any of the following providers  on your designated Care Team at your next follow up: [address] [phone] . Dr Marland Kitchen . Arvilla Meres, NP . Marca Ancona, PA . Tonye Becket, PharmD   Please be sure to bring in all your medications bottles to every appointment.

## 2019-10-24 NOTE — Progress Notes (Addendum)
PCP: Cardiology: Dr Ellyn Hack Primary HF Cardiologist: Dr Haroldine Laws   HPI: Edward Mendoza is a 49 year old with history of CAD, STEMI, VF arrest 07/11/19,and CVAs.   On 07/11/19 presented with acute STEMI complicated by witnessed VF arrest who was brought to the ED 07/11/19. The patient had called EMS for stuttering chest pain for the last several days. He subsequently had a witnessed VF arrest with immediate resuscitation. He received a total of 6 external defibrillation and 2mg  epi and had emergency airway placed. He had a total of 40-45 minutes of ACLS before ROSC which was shortly after arrival to the ED. In the ED EKG demonstrated STEMI and he was transferred emergently to [facility] for cardiac cath. LHC showed 99% proximal LAD occlusion treated with PCI/DES to LAD. No other significant disease. He was started on DAPT with Aspirin and Brilinta. No statin started secondary to shock liver but later started as he improved. BB held in the setting of low output heart failure. Brilinta was stopped and plavix started with the addition of Eliquis (for aflutter). Patient worked with cardiac rehab and was able to ambulate up and down the halls with rollator. Did have intermittent pleuritic symptoms as well as chest wall soreness from CPR. TEE DC-CV 3/20 with conversion to NSR EF 30-35%. Discharged to home on 3/21/ 21.   Today he returns for HF follow up. Going to CR at [place]. Feels great. Resting BP at CR typically 96-110. No CP or SOB. No firing of LifeVest.   ECHO   07/14/19 EF 20-25%   LHC 07/11/19    Anterior STEMI complicated by VF arrest.    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  Otherwise minimal disease elsewhere.  Successful DES PCI of proximal LAD using resolute Onyx DES 3.5 x 18 mm postdilated to 4.0 mm.  Systolic hypotension with borderline cardiogenic shock with systolic pressures in the 90s over 60s requiring Levophed at 10 mcg/min. -->   Cardiac output-index 4.77-2.04; cardiac power 0.8.  No evidence of pulmonary hypertension  ROS: All systems negative except as listed in HPI, PMH and Problem List.  SH:  Social History   Socioeconomic History  . Marital status: Married    Spouse name: Not on file  . Number of children: Not on file  . Years of education: Not on file  . Highest education level: Not on file  Occupational History  . Occupation: Theatre stage manager: [employer]  Tobacco Use  . Smoking status: Former Smoker    Packs/day: 1.00    Years: 20.00    Pack years: 20.00    Types: Cigarettes    Start date: 2000    Quit date: 07/11/2019    Years since quitting: 0.2  . Smokeless tobacco: Never Used  Substance and Sexual Activity  . Alcohol use: Not Currently  . Drug use: Not Currently  . Sexual activity: Yes  Other Topics Concern  . Not on file  Social History Narrative  . Not on file   Social Determinants of Health   Financial Resource Strain:   . Difficulty of Paying Living Expenses:   Food Insecurity:   . Worried About Charity fundraiser in the Last Year:   . Arboriculturist in the Last Year:   Transportation Needs: No Transportation Needs  . Lack of Transportation (Medical): No  . Lack of Transportation (Non-Medical): No  Physical Activity: Sufficiently Active  . Days of Exercise  per Week: 7 days  . Minutes of Exercise per Session: 30 min  Stress:   . Feeling of Stress :   Social Connections:   . Frequency of Communication with Friends and Family:   . Frequency of Social Gatherings with Friends and Family:   . Attends Religious Services:   . Active Member of Clubs or Organizations:   . Attends Banker Meetings:   Marland Kitchen Marital Status:   Intimate Partner Violence:   . Fear of Current or Ex-Partner:   . Emotionally Abused:   Marland Kitchen Physically Abused:   . Sexually Abused:     FH: History reviewed. No pertinent family history.  Past Medical History:  Diagnosis Date    . Smoker 07/13/2019    Current Outpatient Medications  Medication Sig Dispense Refill  . amiodarone (PACERONE) 200 MG tablet Take 1 tablet (200 mg total) by mouth daily. 30 tablet 5  . apixaban (ELIQUIS) 5 MG TABS tablet Take 1 tablet (5 mg total) by mouth 2 (two) times daily. 180 tablet 4  . aspirin 81 MG chewable tablet Chew 1 tablet (81 mg total) by mouth daily. Stop 4/11 30 tablet 3  . atorvastatin (LIPITOR) 40 MG tablet Take 1 tablet (40 mg total) by mouth daily. 30 tablet 5  . carvedilol (COREG) 3.125 MG tablet Take 1 tablet (3.125 mg total) by mouth 2 (two) times daily. 60 tablet 5  . citalopram (CELEXA) 10 MG tablet Take 1 tablet (10 mg total) by mouth daily. 90 tablet 1  . clopidogrel (PLAVIX) 75 MG tablet Take 1 tablet (75 mg total) by mouth daily. 90 tablet 4  . digoxin (LANOXIN) 0.125 MG tablet Take 0.125 mg by mouth daily.    . pantoprazole (PROTONIX) 40 MG tablet Take 1 tablet (40 mg total) by mouth daily. 30 tablet 5  . sacubitril-valsartan (ENTRESTO) 24-26 MG Take 1 tablet by mouth 2 (two) times daily. 60 tablet 5  . spironolactone (ALDACTONE) 25 MG tablet Take 1 tablet (25 mg total) by mouth daily. 30 tablet 6  . furosemide (LASIX) 20 MG tablet Take 1 tablet (20 mg total) by mouth daily as needed. Use as needed for lower leg swelling (Patient not taking: Reported on 09/20/2019) 30 tablet 4  . nitroGLYCERIN (NITROSTAT) 0.4 MG SL tablet Place 1 tablet (0.4 mg total) under the tongue every 5 (five) minutes as needed for chest pain. (Patient not taking: Reported on 09/20/2019) 25 tablet 6   No current facility-administered medications for this encounter.    Vitals:   10/24/19 1516  BP: 111/68  Pulse: 62  SpO2: 100%  Weight: 94.3 kg (208 lb)    PHYSICAL EXAM: General:  Well appearing. No resp difficulty HEENT: normal scalp wound healed Neck: supple. no JVD. Carotids 2+ bilat; no bruits. No lymphadenopathy or thryomegaly appreciated. Cor: PMI nondisplaced. Regular rate &  rhythm. No rubs, gallops or murmurs. LifeVest in place Lungs: clear Abdomen: soft, nontender, nondistended. No hepatosplenomegaly. No bruits or masses. Good bowel sounds. Extremities: no cyanosis, clubbing, rash, edema Neuro: alert & orientedx3, cranial nerves grossly intact. moves all 4 extremities w/o difficulty. Affect pleasant   ASSESSMENT & PLAN:  1. CAD - LHC showed 99% proximal LAD thrombotic occlusion, s/p PCI + DES placement. - No s/s angina - Continue Plavix and Eliquis. Can stop ASA - Continue atorvastatin 40 mg daily. 2. Chronic Systolic Heart Failure  -Echo 4/53/64 EF 15%. RV systolic function mildly reduced  - f/u echo on 3/13 LVEF 20-25%, TEE 07/21/19  EF 30-35% - Echo today 30-35%. RV ok - NYHA II Volume status ok - SBP 90-110 No room to uptitrate meds.  - Continue carvedilol 3.125 mg twice a day  - Continue Entresto 24/26 bid -Continue spiro 12.5 mg daily.  - Add Farxiga 10  - Check labs - Refer for ICD 3. H/O VF Arrest in the setting of MI.  - Plan as above.  4. Multiple CVAs as on MRI  5. Depression -Continue celexa  6. Atrial flutter, paroxysmal - occurred post-MI, no recurrence. - drop amio to 100 - Continue Eliquis  Arvilla Meres, MD  3:51 PM

## 2019-10-24 NOTE — Progress Notes (Signed)
Dr Gala Romney advised pt he can RTW with no restrictions except rest breaks as needed. Pt had form with him, form completed with RTW date of 10/30/19 and signed by Dr Gala Romney, form given to pt

## 2019-10-26 LAB — T4: T4, Total: 8.5 ug/dL (ref 4.5–12.0)

## 2019-11-14 ENCOUNTER — Other Ambulatory Visit: Payer: Self-pay

## 2019-11-14 ENCOUNTER — Ambulatory Visit: Payer: Managed Care, Other (non HMO) | Admitting: Cardiology

## 2019-11-14 ENCOUNTER — Encounter: Payer: Self-pay | Admitting: Cardiology

## 2019-11-14 VITALS — BP 112/72 | HR 57 | Ht 73.0 in | Wt 212.6 lb

## 2019-11-14 DIAGNOSIS — I255 Ischemic cardiomyopathy: Secondary | ICD-10-CM | POA: Diagnosis not present

## 2019-11-14 DIAGNOSIS — I2102 ST elevation (STEMI) myocardial infarction involving left anterior descending coronary artery: Secondary | ICD-10-CM

## 2019-11-14 DIAGNOSIS — E785 Hyperlipidemia, unspecified: Secondary | ICD-10-CM

## 2019-11-14 DIAGNOSIS — I469 Cardiac arrest, cause unspecified: Secondary | ICD-10-CM

## 2019-11-14 DIAGNOSIS — I480 Paroxysmal atrial fibrillation: Secondary | ICD-10-CM

## 2019-11-14 DIAGNOSIS — Z8673 Personal history of transient ischemic attack (TIA), and cerebral infarction without residual deficits: Secondary | ICD-10-CM

## 2019-11-14 DIAGNOSIS — I2510 Atherosclerotic heart disease of native coronary artery without angina pectoris: Secondary | ICD-10-CM | POA: Diagnosis not present

## 2019-11-14 DIAGNOSIS — Z9861 Coronary angioplasty status: Secondary | ICD-10-CM

## 2019-11-14 DIAGNOSIS — I4901 Ventricular fibrillation: Secondary | ICD-10-CM

## 2019-11-14 NOTE — Patient Instructions (Signed)

## 2019-11-14 NOTE — Progress Notes (Signed)
Primary Care Provider: Lise Auer, MD Cardiologist: Bryan Lemma, MD Electrophysiologist: None  Clinic Note: Chief Complaint  Patient presents with  . Follow-up    Doing well  . Coronary Artery Disease    History of cardiac arrest from anterior MI, PCI to LAD  . Cardiomyopathy    No active heart failure symptoms (class I-II) at this point.  Has pending EP evaluation for ICD  . Irregular Heart Beat    History of V. fib arrest, possible sustained V. tach--pending EP evaluation     HPI:    Edward Mendoza is a 49 y.o. male (former smoker-quit March 2021) with a PMH notable for delayed presentation of Anterior MI complicated by out-of-hospital arrest, prolonged CPR who underwent LAD PCI with Arctic sun cooling protocol and prolonged ICU stay who presents today for initial interventional cardiology follow-up from his hospitalization.  He has been seen initially by Dr. Gala Romney who followed him while in the ICU.   07/11/2019: Chest pain-called EMS, upon EMS arrival, was talking and collapsed -> found to be in V. tach/V. Fib; prolonged CPR with mechanical pump, King airway placed in the field -> after 40 to 45 minutes of CPR arrived at [facility] Emergency Room-> CPR continued, additional doses of epinephrine administered and King airway exchanged for ETT > shortly thereafter restored ROSC with identification of significant anterior ST elevations.  Patient was making potential purposeful movements fighting ETT, therefore transferred emergently to [facility] Cath Lab -> borderline cardiogenic shock, 6 total shocks  95% proximal-mid LAD --Successful DES PCI of proximal LAD using resolute Onyx DES 3.5 x 18 mm postdilated to 4.0 mm.  EF by LV gram overestimated because of Levophed  Initial echo with EF of 15% on 07/11/2019 improved to 20-25% as of 07/14/2019 and 30-35% on TEE on 07/21/2019  Never required mechanical support and pressors were weaned.  Arctic sun with  rewarming.  Paroxysmal atrial flutter/fib -> TEE DCCV on 07/21/2019 -> Eliquis added and converted from Brilinta to Plavix.  Maintained on amiodarone.  08/07/2019: Advanced heart failure clinic follow-up with Dr. Gala Romney --> feeling much better.  Had some left thumb and index finger numbness but no heart failure symptoms or angina.  SBP was borderline low in the 90s.  Walking 15 minutes twice a day.  A little bit tired after walking.  Starting to put back on some weight.;  Started [facility] at [facility], but stopped because of hypotension.  Only noted some chest soreness from CPR.  Quit smoking.   Follow-up echo ordered for 3 months post arrest.  Tolerating low-dose carvedilol and losartan along with spironolactone with plans to switch to [facility] once BP will tolerate.  08/21/2019: Follow-up at Advanced [facility]F clinic -> Amy Clegg, NP-> feeling much stronger.  Walking 30 minutes/day and attending cardiac rehab 3 days a week.  Only occasional palpitations.  Appetite getting back to normal.   Converted from losartan to low-dose Entresto.    Edward Mendoza was last seen on on June 23 by Dr. Gala Romney in the Advanced CHF clinic.  EF is improved from 50% up to 30 to 35%, was noting NYHA class II symptoms.  Borderline pressures in the 90s to 110 mmHg range with no room to uptitrate meds.  On initial dose of carvedilol, Entresto & spironolactone.    Started on Farxiga, and reduced dose of amiodarone to 100 mg daily.  Digoxin stop aspirin stopped  Continued on Eliquis and Plavix.    Event monitor  ordered for palpitations to exclude recurrent A. fib  Referred for ICD.  Recent Hospitalizations: None since March 2021  Reviewed  CV studies:    The following studies were reviewed today: (if available, images/films reviewed: From Epic Chart or Care Everywhere) . Cardiac Cath 07/11/2019: Anterior STEMI with prolonged VF arrest -> proximal-mid LAD 95% hazy lesion (DES PCI Resolute Onyx 3.5 mm 18 mm-at 4.0  mm), otherwise minimal disease.  EF estimated 35 to 45%-likely hyperdynamic on vasopressors.  Relatively normal right heart cath pressures _> PCWP 20 mmHg.  LVEDP 18 mmHg. Cardiac output-index by Fick: 4.77-2.04.     o Echo 07/11/2019: EF estimated 15%.  Severely reduced function with global HK.  Moderate LVH.  Mild RV hypokinesis.  Dilated IVC with retrograde CVP estimated 15 mmHg o Echo 07/14/2019: EF 20 to 25%.  Entire anteroseptal, anterior and apical wall severe hypokinesis with moderate HK of mid-anterior inferior wall.  (Improved EF). 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  Innumerable punctate acute infarctions scattered throughout the cerebellum in both cerebral hemispheres most consistent with micro emboli.  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  Mid-apical anterior and apical hypo to akinesis.  No LAA. ->  Successful DCCV of Atrial Flutter. . 7-day event monitor May 2021: Sinus rhythm average rate 66 bpm.  1 degree AVB.  Rare PACs and PVCs.  No arrhythmia. . Echo October 24, 2019: EF 35 to 40%.  Moderately dilated LV indeterminate filling pressures.  Akinetic mid anteroseptal wall as well as apical anterior and apical septal wall.  Otherwise normal valves and remaining chambers are normal.  History of PFO but no shunting.   Interval History:   Edward Mendoza is here today for follow-up doing remarkably well.  He is back to work.  He is actually asking about need to go back to the gym doing light weights and physical conditioning.  He is exercising routinely finishing up cardiac rehab.  He stated that the rehab was beneficial more because of the psychosocial compromise as opposed to the physical component.  Both he and his wife benefited greatly from this and he will be forever thankful for better support.  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  Remarkably, he seems to be clear from a neurologic standpoint.  He does not have any recollection of the time period of time from the time the paramedics arrived until he woke up in the ICU.  He has a vague recollection of being in the Cath Lab, but no detailed memory.  He clearly was foggy for the first week or 2 but not seem to clear up prior to even starting cardiac rehab.  About the only thing he is noted has been occasional intermittent fluttering sensations of heart rate going up faster than usual not associated with vigorous activity, but with mild activity.  He tends to think this is more related to anxiety as he has had a couple spells of feeling anxious and nervous when he recalls his near death experience.  CV Review of Symptoms (Summary) Cardiovascular ROS: no chest pain or dyspnea on exertion positive for - palpitations, rapid heart rate and Short-lived, usually associated with anxiety negative for - edema, orthopnea, paroxysmal nocturnal dyspnea, shortness of breath or Lightheadedness, dizziness or wooziness, syncope/near syncope or TIA/amaurosis  fugax, claudication  The patient does not have symptoms concerning for COVID-19 infection (fever, chills, cough, or new shortness of breath).  The patient is practicing social distancing & Masking.    REVIEWED OF SYSTEMS   Review of Systems  Constitutional: Positive for weight loss (He will also significant amount of weight actually leading up to his MI but lost more in the hospital.;  He is actually is finally now starting to put back on a little bit of his weight and muscle mass.). Negative for malaise/fatigue.  HENT: Negative for nosebleeds.   Respiratory: Negative for shortness of breath.   Gastrointestinal: Negative for blood in stool and melena.  Genitourinary: Negative for hematuria.  Musculoskeletal: Negative for joint pain.  Neurological: Negative for dizziness and  headaches.  Psychiatric/Behavioral: Positive for memory loss (Isolated memory loss from the time of his cardiac arrest until waking up in the ICU.). Negative for depression. The patient is nervous/anxious (He has had intermittent very short-lived episodes where he feels somewhat anxious.). The patient does not have insomnia.   All other systems reviewed and are negative.    I have reviewed and (if needed) personally updated the patient's problem list, medications, allergies, past medical and surgical history, social and family history.   PAST MEDICAL HISTORY   Past Medical History:  Diagnosis Date  . Acute ST elevation myocardial infarction (STEMI) involving left anterior descending coronary artery (HCC)    Presented as VF arrest -> 45 minutes CPR with ROSC showing anterior ST elevation: Cath revealed 95% thrombotic/hazy proximal-mid LAD -> DES PCI. ->  Severe Ischemic Cardiomyopathy (EF improved from 15% to 35-40%)  . Cardiac arrest with ventricular fibrillation (HCC) 07/11/2019  . History of multiple strokes 07/22/2019   Multiple CVAs noted on MRI post CARDIAC ARREST-prolonged CPR (innumerable punctate acute infarctions scattered throughout the cerebellum and s bilateral cerebellar hemispheres most consistent with micro emboli) no residual symptoms.  . Hyperlipidemia with target LDL less than 70 07/22/2019  . Ischemic cardiomyopathy -> following anterior MI and cardiac arrest-initial EF 15% improved to 35-40% 3 months post PCI. 11/17/2019  . Paroxysmal atrial fibrillation/a atrial flutter ->  in setting of post MI/cardiac arrest 07/18/2019   Had PAF-flutter following rewarming from Arctic sun after cardiac arrest: TEE DCCV on 07/21/2019  . Smoker 07/13/2019    PAST SURGICAL HISTORY   Past Surgical History:  Procedure Laterality Date  . ARTERIAL LINE INSERTION N/A 07/11/2019   Procedure: ARTERIAL LINE INSERTION;  Surgeon: Marykay Lex, MD;  Location: [facility] INVASIVE CV LAB;  Service:  Cardiovascular;  Laterality: N/A;  . CARDIOVERSION N/A 07/21/2019   Procedure: CARDIOVERSION;  Surgeon: Dolores Patty, MD;  Location: [facility] ENDOSCOPY;  Service: Cardiovascular;; TTE - DCCV: Atrial flutter-restored sinus rhythm  . CORONARY STENT INTERVENTION N/A 07/11/2019   Procedure: CORONARY STENT INTERVENTION;  Surgeon: Marykay Lex, MD;  Location: [facility] INVASIVE CV LAB;  Service: Cardiovascular;;  proximal-mid LAD 95% hazy lesion (DES PCI Resolute Onyx 3.5 mm 18 mm-at 4.0 mm),  . CORONARY/GRAFT ACUTE MI REVASCULARIZATION N/A 07/11/2019   Procedure: Coronary/Graft Acute MI Revascularization;  Surgeon: Marykay Lex, MD;  Location: [facility] INVASIVE CV LAB;  DES PCI of p-m LAD 95-99%.  Marland Kitchen RIGHT/LEFT HEART CATH AND CORONARY ANGIOGRAPHY N/A 07/11/2019   Procedure: RIGHT/LEFT HEART CATH AND CORONARY ANGIOGRAPHY;  Surgeon: Marykay Lex, MD;  Location: [facility] INVASIVE CV LAB:: Ant STEMI/Cardiac Arrest:  p-mLAD 95-99% thrombotic stenosis --> DES PCI. EF ~35%. Normal RHC Pressures. PCWP ~20 mmHg with LVEDP ~  18 mmHg.  CO-CI 4.77 - 2.04 (no MCS placed)   . TEE WITHOUT CARDIOVERSION N/A 07/21/2019   Procedure: TRANSESOPHAGEAL ECHOCARDIOGRAM (TEE) W/ CARDIOVERSION;  Surgeon: Dolores Patty, MD;  Location: [facility] ENDOSCOPY;;  EF 30 to 35%.  Mid-apical anterior and apical hypo to akinesis.  No LAA. ->  Successful DCCV of Atrial Flutter.  . TRANSTHORACIC ECHOCARDIOGRAM  07/2019   a) Initial Post PCI-postarrest: EF~15%.  Severely reduced with global HK.  Moderate LVH.  Mildly dilated IVC.; b) 07/14/2019: EF 20 to 25%.  Entire anteroseptal, anterior and apical wall severe hypokinesis with moderate HK of mid-anterior inferior wall.  (Improved EF).  . TRANSTHORACIC ECHOCARDIOGRAM  10/24/2019   EF 35 to 40%.  Moderately dilated LV indeterminate filling pressures.  Akinetic mid anteroseptal wall as well as apical anterior and apical septal wall.  Otherwise normal valves and remaining chambers are normal.  History of PFO but  no shunting.    MEDICATIONS/ALLERGIES   Current Meds  Medication Sig  . amiodarone (PACERONE) 100 MG tablet Take 1 tablet (100 mg total) by mouth daily.  Marland Kitchen apixaban (ELIQUIS) 5 MG TABS tablet Take 1 tablet (5 mg total) by mouth 2 (two) times daily.  Marland Kitchen atorvastatin (LIPITOR) 40 MG tablet Take 1 tablet (40 mg total) by mouth daily.  . carvedilol (COREG) 3.125 MG tablet Take 1 tablet (3.125 mg total) by mouth 2 (two) times daily.  . citalopram (CELEXA) 10 MG tablet Take 1 tablet (10 mg total) by mouth daily.  . clopidogrel (PLAVIX) 75 MG tablet Take 1 tablet (75 mg total) by mouth daily.  . dapagliflozin propanediol (FARXIGA) 10 MG TABS tablet Take 1 tablet (10 mg total) by mouth daily before breakfast.  . furosemide (LASIX) 20 MG tablet Take 1 tablet (20 mg total) by mouth daily as needed. Use as needed for lower leg swelling  . nitroGLYCERIN (NITROSTAT) 0.4 MG SL tablet Place 1 tablet (0.4 mg total) under the tongue every 5 (five) minutes as needed for chest pain.  . pantoprazole (PROTONIX) 40 MG tablet Take 1 tablet (40 mg total) by mouth daily.  . sacubitril-valsartan (ENTRESTO) 24-26 MG Take 1 tablet by mouth 2 (two) times daily.  Marland Kitchen spironolactone (ALDACTONE) 25 MG tablet Take 1 tablet (25 mg total) by mouth daily.    No Known Allergies  SOCIAL HISTORY/FAMILY HISTORY   Reviewed in Epic:  Pertinent findings: He is now back to work.  Also working out at [employer] currently [employer].  Routinely exercising walking at least 30 minutes twice a day.  Getting physically stronger, but has noted muscle mass loss.  OBJCTIVE -PE, EKG, labs   Wt Readings from Last 3 Encounters:  11/14/19 212 lb 9.6 oz (96.4 kg)  10/24/19 208 lb (94.3 kg)  09/20/19 202 lb 3.2 oz (91.7 kg)    Physical Exam: BP 112/72   Pulse (!) 57   Ht  (1.854 m)   Wt 212 lb 9.6 oz (96.4 kg)   SpO2 100%   BMI 28.05 kg/m  Physical Exam Vitals reviewed.  Constitutional:      General: He is not in acute  distress.    Appearance: Normal appearance. He is normal weight.     Comments: Remarkably healthy-appearing.  HENT:     Head: Normocephalic and atraumatic.  Eyes:     Extraocular Movements: Extraocular movements intact.     Pupils: Pupils are equal, round, and reactive to light.  Neck:     Vascular: No carotid bruit  or JVD.  Cardiovascular:     Rate and Rhythm: Normal rate and regular rhythm.     Pulses: Normal pulses.     Heart sounds: No murmur heard.  No friction rub. No gallop.   Abdominal:     General: Abdomen is flat. Bowel sounds are normal. There is no distension.     Palpations: Abdomen is soft.     Comments: No HSM or mass  Musculoskeletal:        General: No swelling. Normal range of motion.     Cervical back: Normal range of motion and neck supple.  Neurological:     General: No focal deficit present.     Mental Status: He is alert and oriented to person, place, and time.  Psychiatric:        Mood and Affect: Mood normal.        Behavior: Behavior normal.        Thought Content: Thought content normal.        Judgment: Judgment normal.     Comments: Remarkably clear     Adult ECG Report  Rate: 57 ;  Rhythm: sinus bradycardia, sinus arrhythmia and Anterolateral T wave inversions-cannot exclude ischemia (appearance of continued evolutionary changes from anterior MI);   Narrative Interpretation: Stable  Recent Labs:    Lab Results  Component Value Date   CHOL 135 10/09/2019   HDL 47 10/09/2019   LDLCALC 68 10/09/2019   TRIG 112 10/09/2019   CHOLHDL 2.9 10/09/2019   Lab Results  Component Value Date   CREATININE 1.15 10/24/2019   BUN 12 10/24/2019   NA 140 10/24/2019   K 4.4 10/24/2019   CL 107 10/24/2019   CO2 25 10/24/2019   Lab Results  Component Value Date   TSH 3.442 10/24/2019    ASSESSMENT/PLAN    Problem List Items Addressed This Visit    Acute ST elevation myocardial infarction (STEMI) involving left anterior descending coronary artery  (HCC) (Chronic)    Large anterior MI complicated by VF arrest.  Had successful DES PCI of the LAD.  No recurrent anginal symptoms.  Has significant cardiomyopathy as a result, but class I-II CHF symptoms.      Ischemic cardiomyopathy -> following anterior MI and cardiac arrest-initial EF 15% improved to 35-40% 3 months post PCI. (Chronic)    He has pretty significant cardiomyopathy with anterior wall MI and EF now 30 to 35% improved from initial 15%.  He is actually probably only having NYHA class I CHF symptoms.  Euvolemic on exam.  Comanaged with Dr. Gala Romney from advanced heart failure -> will defer medication adjustment to Dr. Gala Romney for now.  He is on excellent regimen with low-dose carvedilol, Entresto and spironolactone.  Borderline blood pressures preclude further titration. We will started on Farxiga, and has not had any diuretic requirement besides the spironolactone.  EP referral for ICD      CAD S/P DES PCI of LAD (Chronic)    Successful DES PCI of proximal LAD.  Brilinta converted to Plavix because of the need for concommitment DOAC.  He is on statin, beta-blocker, ARB/Entresto and spironolactone.  Stable with no active angina      Paroxysmal atrial fibrillation/a atrial flutter ->  in setting of post MI/cardiac arrest (Chronic)    PAF-flutter in setting of rewarming from Arctic sun.  Status post TEE DCCV on 07/21/2019. No recurrent events on event monitor. Discharged on amiodarone with dose recently reduced to 100 mg daily.  Appears to be  maintaining sinus rhythm. -Is also on low-dose beta-blocker  Plan for now will be to continue Eliquis for antiarrhythmia (Brilinta was converted to Plavix, aspirin discontinued).  I suspect that we can probably stop the amiodarone but would defer to consultation with Dr. Ladona Ridgelaylor at the end of the month.      Hyperlipidemia with target LDL less than 70 (Chronic)    Labs were just checked for first time post hospital with LDL of 68.   Well within goal. Continue 40 mg atorvastatin for now. ->  Would likely need recheck by the end of the year.      Cardiac arrest with ventricular fibrillation (HCC) (Chronic)    This was in the setting of anterior MI with prolonged CPR.  Amazingly he has had almost complete recovery with exception of cardiomyopathy.  EF still remains 30 to 35%.  Has appointment with Dr. Ladona Ridgelaylor from EP at the end of the month to discuss ICD.  Currently maintaining sinus rhythm with no breakthrough episodes on amiodarone and carvedilol. Defer duration and timing of amiodarone therapy to Dr. Ladona Ridgelaylor.      History of multiple strokes (Chronic)    Noted on MRI.  No residual effects.  This is quite likely from prolonged CPR.  However with PAF/flutter noted, would continue with Eliquis and Plavix.       Other Visit Diagnoses    Coronary artery disease involving native coronary artery of native heart without angina pectoris    -  Primary   Relevant Orders   EKG 12-Lead (Completed)       COVID-19 Education: The signs and symptoms of COVID-19 were discussed with the patient and how to seek care for testing (follow up with PCP or arrange E-visit).   The importance of social distancing and COVID-19 vaccination was discussed today.  I spent a total of 30 minutes with the patient. >  50% of the time was spent in direct patient consultation.  --> As this was our first encounter for he actually recalls who I am and what has happened.  We reviewed his cath films, echo results as well as his presentation.  He had several questions about what happened but were answered.  His wife was on the telephone and she herself also had questions all answered.  Additional time spent with chart review  / charting (studies, outside notes, etc): 15 Total Time: 45 min   Current medicines are reviewed at length with the patient today.  (+/- concerns) none  Notice: This dictation was prepared with Dragon dictation along with  smaller phrase technology. Any transcriptional errors that result from this process are unintentional and may not be corrected upon review.  Patient Instructions / Medication Changes & Studies & Tests Ordered   Patient Instructions  Medication Instructions:  No changes  *If you need a refill on your cardiac medications before your next appointment, please call your pharmacy*   Lab Work: Not needed   Testing/Procedures: Not needed   Follow-Up: At [facility], you and your health needs are our priority.  As part of our continuing mission to provide you with exceptional heart care, we have created designated Provider Care Teams.  These Care Teams include your primary Cardiologist (physician) and Advanced Practice Providers (APPs -  Physician Assistants and Nurse Practitioners) who all work together to provide you with the care you need, when you need it.    Your next appointment:   6 month(s)  The format for your next appointment:  In Person  Provider:   Bryan Lemma, MD      Studies Ordered:   Orders Placed This Encounter  Procedures  . EKG 12-Lead     Bryan Lemma, M.D., M.S. Interventional Cardiologist   Pager # [phone] Phone # [phone] [address] [place], [place] [address]   Thank you for choosing Heartcare at [facility]!!

## 2019-11-17 ENCOUNTER — Encounter: Payer: Self-pay | Admitting: Cardiology

## 2019-11-17 ENCOUNTER — Other Ambulatory Visit: Payer: Self-pay | Admitting: Medical

## 2019-11-17 DIAGNOSIS — I255 Ischemic cardiomyopathy: Secondary | ICD-10-CM

## 2019-11-17 HISTORY — DX: Ischemic cardiomyopathy: I25.5

## 2019-11-17 NOTE — Assessment & Plan Note (Signed)
Noted on MRI.  No residual effects.  This is quite likely from prolonged CPR.  However with PAF/flutter noted, would continue with Eliquis and Plavix.

## 2019-11-17 NOTE — Assessment & Plan Note (Signed)
Labs were just checked for first time post hospital with LDL of 68.  Well within goal. Continue 40 mg atorvastatin for now. ->  Would likely need recheck by the end of the year.

## 2019-11-17 NOTE — Assessment & Plan Note (Signed)
He has pretty significant cardiomyopathy with anterior wall MI and EF now 30 to 35% improved from initial 15%.  He is actually probably only having NYHA class I CHF symptoms.  Euvolemic on exam.  Comanaged with Dr. Gala Romney from advanced heart failure -> will defer medication adjustment to Dr. Gala Romney for now.  He is on excellent regimen with low-dose carvedilol, Entresto and spironolactone.  Borderline blood pressures preclude further titration. We will started on Farxiga, and has not had any diuretic requirement besides the spironolactone.  EP referral for ICD

## 2019-11-17 NOTE — Assessment & Plan Note (Signed)
Large anterior MI complicated by VF arrest.  Had successful DES PCI of the LAD.  No recurrent anginal symptoms.  Has significant cardiomyopathy as a result, but class I-II CHF symptoms.

## 2019-11-17 NOTE — Assessment & Plan Note (Signed)
This was in the setting of anterior MI with prolonged CPR.  Amazingly he has had almost complete recovery with exception of cardiomyopathy.  EF still remains 30 to 35%.  Has appointment with Dr. Ladona Ridgel from EP at the end of the month to discuss ICD.  Currently maintaining sinus rhythm with no breakthrough episodes on amiodarone and carvedilol. Defer duration and timing of amiodarone therapy to Dr. Ladona Ridgel.

## 2019-11-17 NOTE — Assessment & Plan Note (Addendum)
PAF-flutter in setting of rewarming from [employer].  Status post TEE DCCV on 07/21/2019. No recurrent events on event monitor. Discharged on amiodarone with dose recently reduced to 100 mg daily.  Appears to be maintaining sinus rhythm. -Is also on low-dose beta-blocker  Plan for now will be to continue Eliquis for antiarrhythmia (Brilinta was converted to Plavix, aspirin discontinued).  I suspect that we can probably stop the amiodarone but would defer to consultation with Dr. Ladona Ridgel at the end of the month.

## 2019-11-17 NOTE — Assessment & Plan Note (Signed)
Successful DES PCI of proximal LAD.  Brilinta converted to Plavix because of the need for concommitment DOAC.  He is on statin, beta-blocker, ARB/Entresto and spironolactone.  Stable with no active angina

## 2019-11-19 ENCOUNTER — Telehealth: Payer: Self-pay | Admitting: Medical

## 2019-11-19 MED ORDER — CITALOPRAM HYDROBROMIDE 10 MG PO TABS: 10.0000 mg | ORAL_TABLET | Freq: Every day | ORAL | 3 refills | Status: DC

## 2019-11-19 NOTE — Telephone Encounter (Signed)
   *  STAT* If patient is at the pharmacy, call can be transferred to refill team.   1. Which medications need to be refilled? (please list name of each medication and dose if known)   citalopram (CELEXA) 10 MG tablet     2. Which pharmacy/location (including street and city if local pharmacy) is medication to be sent to?[id number] - [place], [place] - [address]  3. Do they need a 30 day or 90 day supply? 90 days   Pt would like to make sure this med get refilled today since he's been out of medications

## 2019-11-19 NOTE — Telephone Encounter (Signed)
OK to fill.  Jmichael Gille, MD  

## 2019-11-19 NOTE — Telephone Encounter (Signed)
Per Dr [name]. Okay to refill x3 refills  Quantity #90.  Patient aware E-sent medication to pharmacy

## 2019-11-20 ENCOUNTER — Ambulatory Visit (INDEPENDENT_AMBULATORY_CARE_PROVIDER_SITE_OTHER): Payer: Managed Care, Other (non HMO) | Admitting: Internal Medicine

## 2019-11-20 ENCOUNTER — Other Ambulatory Visit: Payer: Self-pay

## 2019-11-20 ENCOUNTER — Encounter: Payer: Self-pay | Admitting: Internal Medicine

## 2019-11-20 VITALS — BP 100/70 | HR 66 | Ht 73.0 in | Wt 210.8 lb

## 2019-11-20 DIAGNOSIS — I4901 Ventricular fibrillation: Secondary | ICD-10-CM | POA: Diagnosis not present

## 2019-11-20 DIAGNOSIS — I469 Cardiac arrest, cause unspecified: Secondary | ICD-10-CM

## 2019-11-20 DIAGNOSIS — I4892 Unspecified atrial flutter: Secondary | ICD-10-CM | POA: Diagnosis not present

## 2019-11-20 DIAGNOSIS — I5022 Chronic systolic (congestive) heart failure: Secondary | ICD-10-CM

## 2019-11-20 NOTE — Progress Notes (Signed)
HPI Edward Mendoza is referred today by Dr. Dorthea Cove to consider ICD insertion. He is a very pleasant 49 yo man with tobacco abuse who presented with an acute MI and VF back in 4/21. He underwent PCI and multiple shocks. He has gradually improved and is tolerating optimal medical therapy with beta blocker, entresto and aldactone. His course was complicated by atrial fib and he has been on systemic anti-coagulation and low dose amiodarone. He has had a remarkable recovery. He states that he has no limit to physical activity though he notes he gets tired when he performs any heavy activity.  No Known Allergies   Current Outpatient Medications  Medication Sig Dispense Refill  . amiodarone (PACERONE) 100 MG tablet Take 1 tablet (100 mg total) by mouth daily. 90 tablet 3  . apixaban (ELIQUIS) 5 MG TABS tablet Take 1 tablet (5 mg total) by mouth 2 (two) times daily. 180 tablet 4  . atorvastatin (LIPITOR) 40 MG tablet Take 1 tablet (40 mg total) by mouth daily. 30 tablet 5  . carvedilol (COREG) 3.125 MG tablet Take 1 tablet (3.125 mg total) by mouth 2 (two) times daily. 60 tablet 5  . citalopram (CELEXA) 10 MG tablet Take 1 tablet (10 mg total) by mouth daily. 90 tablet 3  . clopidogrel (PLAVIX) 75 MG tablet Take 1 tablet (75 mg total) by mouth daily. 90 tablet 4  . dapagliflozin propanediol (FARXIGA) 10 MG TABS tablet Take 1 tablet (10 mg total) by mouth daily before breakfast. 30 tablet 5  . furosemide (LASIX) 20 MG tablet Take 1 tablet (20 mg total) by mouth daily as needed. Use as needed for lower leg swelling 30 tablet 4  . nitroGLYCERIN (NITROSTAT) 0.4 MG SL tablet Place 1 tablet (0.4 mg total) under the tongue every 5 (five) minutes as needed for chest pain. 25 tablet 6  . pantoprazole (PROTONIX) 40 MG tablet Take 1 tablet (40 mg total) by mouth daily. 30 tablet 5  . sacubitril-valsartan (ENTRESTO) 24-26 MG Take 1 tablet by mouth 2 (two) times daily. 60 tablet 5  . spironolactone (ALDACTONE) 25 MG  tablet Take 1 tablet (25 mg total) by mouth daily. 30 tablet 6   No current facility-administered medications for this visit.     Past Medical History:  Diagnosis Date  . Acute ST elevation myocardial infarction (STEMI) involving left anterior descending coronary artery (HCC)    Presented as VF arrest -> 45 minutes CPR with ROSC showing anterior ST elevation: Cath revealed 95% thrombotic/hazy proximal-mid LAD -> DES PCI. ->  Severe Ischemic Cardiomyopathy (EF improved from 15% to 35-40%)  . Cardiac arrest with ventricular fibrillation (HCC) 07/11/2019  . History of multiple strokes 07/22/2019   Multiple CVAs noted on MRI post CARDIAC ARREST-prolonged CPR (innumerable punctate acute infarctions scattered throughout the cerebellum and s bilateral cerebellar hemispheres most consistent with micro emboli) no residual symptoms.  . Hyperlipidemia with target LDL less than 70 07/22/2019  . Ischemic cardiomyopathy -> following anterior MI and cardiac arrest-initial EF 15% improved to 35-40% 3 months post PCI. 11/17/2019  . Paroxysmal atrial fibrillation/a atrial flutter ->  in setting of post MI/cardiac arrest 07/18/2019   Had PAF-flutter following rewarming from [facility] after cardiac arrest: TEE DCCV on 07/21/2019  . Smoker 07/13/2019    ROS:   All systems reviewed and negative except as noted in the HPI.   Past Surgical History:  Procedure Laterality Date  . ARTERIAL LINE INSERTION N/A 07/11/2019  Procedure: ARTERIAL LINE INSERTION;  Surgeon: Edward Lex, MD;  Location: [facility] INVASIVE CV LAB;  Service: Cardiovascular;  Laterality: N/A;  . CARDIOVERSION N/A 07/21/2019   Procedure: CARDIOVERSION;  Surgeon: Edward Patty, MD;  Location: [facility] ENDOSCOPY;  Service: Cardiovascular;; TTE - DCCV: Atrial flutter-restored sinus rhythm  . CORONARY STENT INTERVENTION N/A 07/11/2019   Procedure: CORONARY STENT INTERVENTION;  Surgeon: Edward Lex, MD;  Location: [facility] INVASIVE CV LAB;  Service:  Cardiovascular;;  proximal-mid LAD 95% hazy lesion (DES PCI Resolute Onyx 3.5 mm 18 mm-at 4.0 mm),  . CORONARY/GRAFT ACUTE MI REVASCULARIZATION N/A 07/11/2019   Procedure: Coronary/Graft Acute MI Revascularization;  Surgeon: Edward Lex, MD;  Location: [facility] INVASIVE CV LAB;  DES PCI of p-m LAD 95-99%.  Marland Kitchen RIGHT/LEFT HEART CATH AND CORONARY ANGIOGRAPHY N/A 07/11/2019   Procedure: RIGHT/LEFT HEART CATH AND CORONARY ANGIOGRAPHY;  Surgeon: Edward Lex, MD;  Location: [facility] INVASIVE CV LAB:: Ant STEMI/Cardiac Arrest:  p-mLAD 95-99% thrombotic stenosis --> DES PCI. EF ~35%. Normal RHC Pressures. PCWP ~20 mmHg with LVEDP ~18 mmHg.  CO-CI 4.77 - 2.04 (no MCS placed)   . TEE WITHOUT CARDIOVERSION N/A 07/21/2019   Procedure: TRANSESOPHAGEAL ECHOCARDIOGRAM (TEE) W/ CARDIOVERSION;  Surgeon: Edward Patty, MD;  Location: [facility] ENDOSCOPY;;  EF 30 to 35%.  Mid-apical anterior and apical hypo to akinesis.  No LAA. ->  Successful DCCV of Atrial Flutter.  . TRANSTHORACIC ECHOCARDIOGRAM  07/2019   a) Initial Post PCI-postarrest: EF~15%.  Severely reduced with global HK.  Moderate LVH.  Mildly dilated IVC.; b) 07/14/2019: EF 20 to 25%.  Entire anteroseptal, anterior and apical wall severe hypokinesis with moderate HK of mid-anterior inferior wall.  (Improved EF).  . TRANSTHORACIC ECHOCARDIOGRAM  10/24/2019   EF 35 to 40%.  Moderately dilated LV indeterminate filling pressures.  Akinetic mid anteroseptal wall as well as apical anterior and apical septal wall.  Otherwise normal valves and remaining chambers are normal.  History of PFO but no shunting.     No family history on file.   Social History   Socioeconomic History  . Marital status: Married    Spouse name: Not on file  . Number of children: Not on file  . Years of education: Not on file  . Highest education level: Not on file  Occupational History  . Occupation: Writer: [employer]  Tobacco Use  . Smoking status: Former  Smoker    Packs/day: 1.00    Years: 20.00    Pack years: 20.00    Types: Cigarettes    Start date: 2000    Quit date: 07/11/2019    Years since quitting: 0.3  . Smokeless tobacco: Never Used  Substance and Sexual Activity  . Alcohol use: Not Currently  . Drug use: Not Currently  . Sexual activity: Yes  Other Topics Concern  . Not on file  Social History Narrative  . Not on file   Social Determinants of Health   Financial Resource Strain:   . Difficulty of Paying Living Expenses:   Food Insecurity:   . Worried About Programme researcher, broadcasting/film/video in the Last Year:   . Barista in the Last Year:   Transportation Needs: No Transportation Needs  . Lack of Transportation (Medical): No  . Lack of Transportation (Non-Medical): No  Physical Activity: Sufficiently Active  . Days of Exercise per Week: 7 days  . Minutes of Exercise per Session: 30 min  Stress:   .  Feeling of Stress :   Social Connections:   . Frequency of Communication with Friends and Family:   . Frequency of Social Gatherings with Friends and Family:   . Attends Religious Services:   . Active Member of Clubs or Organizations:   . Attends Banker Meetings:   Marland Kitchen Marital Status:   Intimate Partner Violence:   . Fear of Current or Ex-Partner:   . Emotionally Abused:   Marland Kitchen Physically Abused:   . Sexually Abused:      BP 100/70   Pulse 66   Ht 6\' 1"  (1.854 m)   Wt 210 lb 12.8 oz (95.6 kg)   SpO2 99%   BMI 27.81 kg/m   Physical Exam:  Well appearing 49 yo man, NAD HEENT: Unremarkable Neck:  No JVD, no thyromegally Lymphatics:  No adenopathy Back:  No CVA tenderness Lungs:  Clear with no wheezes; life vest is in place HEART:  Regular rate rhythm, no murmurs, no rubs, no clicks Abd:  soft, positive bowel sounds, no organomegally, no rebound, no guarding Ext:  2 plus pulses, no edema, no cyanosis, no clubbing Skin:  No rashes no nodules Neuro:  CN II through XII intact, motor grossly  intact  EKG - NSR  Assess/Plan: 1. ICM - he denies anginal symptoms and is on maximal medical therapy. His EF is 35-40% by echo. I have recommended a cardiac MRI in September. If his EF remains above 35%, ICD insertion is not indicated. I would remove the life vest at that time. If his EF is below 36%, then ICD insertion is warranted. We will contact him after the cardiac MRI.  2. Chronic systolic heart failure - his symptoms are class 1. He will continue his current meds. 3. VF arrest - that is was within the throes of his AMI.  4. Atrial fib - he is maintaining NSR on low dose amiodarone. I would consider stopping this after his cardiac MRI.  October.D.

## 2019-11-20 NOTE — Patient Instructions (Addendum)
Medication Instructions:  Your physician recommends that you continue on your current medications as directed. Please refer to the Current Medication list given to you today.  *If you need a refill on your cardiac medications before your next appointment, please call your pharmacy*  Lab Work: None ordered.  If you have labs (blood work) drawn today and your tests are completely normal, you will receive your results only by: Marland Kitchen MyChart Message (if you have MyChart) OR . A paper copy in the mail If you have any lab test that is abnormal or we need to change your treatment, we will call you to review the results.  Testing/Procedures: None ordered.  Follow-Up: At [facility], you and your health needs are our priority.  As part of our continuing mission to provide you with exceptional heart care, we have created designated Provider Care Teams.  These Care Teams include your primary Cardiologist (physician) and Advanced Practice Providers (APPs -  Physician Assistants and Nurse Practitioners) who all work together to provide you with the care you need, when you need it.  We recommend signing up for the patient portal called "MyChart".  Sign up information is provided on this After Visit Summary.  MyChart is used to connect with patients for Virtual Visits (Telemedicine).  Patients are able to view lab/test results, encounter notes, upcoming appointments, etc.  Non-urgent messages can be sent to your provider as well.   To learn more about what you can do with MyChart, go to [email or web address].    Your next appointment:   Your physician wants you to follow-up in: Based on results of cardiac MRI. We will call you with scheduling based on MRI results.  Other Instructions:  CT saff will call you to schedule. Your physician has requested that you have a cardiac MRI. Cardiac MRI uses a computer to create images of your heart as its beating, producing both still and moving pictures of your  heart and major blood vessels. For further information please visit [email or web address]. Please follow the instruction sheet given to you today for more information.

## 2019-11-27 ENCOUNTER — Encounter: Payer: Self-pay | Admitting: Internal Medicine

## 2019-11-27 ENCOUNTER — Telehealth: Payer: Self-pay | Admitting: *Deleted

## 2019-11-27 ENCOUNTER — Institutional Professional Consult (permissible substitution): Payer: Managed Care, Other (non HMO) | Admitting: Internal Medicine

## 2019-11-27 NOTE — Telephone Encounter (Signed)
Spoke with patient regarding appointment for Cardiac MRI scheduled Friday 01/11/20 at 8:00 am at [facility]---arrival time is 7:30 am---1st floor admissions office.  Will mail information to patient and it is also in My Chart-----patient voiced his understanding.

## 2019-11-27 NOTE — Addendum Note (Signed)
Addended by: Sampson Goon on: 11/27/2019 09:10 AM   Modules accepted: Orders

## 2020-01-02 ENCOUNTER — Other Ambulatory Visit (HOSPITAL_COMMUNITY): Payer: Managed Care, Other (non HMO)

## 2020-01-10 ENCOUNTER — Telehealth (HOSPITAL_COMMUNITY): Payer: Self-pay | Admitting: Emergency Medicine

## 2020-01-10 NOTE — Telephone Encounter (Signed)
Reaching out to patient to offer assistance regarding upcoming cardiac imaging study; pt verbalizes understanding of appt date/time, parking situation and where to check in, and verified current allergies; name and call back number provided for further questions should they arise °Edward Flater RN Navigator Cardiac Imaging °[facility] °[phone] office °[phone] cell ° °Denies claustro, denies implants °

## 2020-01-10 NOTE — Telephone Encounter (Signed)
Attempted to call patient regarding upcoming cardiac CT appointment. °Left message on voicemail with name and callback number °Andreyah Natividad RN Navigator Cardiac Imaging °[facility] °[phone] Office °[phone] Cell ° °

## 2020-01-11 ENCOUNTER — Ambulatory Visit (HOSPITAL_COMMUNITY)
Admission: RE | Admit: 2020-01-11 | Discharge: 2020-01-11 | Disposition: A | Discharge status: Home / Self Care | Payer: Managed Care, Other (non HMO) | Source: Ambulatory Visit | Attending: Internal Medicine | Admitting: Internal Medicine

## 2020-01-11 ENCOUNTER — Other Ambulatory Visit: Payer: Self-pay

## 2020-01-11 DIAGNOSIS — I5022 Chronic systolic (congestive) heart failure: Secondary | ICD-10-CM | POA: Diagnosis present

## 2020-01-11 DIAGNOSIS — I4901 Ventricular fibrillation: Secondary | ICD-10-CM

## 2020-01-11 DIAGNOSIS — I469 Cardiac arrest, cause unspecified: Secondary | ICD-10-CM

## 2020-01-11 DIAGNOSIS — I4892 Unspecified atrial flutter: Secondary | ICD-10-CM | POA: Diagnosis present

## 2020-01-11 HISTORY — PX: OTHER SURGICAL HISTORY: SHX169

## 2020-01-11 MED ORDER — GADOBUTROL 1 MMOL/ML IV SOLN
10.0000 mL | Freq: Once | INTRAVENOUS | Status: AC | PRN
  Administered 2020-01-11: 10 mL via INTRAVENOUS

## 2020-01-16 ENCOUNTER — Encounter (HOSPITAL_COMMUNITY): Payer: Self-pay

## 2020-02-28 NOTE — Progress Notes (Signed)
PCP: Cardiology: Dr [name] Primary HF Cardiologist: Dr Gala Romney   HPI: Mr Kutzer is a 49 year old with history of CAD, STEMI, VF arrest 07/11/19,and CVAs.   On 07/11/19 presented with acute STEMI complicated by witnessed VF. He had a total of 40-45 minutes of ACLS before ROSC  LHC showed 99% proximal LAD occlusion treated with PCI/DES to LAD. No other significant disease. He was started on DAPT with Aspirin and Brilinta. Brilinta was stopped and plavix started with the addition of Eliquis (for aflutter). TEE DC-CV 3/20 with conversion to NSR EF 30-35%. Discharged to home on 3/21/ 21.   Echo 6/21 EF 35-40%  cMRI 9/21 EF 39% RV normal. Mild to moderate MR  Today he returns for HF follow up. Feels great. Back to work. No CP or SOB. No bleeding with blood thinners.    LHC 07/11/19    Anterior STEMI complicated by VF arrest.    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  Otherwise minimal disease elsewhere.  Successful DES PCI of proximal LAD using resolute Onyx DES 3.5 x 18 mm postdilated to 4.0 mm.  Systolic hypotension with borderline cardiogenic shock with systolic pressures in the 90s over 60s requiring Levophed at 10 mcg/min. -->  Cardiac output-index 4.77-2.04; cardiac power 0.8.  No evidence of pulmonary hypertension  ROS: All systems negative except as listed in HPI, PMH and Problem List.  SH:  Social History   Socioeconomic History  . Marital status: Married    Spouse name: Not on file  . Number of children: Not on file  . Years of education: Not on file  . Highest education level: Not on file  Occupational History  . Occupation: Writer: [employer]  Tobacco Use  . Smoking status: Former Smoker    Packs/day: 1.00    Years: 20.00    Pack years: 20.00    Types: Cigarettes    Start date: 2000    Quit date: 07/11/2019    Years since quitting: 0.6  . Smokeless tobacco: Never Used  Substance and  Sexual Activity  . Alcohol use: Not Currently  . Drug use: Not Currently  . Sexual activity: Yes  Other Topics Concern  . Not on file  Social History Narrative  . Not on file   Social Determinants of Health   Financial Resource Strain:   . Difficulty of Paying Living Expenses: Not on file  Food Insecurity:   . Worried About Programme researcher, broadcasting/film/video in the Last Year: Not on file  . Ran Out of Food in the Last Year: Not on file  Transportation Needs: No Transportation Needs  . Lack of Transportation (Medical): No  . Lack of Transportation (Non-Medical): No  Physical Activity: Sufficiently Active  . Days of Exercise per Week: 7 days  . Minutes of Exercise per Session: 30 min  Stress:   . Feeling of Stress : Not on file  Social Connections:   . Frequency of Communication with Friends and Family: Not on file  . Frequency of Social Gatherings with Friends and Family: Not on file  . Attends Religious Services: Not on file  . Active Member of Clubs or Organizations: Not on file  . Attends Banker Meetings: Not on file  . Marital Status: Not on file  Intimate Partner Violence:   . Fear of Current or Ex-Partner: Not on file  . Emotionally Abused: Not on file  . Physically Abused: Not  on file  . Sexually Abused: Not on file    FH: No family history on file.  Past Medical History:  Diagnosis Date  . Acute ST elevation myocardial infarction (STEMI) involving left anterior descending coronary artery (HCC)    Presented as VF arrest -> 45 minutes CPR with ROSC showing anterior ST elevation: Cath revealed 95% thrombotic/hazy proximal-mid LAD -> DES PCI. ->  Severe Ischemic Cardiomyopathy (EF improved from 15% to 35-40%)  . Cardiac arrest with ventricular fibrillation (HCC) 07/11/2019  . History of multiple strokes 07/22/2019   Multiple CVAs noted on MRI post CARDIAC ARREST-prolonged CPR (innumerable punctate acute infarctions scattered throughout the cerebellum and s bilateral  cerebellar hemispheres most consistent with micro emboli) no residual symptoms.  . Hyperlipidemia with target LDL less than 70 07/22/2019  . Ischemic cardiomyopathy -> following anterior MI and cardiac arrest-initial EF 15% improved to 35-40% 3 months post PCI. 11/17/2019  . Paroxysmal atrial fibrillation/a atrial flutter ->  in setting of post MI/cardiac arrest 07/18/2019   Had PAF-flutter following rewarming from [facility] after cardiac arrest: TEE DCCV on 07/21/2019  . Smoker 07/13/2019    Current Outpatient Medications  Medication Sig Dispense Refill  . amiodarone (PACERONE) 100 MG tablet Take 1 tablet (100 mg total) by mouth daily. 90 tablet 3  . apixaban (ELIQUIS) 5 MG TABS tablet Take 1 tablet (5 mg total) by mouth 2 (two) times daily. 180 tablet 4  . atorvastatin (LIPITOR) 40 MG tablet Take 1 tablet (40 mg total) by mouth daily. 30 tablet 5  . carvedilol (COREG) 3.125 MG tablet Take 1 tablet (3.125 mg total) by mouth 2 (two) times daily. 60 tablet 5  . citalopram (CELEXA) 10 MG tablet Take 1 tablet (10 mg total) by mouth daily. 90 tablet 3  . clopidogrel (PLAVIX) 75 MG tablet Take 1 tablet (75 mg total) by mouth daily. 90 tablet 4  . dapagliflozin propanediol (FARXIGA) 10 MG TABS tablet Take 1 tablet (10 mg total) by mouth daily before breakfast. 30 tablet 5  . furosemide (LASIX) 20 MG tablet Take 1 tablet (20 mg total) by mouth daily as needed. Use as needed for lower leg swelling 30 tablet 4  . nitroGLYCERIN (NITROSTAT) 0.4 MG SL tablet Place 1 tablet (0.4 mg total) under the tongue every 5 (five) minutes as needed for chest pain. 25 tablet 6  . pantoprazole (PROTONIX) 40 MG tablet Take 1 tablet (40 mg total) by mouth daily. 30 tablet 5  . sacubitril-valsartan (ENTRESTO) 24-26 MG Take 1 tablet by mouth 2 (two) times daily. 60 tablet 5  . spironolactone (ALDACTONE) 25 MG tablet Take 1 tablet (25 mg total) by mouth daily. 30 tablet 6   No current facility-administered medications for this  encounter.    Vitals:   02/29/20 1037  BP: 105/73  Pulse: 68  SpO2: 99%  Weight: 101.3 kg (223 lb 6.4 oz)    PHYSICAL EXAM: General:  Well appearing. No resp difficulty HEENT: normal Neck: supple. no JVD. Carotids 2+ bilat; no bruits. No lymphadenopathy or thryomegaly appreciated. Cor: PMI nondisplaced. Regular rate & rhythm. No rubs, gallops or murmurs. Lungs: clear Abdomen: soft, nontender, nondistended. No hepatosplenomegaly. No bruits or masses. Good bowel sounds. Extremities: no cyanosis, clubbing, rash, edema Neuro: alert & orientedx3, cranial nerves grossly intact. moves all 4 extremities w/o difficulty. Affect pleasant  ASSESSMENT & PLAN:  1. CAD - LHC 3/21 showed 99% proximal LAD thrombotic occlusion, s/p PCI + DES placement. - No s/s angina - Continue  Plavix (1 year) and Eliquis.  - Continue atorvastatin 40 mg daily. 2. Chronic Systolic Heart Failure  -Echo 7/84/69 EF 15%. RV systolic function mildly reduced  - f/u echo on 3/13 LVEF 20-25%, TEE 07/21/19 EF 30-35% - Echo 6/21 EF 35-40% - cMRI 9/21 EF 39% RV normal. Mild to moderate MR - NYHA I. Volume status ok. Not using lasix - SBP runs 100-105. Occasional 90s. Will not titrate further - Continue carvedilol 3.125 mg twice a day  - Continue Entresto 24/26 bid - Continue spiro 12.5 mg daily.  - Continue Farxiga 10  3. H/O VF Arrest in the setting of MI.  - Plan as above.  4. Multiple CVAs as on MRI  - no residual  5. Atrial flutter, paroxysmal - occurred post-MI, no recurrence. - stop amio - Continue Eliquis. May consider stopping in future  Arvilla Meres, MD  10:53 AM

## 2020-02-29 ENCOUNTER — Other Ambulatory Visit: Payer: Self-pay

## 2020-02-29 ENCOUNTER — Ambulatory Visit (HOSPITAL_COMMUNITY)
Admission: RE | Admit: 2020-02-29 | Discharge: 2020-02-29 | Disposition: A | Discharge status: Home / Self Care | Payer: Managed Care, Other (non HMO) | Source: Ambulatory Visit | Attending: Internal Medicine | Admitting: Internal Medicine

## 2020-02-29 VITALS — BP 105/73 | HR 68 | Wt 223.4 lb

## 2020-02-29 DIAGNOSIS — I4892 Unspecified atrial flutter: Secondary | ICD-10-CM | POA: Diagnosis not present

## 2020-02-29 DIAGNOSIS — Z7901 Long term (current) use of anticoagulants: Secondary | ICD-10-CM | POA: Insufficient documentation

## 2020-02-29 DIAGNOSIS — I480 Paroxysmal atrial fibrillation: Secondary | ICD-10-CM | POA: Diagnosis not present

## 2020-02-29 DIAGNOSIS — I5022 Chronic systolic (congestive) heart failure: Secondary | ICD-10-CM | POA: Diagnosis not present

## 2020-02-29 DIAGNOSIS — I252 Old myocardial infarction: Secondary | ICD-10-CM | POA: Diagnosis not present

## 2020-02-29 DIAGNOSIS — Z955 Presence of coronary angioplasty implant and graft: Secondary | ICD-10-CM | POA: Diagnosis not present

## 2020-02-29 DIAGNOSIS — Z7902 Long term (current) use of antithrombotics/antiplatelets: Secondary | ICD-10-CM | POA: Diagnosis not present

## 2020-02-29 DIAGNOSIS — I2510 Atherosclerotic heart disease of native coronary artery without angina pectoris: Secondary | ICD-10-CM | POA: Diagnosis not present

## 2020-02-29 DIAGNOSIS — I4901 Ventricular fibrillation: Secondary | ICD-10-CM

## 2020-02-29 DIAGNOSIS — Z79899 Other long term (current) drug therapy: Secondary | ICD-10-CM | POA: Diagnosis not present

## 2020-02-29 DIAGNOSIS — Z87891 Personal history of nicotine dependence: Secondary | ICD-10-CM | POA: Diagnosis not present

## 2020-02-29 DIAGNOSIS — Z8674 Personal history of sudden cardiac arrest: Secondary | ICD-10-CM | POA: Insufficient documentation

## 2020-02-29 DIAGNOSIS — Z8673 Personal history of transient ischemic attack (TIA), and cerebral infarction without residual deficits: Secondary | ICD-10-CM | POA: Insufficient documentation

## 2020-02-29 NOTE — Addendum Note (Signed)
Encounter addended by: Suezanne Cheshire, RN on: 02/29/2020 11:04 AM  Actions taken: Order list changed, Medication long-term status modified, Clinical Note Signed

## 2020-02-29 NOTE — Patient Instructions (Signed)
Stop Amiodarone  Call office in March 2022 for follow up appointment.

## 2020-03-07 ENCOUNTER — Other Ambulatory Visit: Payer: Self-pay | Admitting: Cardiology

## 2020-03-07 ENCOUNTER — Other Ambulatory Visit (HOSPITAL_COMMUNITY): Payer: Self-pay | Admitting: Adult Health

## 2020-04-04 ENCOUNTER — Telehealth (HOSPITAL_COMMUNITY): Payer: Self-pay | Admitting: Cardiology

## 2020-04-04 ENCOUNTER — Other Ambulatory Visit (HOSPITAL_COMMUNITY): Payer: Self-pay | Admitting: Internal Medicine

## 2020-04-04 NOTE — Telephone Encounter (Signed)
Office received renewal order for life vest Patient report he returned equipment 02/2020  Leah with [employer] aware and order faxed with update that patient returned equipment   Message to provider as Lorain Childes

## 2020-05-07 ENCOUNTER — Other Ambulatory Visit: Payer: Self-pay | Admitting: Cardiology

## 2020-05-08 ENCOUNTER — Other Ambulatory Visit: Payer: Self-pay | Admitting: Cardiology

## 2020-05-15 ENCOUNTER — Telehealth: Payer: Self-pay | Admitting: Medical

## 2020-05-15 MED ORDER — APIXABAN 5 MG PO TABS: 5.0000 mg | ORAL_TABLET | Freq: Two times a day (BID) | ORAL | 0 refills | Status: DC

## 2020-05-15 NOTE — Telephone Encounter (Signed)
Will submit prior authorization for pt to continue on Eliquis. We do not have his prescription insurance on file, called [facility] who states he has not filled with them recently. Called [facility] and obtained info:  ID: [id number] BIN: [id number] GRP: [id number] PCN: [id number]  Prior authorization has been submitted. Will not likely hear back from insurance before he runs out of Eliquis on Friday, will forward to [facility] team to coordinate samples with pt for pickup tomorrow.

## 2020-05-15 NOTE — Telephone Encounter (Signed)
Spoke to patient .  Patient is aware that prior authorization is in process to continue with Eliquis.  patient is aware that samples are available for pick up at the office on Friday 05/16/20. AND SAVING CARD  patient is aware that if medication is denied and appeal is deneied.  Patient will be switched to  Xarelto 20 mg, which is covered by his insurance . Patient states he does not like change but verbalized understanding.

## 2020-05-15 NOTE — Telephone Encounter (Signed)
Spoke to patient .  Patient states pharmacy has  Sent  Information for prior authorization  For Eliquis. RN informed patient will contact pharmacy has not received information

## 2020-05-15 NOTE — Telephone Encounter (Signed)
Pt c/o medication issue:  1. Name of Medication: apixaban (ELIQUIS) 5 MG TABS tablet  2. How are you currently taking this medication (dosage and times per day)? 1 tablet by mouth two times daily   3. Are you having a reaction (difficulty breathing--STAT)? No   4. What is your medication issue? Aragon is calling stating he is needing prior authorization for his insurance to continue covering this medication. He states he has enough medication to last him until Saturday. Please advise.

## 2020-05-15 NOTE — Telephone Encounter (Signed)
Spoke to Pharmacy - [employer] is no longer covering Eliquis for this year  substitute is Xarelto or warfarin .   will discuss with Dr [name] and [facility] pharmacist and make a decision for change if needed

## 2020-05-19 NOTE — Telephone Encounter (Signed)
Prior authorization denied, appeals has been faxed.

## 2020-05-20 ENCOUNTER — Ambulatory Visit: Payer: Managed Care, Other (non HMO) | Admitting: Cardiology

## 2020-05-21 NOTE — Telephone Encounter (Signed)
Spoke with MD reviewing appeal.  Explained that patient has history of multiple strokes, history of NSTEMI, CAD, CHF with low EF, and A Fib.  MD took down information to send off to appeal department.

## 2020-05-21 NOTE — Telephone Encounter (Signed)
2nd appeals denied. External appeals submitted.

## 2020-05-23 NOTE — Telephone Encounter (Signed)
    Pt received a letter about his eliquis appeal denied. He would like to get a follow up for his prior auth

## 2020-05-23 NOTE — Telephone Encounter (Signed)
Called pt and advised him that we have submitted a prior authorization, level 1 and level 2 appeals and are waiting to hear back on his level 2 appeals still (submitted on 1/19). Pt aware he doesn't need to do anything with the denial letter he received. He will let us know if he needs samples of Eliquis if he runs out before his insurance has made a final determination.

## 2020-05-23 NOTE — Telephone Encounter (Signed)
Looks like you're working on this

## 2020-05-23 NOTE — Telephone Encounter (Signed)
Sent to pharmacy team.

## 2020-05-27 ENCOUNTER — Other Ambulatory Visit: Payer: Self-pay

## 2020-05-27 ENCOUNTER — Emergency Department (HOSPITAL_COMMUNITY)
Admission: EM | Admit: 2020-05-27 | Discharge: 2020-05-28 | Disposition: A | Discharge status: Home / Self Care | Payer: Managed Care, Other (non HMO) | Attending: Emergency Medicine | Admitting: Emergency Medicine

## 2020-05-27 ENCOUNTER — Encounter (HOSPITAL_COMMUNITY): Payer: Self-pay

## 2020-05-27 ENCOUNTER — Emergency Department (HOSPITAL_COMMUNITY): Payer: Managed Care, Other (non HMO)

## 2020-05-27 DIAGNOSIS — R002 Palpitations: Secondary | ICD-10-CM | POA: Diagnosis present

## 2020-05-27 DIAGNOSIS — Z7902 Long term (current) use of antithrombotics/antiplatelets: Secondary | ICD-10-CM | POA: Insufficient documentation

## 2020-05-27 DIAGNOSIS — Z87891 Personal history of nicotine dependence: Secondary | ICD-10-CM | POA: Diagnosis not present

## 2020-05-27 DIAGNOSIS — I2510 Atherosclerotic heart disease of native coronary artery without angina pectoris: Secondary | ICD-10-CM | POA: Insufficient documentation

## 2020-05-27 DIAGNOSIS — Z7901 Long term (current) use of anticoagulants: Secondary | ICD-10-CM | POA: Diagnosis not present

## 2020-05-27 LAB — CBC
HCT: 43.5 % (ref 39.0–52.0)
Hemoglobin: 14.2 g/dL (ref 13.0–17.0)
MCH: 31.1 pg (ref 26.0–34.0)
MCHC: 32.6 g/dL (ref 30.0–36.0)
MCV: 95.2 fL (ref 80.0–100.0)
Platelets: 262 10*3/uL (ref 150–400)
RBC: 4.57 MIL/uL (ref 4.22–5.81)
RDW: 11.8 % (ref 11.5–15.5)
WBC: 8.6 10*3/uL (ref 4.0–10.5)
nRBC: 0 % (ref 0.0–0.2)

## 2020-05-27 LAB — BASIC METABOLIC PANEL
Anion gap: 9 (ref 5–15)
BUN: 20 mg/dL (ref 6–20)
CO2: 24 mmol/L (ref 22–32)
Calcium: 9 mg/dL (ref 8.9–10.3)
Chloride: 106 mmol/L (ref 98–111)
Creatinine, Ser: 1.17 mg/dL (ref 0.61–1.24)
GFR, Estimated: >60 mL/min (ref >60)
Glucose, Bld: 103 mg/dL — ABNORMAL HIGH (ref 70–99)
Potassium: 3.6 mmol/L (ref 3.5–5.1)
Sodium: 139 mmol/L (ref 135–145)

## 2020-05-27 LAB — TROPONIN I (HIGH SENSITIVITY): Troponin I (High Sensitivity): 8 ng/L (ref <18)

## 2020-05-27 NOTE — ED Triage Notes (Signed)
Patient arrives from home with [facility], hx of MI with vfib arrest and ablation for a fib, reports around 1930 tonight he started having some palpitations, have since resolved, reports some anxiety associated.

## 2020-05-28 ENCOUNTER — Telehealth: Payer: Self-pay | Admitting: Cardiology

## 2020-05-28 LAB — TROPONIN I (HIGH SENSITIVITY): Troponin I (High Sensitivity): 9 ng/L (ref <18)

## 2020-05-28 NOTE — ED Provider Notes (Signed)
[facility] EMERGENCY DEPARTMENT Provider Note   CSN: [id number] Arrival date & time: 05/27/20  2116     History Chief Complaint  Patient presents with  . Palpitations    Edward Mendoza is a 50 y.o. male.  Patient is a 50 year old male with a history of STEMI and V. fib arrest for 45 minutes, hyperlipidemia and paroxysmal atrial fibrillation status post ablation all occurring last year who presents today with a short episode of palpitations.  Patient reports after he had received his treatment for the STEMI and atrial fibrillation and during cardiac rehab last year he had a short run of SVT.  He wore a vest for a prolonged period of time, did monitors and a cardiac MRI all which came back normal.  He had been doing well and that yesterday when he came out of his office he was sitting in the living room and had a brief episode of feeling like his heart was beating irregularly.  He denied any chest pain during the event or shortness of breath.  He did not feel near syncopal and reports that the episode lasted for seconds.  Because of his prior history his wife was concerned and they did call paramedics.  Paramedics encouraged patient to come to the emergency room to have further evaluation.  Unfortunately the patient has been waiting for 10 hours in the waiting room.  During that weight he has had no further episodes of palpitations or developed any chest pain or shortness of breath.  He has been taking all his medications as prescribed.  He does not use tobacco, drugs or alcohol.  He has otherwise been in his normal state of health.  The history is provided by the patient.  Palpitations Palpitations quality:  Fast Onset quality:  Sudden Timing:  Constant Progression:  Resolved Chronicity:  Recurrent Context: not anxiety, not blood loss, not bronchodilators, not caffeine, not dehydration, not exercise, not illicit drugs, not nicotine and not stimulant use   Relieved by:   None tried Worsened by:  Nothing Ineffective treatments:  None tried Associated symptoms: no chest pain, no chest pressure, no cough, no diaphoresis, no dizziness, no nausea, no near-syncope, no shortness of breath, no syncope, no vomiting and no weakness   Risk factors: heart disease and hx of atrial fibrillation        Past Medical History:  Diagnosis Date  . Acute ST elevation myocardial infarction (STEMI) involving left anterior descending coronary artery (HCC)    Presented as VF arrest -> 45 minutes CPR with ROSC showing anterior ST elevation: Cath revealed 95% thrombotic/hazy proximal-mid LAD -> DES PCI. ->  Severe Ischemic Cardiomyopathy (EF improved from 15% to 35-40%)  . Cardiac arrest with ventricular fibrillation (HCC) 07/11/2019  . History of multiple strokes 07/22/2019   Multiple CVAs noted on MRI post CARDIAC ARREST-prolonged CPR (innumerable punctate acute infarctions scattered throughout the cerebellum and s bilateral cerebellar hemispheres most consistent with micro emboli) no residual symptoms.  . Hyperlipidemia with target LDL less than 70 07/22/2019  . Ischemic cardiomyopathy -> following anterior MI and cardiac arrest-initial EF 15% improved to 35-40% 3 months post PCI. 11/17/2019  . Paroxysmal atrial fibrillation/a atrial flutter ->  in setting of post MI/cardiac arrest 07/18/2019   Had PAF-flutter following rewarming from [place] after cardiac arrest: TEE DCCV on 07/21/2019  . Smoker 07/13/2019    Patient Active Problem List   Diagnosis Date Noted  . Ischemic cardiomyopathy -> following anterior MI and cardiac  arrest-initial EF 15% improved to 35-40% 3 months post PCI. 11/17/2019  . Encephalopathy 07/22/2019  . History of multiple strokes 07/22/2019  . Respiratory failure (HCC) 07/22/2019  . Pneumonia 07/22/2019  . Shock liver 07/22/2019  . Depression 07/22/2019  . Cardiogenic shock (HCC) Initial E 15% 07/22/2019  . Hyperlipidemia with target LDL less than 70  07/22/2019  . Paroxysmal atrial fibrillation/a atrial flutter ->  in setting of post MI/cardiac arrest 07/18/2019  . VF (ventricular fibrillation) (HCC) 07/11/2019  . Cardiac arrest with ventricular fibrillation (HCC) 07/11/2019  . CAD S/P DES PCI of LAD 07/11/2019  . Acute ST elevation myocardial infarction (STEMI) involving left anterior descending coronary artery (HCC)   . Endotracheally intubated     Past Surgical History:  Procedure Laterality Date  . ARTERIAL LINE INSERTION N/A 07/11/2019   Procedure: ARTERIAL LINE INSERTION;  Surgeon: Marykay Lex, MD;  Location: [facility] INVASIVE CV LAB;  Service: Cardiovascular;  Laterality: N/A;  . CARDIOVERSION N/A 07/21/2019   Procedure: CARDIOVERSION;  Surgeon: Dolores Patty, MD;  Location: [facility] ENDOSCOPY;  Service: Cardiovascular;; TTE - DCCV: Atrial flutter-restored sinus rhythm  . CORONARY STENT INTERVENTION N/A 07/11/2019   Procedure: CORONARY STENT INTERVENTION;  Surgeon: Marykay Lex, MD;  Location: [facility] INVASIVE CV LAB;  Service: Cardiovascular;;  proximal-mid LAD 95% hazy lesion (DES PCI Resolute Onyx 3.5 mm 18 mm-at 4.0 mm),  . CORONARY/GRAFT ACUTE MI REVASCULARIZATION N/A 07/11/2019   Procedure: Coronary/Graft Acute MI Revascularization;  Surgeon: Marykay Lex, MD;  Location: [facility] INVASIVE CV LAB;  DES PCI of p-m LAD 95-99%.  Marland Kitchen RIGHT/LEFT HEART CATH AND CORONARY ANGIOGRAPHY N/A 07/11/2019   Procedure: RIGHT/LEFT HEART CATH AND CORONARY ANGIOGRAPHY;  Surgeon: Marykay Lex, MD;  Location: [facility] INVASIVE CV LAB:: Ant STEMI/Cardiac Arrest:  p-mLAD 95-99% thrombotic stenosis --> DES PCI. EF ~35%. Normal RHC Pressures. PCWP ~20 mmHg with LVEDP ~18 mmHg.  CO-CI 4.77 - 2.04 (no MCS placed)   . TEE WITHOUT CARDIOVERSION N/A 07/21/2019   Procedure: TRANSESOPHAGEAL ECHOCARDIOGRAM (TEE) W/ CARDIOVERSION;  Surgeon: Dolores Patty, MD;  Location: [facility] ENDOSCOPY;;  EF 30 to 35%.  Mid-apical anterior and apical hypo to akinesis.  No LAA. ->   Successful DCCV of Atrial Flutter.  . TRANSTHORACIC ECHOCARDIOGRAM  07/2019   a) Initial Post PCI-postarrest: EF~15%.  Severely reduced with global HK.  Moderate LVH.  Mildly dilated IVC.; b) 07/14/2019: EF 20 to 25%.  Entire anteroseptal, anterior and apical wall severe hypokinesis with moderate HK of mid-anterior inferior wall.  (Improved EF).  . TRANSTHORACIC ECHOCARDIOGRAM  10/24/2019   EF 35 to 40%.  Moderately dilated LV indeterminate filling pressures.  Akinetic mid anteroseptal wall as well as apical anterior and apical septal wall.  Otherwise normal valves and remaining chambers are normal.  History of PFO but no shunting.       History reviewed. No pertinent family history.  Social History   Tobacco Use  . Smoking status: Former Smoker    Packs/day: 1.00    Years: 20.00    Pack years: 20.00    Types: Cigarettes    Start date: 2000    Quit date: 07/11/2019    Years since quitting: 0.8  . Smokeless tobacco: Never Used  Substance Use Topics  . Alcohol use: Not Currently  . Drug use: Not Currently    Home Medications Prior to Admission medications   Medication Sig Start Date End Date Taking? Authorizing Provider  apixaban (ELIQUIS) 5 MG TABS tablet Take 1 tablet (  5 mg total) by mouth 2 (two) times daily. 05/15/20   Marykay Lex, MD  atorvastatin (LIPITOR) 40 MG tablet Take 1 tablet (40 mg total) by mouth daily. 07/22/19 07/21/20  Furth, Cadence H, PA-C  carvedilol (COREG) 3.125 MG tablet Take 1 tablet (3.125 mg total) by mouth 2 (two) times daily. 07/22/19 07/21/20  Furth, Cadence H, PA-C  citalopram (CELEXA) 10 MG tablet Take 1 tablet (10 mg total) by mouth daily. 11/19/19 11/18/20  Marykay Lex, MD  clopidogrel (PLAVIX) 75 MG tablet Take 1 tablet (75 mg total) by mouth daily. 07/23/19   Furth, Cadence H, PA-C  ENTRESTO 24-26 MG TAKE ONE TABLET BY MOUTH TWICE DAILY 03/07/20   Bensimhon, Bevelyn Buckles, MD  FARXIGA 10 MG TABS tablet TAKE ONE TABLET (10MG  TOTAL) BY MOUTH DAILY BEFORE  BREAKFAST 04/04/20   Bensimhon, Bevelyn Buckles, MD  furosemide (LASIX) 20 MG tablet Take 1 tablet (20 mg total) by mouth daily as needed. Use as needed for lower leg swelling 03/07/20   Marinus Maw, MD  nitroGLYCERIN (NITROSTAT) 0.4 MG SL tablet Place 1 tablet (0.4 mg total) under the tongue every 5 (five) minutes as needed for chest pain. 09/05/19   Marykay Lex, MD  pantoprazole (PROTONIX) 40 MG tablet Take 1 tablet (40 mg total) by mouth daily. 07/23/19   Furth, Cadence H, PA-C  spironolactone (ALDACTONE) 25 MG tablet Take 1 tablet (25 mg total) by mouth daily. 08/21/19   Tonye Becket D, NP    Allergies    Patient has no known allergies.  Review of Systems   Review of Systems  Constitutional: Negative for diaphoresis.  Respiratory: Negative for cough and shortness of breath.   Cardiovascular: Positive for palpitations. Negative for chest pain, syncope and near-syncope.  Gastrointestinal: Negative for nausea and vomiting.  Neurological: Negative for dizziness and weakness.  All other systems reviewed and are negative.   Physical Exam Updated Vital Signs BP 108/71 (BP Location: Right Arm)   Pulse 70   Temp 98 F (36.7 C) (Oral)   Resp 16   Ht 6\' 1"  (1.854 m)   Wt 102.1 kg   SpO2 100%   BMI 29.69 kg/m   Physical Exam Vitals and nursing note reviewed.  Constitutional:      General: He is not in acute distress.    Appearance: He is well-developed and well-nourished.  HENT:     Head: Normocephalic and atraumatic.     Mouth/Throat:     Mouth: Oropharynx is clear and moist.  Eyes:     Extraocular Movements: EOM normal.     Conjunctiva/sclera: Conjunctivae normal.     Pupils: Pupils are equal, round, and reactive to light.  Cardiovascular:     Rate and Rhythm: Normal rate and regular rhythm.     Pulses: Intact distal pulses.     Heart sounds: No murmur heard.   Pulmonary:     Effort: Pulmonary effort is normal. No respiratory distress.     Breath sounds: Normal breath  sounds. No wheezing or rales.  Abdominal:     General: There is no distension.     Palpations: Abdomen is soft.     Tenderness: There is no abdominal tenderness. There is no guarding or rebound.  Musculoskeletal:        General: No tenderness or edema. Normal range of motion.     Cervical back: Normal range of motion and neck supple.  Skin:    General: Skin is warm and dry.  Findings: No erythema or rash.  Neurological:     General: No focal deficit present.     Mental Status: He is alert and oriented to person, place, and time. Mental status is at baseline.  Psychiatric:        Mood and Affect: Mood and affect and mood normal.        Behavior: Behavior normal.        Thought Content: Thought content normal.      ED Results / Procedures / Treatments   Labs (all labs ordered are listed, but only abnormal results are displayed) Labs Reviewed  BASIC METABOLIC PANEL - Abnormal; Notable for the following components:      Result Value   Glucose, Bld 103 (*)    All other components within normal limits  CBC  TROPONIN I (HIGH SENSITIVITY)  TROPONIN I (HIGH SENSITIVITY)    EKG EKG Interpretation  Date/Time:  Wednesday May 28 2020 08:24:57 EST Ventricular Rate:  78 PR Interval:    QRS Duration: 101 QT Interval:  395 QTC Calculation: 450 R Axis:   71 Text Interpretation: Sinus rhythm Anteroseptal infarct, age indeterminate ST now normal in anterior leads compared to prior Reconfirmed by Gwyneth Sprout ([id number]) on 05/28/2020 8:43:18 AM   Radiology DG Chest 2 View  Result Date: 05/27/2020 CLINICAL DATA:  Chest pain EXAM: CHEST - 2 VIEW COMPARISON:  07/20/2019 FINDINGS: The heart size and mediastinal contours are within normal limits. Both lungs are clear. The visualized skeletal structures are unremarkable. IMPRESSION: No active cardiopulmonary disease. Electronically Signed   By: Burman Nieves M.D.   On: 05/27/2020 21:53    Procedures Procedures   Medications  Ordered in ED Medications - No data to display  ED Course  I have reviewed the triage vital signs and the nursing notes.  Pertinent labs & imaging results that were available during my care of the patient were reviewed by me and considered in my medical decision making (see chart for details).    MDM Rules/Calculators/A&P                          50 year old male presenting today with complaints of brief episode of palpitations approximately 12 hours ago.  Patient has had no further episodes and has now been waiting approximately 10 hours.  Patient is well-appearing.  Despite his significant cardiac history he appears to be doing well.  EKG today shows no evidence of dysrhythmia.  ST waves are normalizing compared to prior EKG 10 months ago.  Troponins are within normal limits, electrolytes without acute findings renal function normal.  Patient has not recently stopped taking any medications and denies excessive caffeine use.  Could have been brief PVCs versus SVT versus paroxysmal atrial fibrillation.  Patient does take Eliquis at this time.  Did encourage patient to follow-up with his cardiologist Dr. Sampson Goon and Dr. [name].  At this time do not feel that patient needs any further evaluation in the emergency room.  Did stress that he may need to have further monitoring done if he has further episodes but will defer to cardiology.  He has an appointment next week with Dr. [name] but will call the office later today to alert them.  MDM Number of Diagnoses or Management Options   Amount and/or Complexity of Data Reviewed Clinical lab tests: ordered and reviewed Tests in the radiology section of CPT: ordered and reviewed Tests in the medicine section of CPT: ordered and reviewed  Decide to obtain previous medical records or to obtain history from someone other than the patient: yes Obtain history from someone other than the patient: yes Review and summarize past medical records:  yes Discuss the patient with other providers: no Independent visualization of images, tracings, or specimens: yes  Risk of Complications, Morbidity, and/or Mortality Presenting problems: moderate Diagnostic procedures: minimal Management options: minimal  Patient Progress Patient progress: stable  Final Clinical Impression(s) / ED Diagnoses Final diagnoses:  Palpitations    Rx / DC Orders ED Discharge Orders    None       Gwyneth SproutPlunkett, Nekhi Liwanag, MD 05/28/20 [phone]

## 2020-05-28 NOTE — Discharge Instructions (Signed)
All the blood work looks good today.  Chestx-ray is normal and your EKG actually is looking more normal than the last one in April.  No signs of abnormal rhythm here but most likely had a short episode of SVT, afib or PVC's at home.  Continue current medication.  Talk with your cardiologist.  Nothing to change at this time.  If it happens again you may need further evaluation or whatever cardiology decides.

## 2020-05-28 NOTE — Telephone Encounter (Signed)
       I went in pt charge to see what his discharge from the ER said.

## 2020-06-02 ENCOUNTER — Encounter: Payer: Self-pay | Admitting: Cardiology

## 2020-06-02 ENCOUNTER — Telehealth: Payer: Self-pay | Admitting: *Deleted

## 2020-06-02 ENCOUNTER — Telehealth (INDEPENDENT_AMBULATORY_CARE_PROVIDER_SITE_OTHER): Payer: Managed Care, Other (non HMO) | Admitting: Cardiology

## 2020-06-02 VITALS — BP 110/75 | HR 75 | Ht 73.0 in | Wt 225.0 lb

## 2020-06-02 DIAGNOSIS — I2102 ST elevation (STEMI) myocardial infarction involving left anterior descending coronary artery: Secondary | ICD-10-CM | POA: Diagnosis not present

## 2020-06-02 DIAGNOSIS — I255 Ischemic cardiomyopathy: Secondary | ICD-10-CM | POA: Diagnosis not present

## 2020-06-02 DIAGNOSIS — I2510 Atherosclerotic heart disease of native coronary artery without angina pectoris: Secondary | ICD-10-CM

## 2020-06-02 DIAGNOSIS — I480 Paroxysmal atrial fibrillation: Secondary | ICD-10-CM

## 2020-06-02 DIAGNOSIS — E785 Hyperlipidemia, unspecified: Secondary | ICD-10-CM

## 2020-06-02 DIAGNOSIS — I5020 Unspecified systolic (congestive) heart failure: Secondary | ICD-10-CM

## 2020-06-02 DIAGNOSIS — I4901 Ventricular fibrillation: Secondary | ICD-10-CM

## 2020-06-02 DIAGNOSIS — Z9861 Coronary angioplasty status: Secondary | ICD-10-CM

## 2020-06-02 DIAGNOSIS — Z8673 Personal history of transient ischemic attack (TIA), and cerebral infarction without residual deficits: Secondary | ICD-10-CM

## 2020-06-02 DIAGNOSIS — I469 Cardiac arrest, cause unspecified: Secondary | ICD-10-CM

## 2020-06-02 MED ORDER — RIVAROXABAN 20 MG PO TABS: 20.0000 mg | ORAL_TABLET | Freq: Every day | ORAL | 3 refills | Status: DC

## 2020-06-02 NOTE — Assessment & Plan Note (Addendum)
EF now improved up to 39% by cardiac MRI.  As such, not an ICD candidate unless there are recurrent arrhythmias.  He does have a large area of anterior infarct.Marland Kitchen He is currently on an excellent regimen Assistance of Dr. Gala Romney and is seen.  He is on low-dose carvedilol along with Entresto and spironolactone.  He is not requiring any diuretic after the addition of [place].  Seen by EP, with EF > 36%, not an ICD candidate.

## 2020-06-02 NOTE — Assessment & Plan Note (Signed)
NYHA Class I symptoms with no exertional dyspnea, PND orthopnea.  Only gets short of breath with vigorous exertion despite having EF of 39% with clear diastolic dysfunction.  Is on stable dose of 2426 kg Entresto (twice daily), carvedilol 3.125 mg twice daily, spironolactone 25 mg daily, and [place].  Not requiring PRN Lasix.

## 2020-06-02 NOTE — Telephone Encounter (Signed)
  Patient Consent for Virtual Visit         Edward Mendoza has provided verbal consent on 06/02/2020 for a virtual visit (video or telephone).   CONSENT FOR VIRTUAL VISIT FOR:  Edward Mendoza  By participating in this virtual visit I agree to the following:  I hereby voluntarily request, consent and authorize [facility] and its employed or contracted physicians, physician assistants, nurse practitioners or other licensed health care professionals (the Practitioner), to provide me with telemedicine health care services (the "Services") as deemed necessary by the treating Practitioner. I acknowledge and consent to receive the Services by the Practitioner via telemedicine. I understand that the telemedicine visit will involve communicating with the Practitioner through live audiovisual communication technology and the disclosure of certain medical information by electronic transmission. I acknowledge that I have been given the opportunity to request an in-person assessment or other available alternative prior to the telemedicine visit and am voluntarily participating in the telemedicine visit.  I understand that I have the right to withhold or withdraw my consent to the use of telemedicine in the course of my care at any time, without affecting my right to future care or treatment, and that the Practitioner or I may terminate the telemedicine visit at any time. I understand that I have the right to inspect all information obtained and/or recorded in the course of the telemedicine visit and may receive copies of available information for a reasonable fee.  I understand that some of the potential risks of receiving the Services via telemedicine include:  Marland Kitchen Delay or interruption in medical evaluation due to technological equipment failure or disruption; . Information transmitted may not be sufficient (e.g. poor resolution of images) to allow for appropriate medical decision making by the Practitioner;  and/or  . In rare instances, security protocols could fail, causing a breach of personal health information.  Furthermore, I acknowledge that it is my responsibility to provide information about my medical history, conditions and care that is complete and accurate to the best of my ability. I acknowledge that Practitioner's advice, recommendations, and/or decision may be based on factors not within their control, such as incomplete or inaccurate data provided by me or distortions of diagnostic images or specimens that may result from electronic transmissions. I understand that the practice of medicine is not an exact science and that Practitioner makes no warranties or guarantees regarding treatment outcomes. I acknowledge that a copy of this consent can be made available to me via my patient portal [facility] MyChart), or I can request a printed copy by calling the office of [facility].    I understand that my insurance will be billed for this visit.   I have read or had this consent read to me. . I understand the contents of this consent, which adequately explains the benefits and risks of the Services being provided via telemedicine.  . I have been provided ample opportunity to ask questions regarding this consent and the Services and have had my questions answered to my satisfaction. . I give my informed consent for the services to be provided through the use of telemedicine in my medical care

## 2020-06-02 NOTE — Patient Instructions (Addendum)
Medication Instructions:    As discussed, we will switch to Xarelto 20 mg by mouth daily as replacement for Eliquis.  Since you are out of Eliquis, will stop Eliquis  Rx Xarelto 20 mg p.o. daily, dispense #90 tab, 3 refill  Otherwise no medication changes  *If you need a refill on your cardiac medications before your next appointment, please call your pharmacy*   Lab Work:   CMP, FLP, CBC   If you have labs (blood work) drawn today and your tests are completely normal, you will receive your results only by: Marland Kitchen MyChart Message (if you have MyChart) OR . A paper copy in the mail If you have any lab test that is abnormal or we need to change your treatment, we will call you to review the results.   Testing/Procedures: None   Follow-Up: At [facility], you and your health needs are our priority.  As part of our continuing mission to provide you with exceptional heart care, we have created designated Provider Care Teams.  These Care Teams include your primary Cardiologist (physician) and Advanced Practice Providers (APPs -  Physician Assistants and Nurse Practitioners) who all work together to provide you with the care you need, when you need it.    Your next appointment:   5 month(s) July 2022 We can cancel the upcoming appointment on next Monday, February 6.  The format for your next appointment:   In Person  Provider:   Bryan Lemma, MD   Other Instructions Keep staying active

## 2020-06-02 NOTE — Assessment & Plan Note (Signed)
Noted on brain MRI, likely in the setting of CPR.  He also had a brief spell of atrial flutter/fib in the hospital.  He has been maintained currently on Eliquis.  Due to insurance issues, we will convert to Xarelto, if not able to tolerate, we will have morbid impetus for prior authorization to obtain Eliquis.

## 2020-06-02 NOTE — Assessment & Plan Note (Addendum)
Labs showed most recent LDL level of 68.  Hopefully with continued diet and exercise, we will avoid higher doses of atorvastatin.  For now continue current dose of atorvastatin. In lieu of him coming in next week (which can be postponed), will have him come in for lipid panel, CMP, CBC and A1c.

## 2020-06-02 NOTE — Telephone Encounter (Signed)
RN spoke to patient. Instruction were given  from today's virtual visit 06/02/20 .  AVS SUMMARY has been sent by mychart . labslip mailed Recall placed for 5 month visit   Patient verbalized understanding

## 2020-06-02 NOTE — Progress Notes (Addendum)
Virtual Visit via Video Note   This visit type was conducted due to national recommendations for restrictions regarding the COVID-19 Pandemic (e.g. social distancing) in an effort to limit this patient's exposure and mitigate transmission in our community.  Due to his co-morbid illnesses, this patient is at least at moderate risk for complications without adequate follow up.  This format is felt to be most appropriate for this patient at this time.  All issues noted in this document were discussed and addressed.  A limited physical exam was performed with this format.  Please refer to the patient's chart for his consent to telehealth for [facility].  Patient has given verbal permission to conduct this visit via virtual appointment and to bill insurance 06/02/2020 12:31 PM     Evaluation Performed:  Follow-up visit  Date:  06/02/2020   ID:  Edward Mendoza, DOB 06-08-1970, MRN [id number]  Patient Location: Home Provider Location: Office/Clinic  PCP:  No primary care provider on file.  Cardiologist:  Bryan Lemmaavid Pinkney Venard, MD  Advanced Heart Failure: Arvilla Meresaniel Bensimhon, MD Electrophysiologist:  Lewayne BuntingGregg Taylor, MD   Chief Complaint:   Chief Complaint  Patient presents with  . Follow-up  . Coronary Artery Disease    No angina  . Cardiomyopathy    No CHF symptoms  . Atrial Flutter    Insurance coverage issues with Eliquis.  Marland Kitchen. Hospitalization Follow-up    ER follow-up-palpitations; no further symptoms   ==================================  ASSESSMENT & PLAN:     Problem List Items Addressed This Visit    ST elevation myocardial infarction (STEMI) involving left anterior descending (LAD) coronary artery in recovery phase (HCC) - Primary (Chronic)    Significant anterior STEMI with VF arrest, prolonged CPR with eventual ROSC complicated by cardiogenic shock.  PCI of the LAD performed following ROSC.  Now having NYHA-1 CHF symptoms of HFrEF.  Sizable anterior infarct noted on cardiac MRI with  no signs of viability.      Relevant Medications   rivaroxaban (XARELTO) 20 MG TABS tablet   Other Relevant Orders   Comprehensive metabolic panel   CBC   Ischemic cardiomyopathy -> following anterior MI and cardiac arrest-initial EF 15% improved to 35-40% 3 months post PCI. (Chronic)    EF now improved up to 39% by cardiac MRI.  As such, not an ICD candidate unless there are recurrent arrhythmias.  He does have a large area of anterior infarct.Marland Kitchen. He is currently on an excellent regimen Assistance of Dr. Gala RomneyBensimhon and is seen.  He is on low-dose carvedilol along with Entresto and spironolactone.  He is not requiring any diuretic after the addition of [place].  Seen by EP, with EF > 36%, not an ICD candidate.      Relevant Medications   rivaroxaban (XARELTO) 20 MG TABS tablet   Other Relevant Orders   Comprehensive metabolic panel   CBC   CAD S/P DES PCI of LAD (Chronic)    Successful DES PCI of the proximal and mid LAD with DES stent.  (3.5 mm stent postdilated to 4.0 mm).  No further angina.  He did have notable improvement in EF after revascularization and titration medications.  Plan:  Continue current dose of statin, beta-blocker, Entresto, and spironolactone.  He remains on clopidogrel without aspirin and the combination of Eliquis. -->  With significant LAD stent, would continue Plavix beyond the 1 year follow-up in March.    As of April 2022, okay to hold clopidogrel 5 days preop for surgeries or  procedures.       Relevant Medications   rivaroxaban (XARELTO) 20 MG TABS tablet   Other Relevant Orders   Lipid panel   Comprehensive metabolic panel   CBC   HFrEF (heart failure with reduced ejection fraction) (HCC)-NYHA Class I symptoms, EF 39% by CMR (Chronic)    NYHA Class I symptoms with no exertional dyspnea, PND orthopnea.  Only gets short of breath with vigorous exertion despite having EF of 39% with clear diastolic dysfunction.  Is on stable dose of 2426 kg Entresto  (twice daily), carvedilol 3.125 mg twice daily, spironolactone 25 mg daily, and [place].  Not requiring PRN Lasix.        Relevant Medications   rivaroxaban (XARELTO) 20 MG TABS tablet   Paroxysmal atrial fibrillation/a atrial flutter ->  in setting of post MI/cardiac arrest (Chronic)    He had atrial fibs/flutter during rewarming of Arctic sun correlating with TEE DCCV.  No longer on amiodarone as he has maintained sinus rhythm.  Remains on low-dose beta-blocker.  Currently asymptomatic.  He remains on amiodarone This patients CHA2DS2-VASc Score and unadjusted Ischemic Stroke Rate (% per year) is equal to 4.8 % stroke rate/year from a score of 4  Above score calculated as 1 point each if present [CHF, HTN, DM, Vascular=MI/PAD/Aortic Plaque, Age if 65-74, or Male] Above score calculated as 2 points each if present [Age > 75, or Stroke/TIA/TE]  => With his insurance company preferring Xarelto, we will switch to Xarelto 20 mg daily.  He is currently out of his Eliquis samples.  Continue to monitor to see if there are any signs or symptoms of breakthrough, but he did have a brief episode of flutter causing him to go to the hospital.  This could have just been PAT, but with his reduced EF and risk factors, is not unreasonable to continue 1 DOAC for now.       Relevant Medications   rivaroxaban (XARELTO) 20 MG TABS tablet   Hyperlipidemia with target LDL less than 70 (Chronic)    Labs showed most recent LDL level of 68.  Hopefully with continued diet and exercise, we will avoid higher doses of atorvastatin.  For now continue current dose of atorvastatin. In lieu of him coming in next week (which can be postponed), will have him come in for lipid panel, CMP, CBC and A1c.      Relevant Medications   rivaroxaban (XARELTO) 20 MG TABS tablet   Other Relevant Orders   Lipid panel   Cardiac arrest with ventricular fibrillation (HCC) (Chronic)    In the setting of anterior MI-his initial  presentation was chest pain followed by cardiac arrest.  EKG changes of STEMI were noted following ROSC after 45 minutes of CPR. Initial EF was 50% improved up to 39% by CMR.  No breakthrough episodes.  We will continue carvedilol.  No longer on amiodarone.      Relevant Medications   rivaroxaban (XARELTO) 20 MG TABS tablet   History of multiple strokes (Chronic)    Noted on brain MRI, likely in the setting of CPR.  He also had a brief spell of atrial flutter/fib in the hospital.  He has been maintained currently on Eliquis.  Due to insurance issues, we will convert to Xarelto, if not able to tolerate, we will have morbid impetus for prior authorization to obtain Eliquis.        History of Present Illness:    Edward Mendoza is a 50 y.o. male with  PMH notable for ISCHEMIC CARDIOMYOPATHY (EF~40%) with NYHA Class I HFrEF following Large Anterior STEMI complicated by VF Arrest and Cardiogenic Shock (March 2021 -45 minutes CPR with ROSC), CAD-DES PCI to ~95% mid LAD who presents via audio/video conferencing for a telehealth visit today as a 51-month follow-up. He has been followed by Dr. Gala Romney from Advanced Heart Failure, and has been evaluated by Dr. Sharrell Ku from EP   07/11/2019: Chest pain-called EMS, upon EMS arrival, was talking and collapsed -> found to be in V. tach/V. Fib; prolonged CPR with mechanical pump, King airway placed in the field -> after 40 to 45 minutes of CPR arrived at [facility] Emergency Room-> CPR continued, additional doses of epinephrine administered and King airway exchanged for ETT > shortly thereafter restored ROSC with identification of significant anterior ST elevations.  Patient was making potential purposeful movements fighting ETT, therefore transferred emergently to [facility] Cath Lab -> borderline cardiogenic shock, 6 total shocks ? 95% proximal-mid LAD --Successful DES PCI of proximal LAD using resolute Onyx DES 3.5 x 18 mm postdilated to 4.0 mm.  EF  by LV gram overestimated because of Levophed ? Initial echo with EF of 15% on 07/11/2019 improved to 20-25% as of 07/14/2019 and 30-35% on TEE on 07/21/2019 ? Never required mechanical support and pressors were weaned.  Arctic sun with rewarming. ? Paroxysmal atrial flutter/fib -> TEE DCCV on 07/21/2019 -> Eliquis added and converted from Brilinta to Plavix.  Maintained on amiodarone - no longer on Amio.  Echo October 24, 2019: EF 35 to 40%.  Moderately dilated LV indeterminate filling pressures.  Akinetic mid anteroseptal wall as well as apical anterior and apical septal wall.  Otherwise normal valves and remaining chambers are normal.  History of PFO but no shunting.  Cardiac MRI 01/11/2020: LVEF ~39%).  Severely dilated LV akinesis of the basal and mid inferoseptal, anteroseptal, mid and apical anterior, apical septal and inferior walls up to the apex.  No thrombus.  Late gadolinium enhancement (75 to 100% with no chance of recovery) and basal and mid inferoseptal, anteroseptal, mid and apical anterior walls, apical septal and inferior walls of true apex.  Mild to moderate MR.  Moderate LA dilation.  Normal RV size and function.  EF 68%.  Mildly dilated PA with mild  CLARANCE BOLLARD was last seen by me on November 14, 2019 -> he had lost quite a bit of weight and was starting to finally stabilized.  Had isolated memory loss-unable to remember having chest pain, CPR or anything up until waking up in the CCU.  Was notably anxious and nervous.  Thankfully, no recurrent cardiac symptoms. => Plan to discontinue amiodarone pending evaluation by Dr. Ladona Ridgel.  Otherwise no medication changes.   Consult with Dr. Ladona Ridgel (EP) November 20, 2019: Noted remarkable recovery, only some easy fatigue with vigorous exertion. => Recommended cardiac MRI to better assess EF.  Plan was ICD if EF  <35%, but not if >35%.  Recommended stopping amiodarone after cardiac MRI.   Most recent follow-up was with Dr. Gala Romney on February 29, 2020:  Noted feeling much better.-Feeling great.  Back to work.  No chest pain or pressure.  No bleeding.  NYHA-1 symptoms; unable to titrate medications further because of borderline blood pressures.  Consider the possibility of stopping Eliquis, amiodarone discontinued.  -> In the interim, there has been question about needing prior authorization for Eliquis-insurance company apparently covers Xarelto, but not Eliquis.  Patient has been reluctant to switch.  Has been covered with samples.  Hospitalizations:  . 05/27/20 - ER for "flutter" -> he noted a short episode of palpitations while sitting in the living room.  He felt the irregular heartbeat bit in retrospect he suspected lasted only a few seconds it may be admitted.  No chest pain or pressure no dyspnea.  Because the palpitation spells lasted longer than previously, he got concerned and his anxiety level increased.  He became quite hypertensive, but had no syncope or near syncope type symptoms.  EKG was normal, and essentially he was stabilized.  Unfortunately, they were not able to capture any arrhythmia.   Recent - Interim CV studies:   The following studies were reviewed today: . Cardiac MRI 01/11/2020: LVEF ~39%).  Severely dilated LV akinesis of the basal and mid inferoseptal, anteroseptal, mid and apical anterior, apical septal and inferior walls up to the apex.  No thrombus.  Late gadolinium enhancement (75 to 100% with no chance of recovery) and basal and mid inferoseptal, anteroseptal, mid and apical anterior walls, apical septal and inferior walls of true apex.  Mild to moderate MR.  Moderate LA dilation.  Normal RV size and function.  EF 68%.  Mildly dilated PA with mild:  Inerval History   EDSON DERIDDER is being seen today for what amounts to be his 108-month follow-up (is scheduled to be seen next week as in person visit) having been scheduled for hospital follow-up (without looking at the schedule seen that he was already scheduled for next  week).  Edward Mendoza says that since that episode a few days ago, he has not had any further palpitations, and that is probably be second time in the last 10 months that he is actually taking nitroglycerin because he was concerned of something.  Several months ago he took 2 doses of nitroglycerin with no effect for a strange sensation in his chest.  He really thinks that the episode on the 25th was probably more related to anxiety and almost panic attack.  Everything settled out now and he is doing fine he is trying to avoid triggers of palpitations such as caffeine and sugars.  Otherwise requested when he is relatively asymptomatic with no angina or CHF symptoms.  Unfortunately, he really does not remember his anginal pain, because the only time he had chest pain was prior to his cardiac arrest and he has amnesia from that event.  He denies any unusual sensations in his chest except that one brief episode.  Cardiovascular ROS: no chest pain or dyspnea on exertion positive for - palpitations, rapid heart rate and This was associated with elevated blood pressure.  Only one episode in the last 10 months.  Feels it is more related to anxiety/panic attack negative for - edema, irregular heartbeat, orthopnea, paroxysmal nocturnal dyspnea, shortness of breath or Syncope/near syncope or TIA/amaurosis fugax, claudication.  Melena, hematochezia, hematuria or epistaxis.  ROS:  Please see the history of present illness.    The patient does not have symptoms concerning for COVID-19 infection (fever, chills, cough, or new shortness of breath).  Review of Systems  Constitutional: Negative for malaise/fatigue and weight loss.  HENT: Negative for congestion and nosebleeds.   Respiratory: Negative for cough, shortness of breath and wheezing.   Cardiovascular: Positive for palpitations (Per HPI).  Gastrointestinal: Negative for blood in stool and melena.  Genitourinary: Negative for hematuria.  Musculoskeletal: Negative for  falls, joint pain and neck pain.       He notes is swelling along  one of the middle ribs on his right side of the chest.  It is somewhat equidistant between his right nipple and sternum.  It is not mobile and is firm.  Is not uncomfortable.  Neurological: Negative for dizziness and headaches.  Psychiatric/Behavioral: Negative for depression and memory loss. The patient is nervous/anxious (Per HPI). The patient does not have insomnia.     The patient is practicing social distancing.  Past Medical History:  Diagnosis Date  . Acute ST elevation myocardial infarction (STEMI) involving left anterior descending coronary artery (HCC)    Presented as VF arrest -> 45 minutes CPR with ROSC showing anterior ST elevation: Cath revealed 95% thrombotic/hazy proximal-mid LAD -> DES PCI. ->  Severe Ischemic Cardiomyopathy (EF improved from 15% to 35-40%)  . Cardiac arrest with ventricular fibrillation (HCC) 07/11/2019  . History of multiple strokes 07/22/2019   Multiple CVAs noted on MRI post CARDIAC ARREST-prolonged CPR (innumerable punctate acute infarctions scattered throughout the cerebellum and s bilateral cerebellar hemispheres most consistent with micro emboli) no residual symptoms.  . Hyperlipidemia with target LDL less than 70 07/22/2019  . Ischemic cardiomyopathy -> following anterior MI and cardiac arrest-initial EF 15% improved to 35-40% 3 months post PCI. 11/17/2019  . Paroxysmal atrial fibrillation/a atrial flutter ->  in setting of post MI/cardiac arrest 07/18/2019   Had PAF-flutter following rewarming from Arctic sun after cardiac arrest: TEE DCCV on 07/21/2019  . Smoker 07/13/2019   Past Surgical History:  Procedure Laterality Date  . ARTERIAL LINE INSERTION N/A 07/11/2019   Procedure: ARTERIAL LINE INSERTION;  Surgeon: Marykay Lex, MD;  Location: [facility] INVASIVE CV LAB;  Service: Cardiovascular;  Laterality: N/A;  . CARDIOVERSION N/A 07/21/2019   Procedure: CARDIOVERSION;  Surgeon: Dolores Patty, MD;  Location: [facility] ENDOSCOPY;  Service: Cardiovascular;; TTE - DCCV: Atrial flutter-restored sinus rhythm  . CORONARY STENT INTERVENTION N/A 07/11/2019   Procedure: CORONARY STENT INTERVENTION;  Surgeon: Marykay Lex, MD;  Location: [facility] INVASIVE CV LAB;  Service: Cardiovascular;;  proximal-mid LAD 95% hazy lesion (DES PCI Resolute Onyx 3.5 mm 18 mm-at 4.0 mm),  . CORONARY/GRAFT ACUTE MI REVASCULARIZATION N/A 07/11/2019   Procedure: Coronary/Graft Acute MI Revascularization;  Surgeon: Marykay Lex, MD;  Location: [facility] INVASIVE CV LAB;  DES PCI of p-m LAD 95-99%.  Marland Kitchen RIGHT/LEFT HEART CATH AND CORONARY ANGIOGRAPHY N/A 07/11/2019   Procedure: RIGHT/LEFT HEART CATH AND CORONARY ANGIOGRAPHY;  Surgeon: Marykay Lex, MD;  Location: [facility] INVASIVE CV LAB:: Ant STEMI/Cardiac Arrest:  p-mLAD 95-99% thrombotic stenosis --> DES PCI. EF ~35%. Normal RHC Pressures. PCWP ~20 mmHg with LVEDP ~18 mmHg.  CO-CI 4.77 - 2.04 (no MCS placed)   . TEE WITHOUT CARDIOVERSION N/A 07/21/2019   Procedure: TRANSESOPHAGEAL ECHOCARDIOGRAM (TEE) W/ CARDIOVERSION;  Surgeon: Dolores Patty, MD;  Location: [facility] ENDOSCOPY;;  EF 30 to 35%.  Mid-apical anterior and apical hypo to akinesis.  No LAA. ->  Successful DCCV of Atrial Flutter.  . TRANSTHORACIC ECHOCARDIOGRAM  07/2019   a) Initial Post PCI-postarrest: EF~15%.  Severely reduced with global HK.  Moderate LVH.  Mildly dilated IVC.; b) 07/14/2019: EF 20 to 25%.  Entire anteroseptal, anterior and apical wall severe hypokinesis with moderate HK of mid-anterior inferior wall.  (Improved EF).  . TRANSTHORACIC ECHOCARDIOGRAM  10/24/2019   EF 35 to 40%.  Moderately dilated LV indeterminate filling pressures.  Akinetic mid anteroseptal wall as well as apical anterior and apical septal wall.  Otherwise normal valves and remaining chambers are normal.  History  of PFO but no shunting.     Cardiac Cath 07/11/2019: Anterior STEMI with prolonged VF arrest -> proximal-mid LAD 95% hazy  lesion (DES PCI Resolute Onyx 3.5 mm 18 mm-at 4.0 mm), otherwise minimal disease.  EF estimated 35 to 45%-likely hyperdynamic on vasopressors.  Relatively normal right heart cath pressures _> PCWP 20 mmHg.  LVEDP 18 mmHg. Cardiac output-index by Fick: 4.77-2.04.       Current Meds  Medication Sig  . atorvastatin (LIPITOR) 40 MG tablet Take 1 tablet (40 mg total) by mouth daily.  . carvedilol (COREG) 3.125 MG tablet Take 1 tablet (3.125 mg total) by mouth 2 (two) times daily.  . citalopram (CELEXA) 10 MG tablet Take 1 tablet (10 mg total) by mouth daily.  . clopidogrel (PLAVIX) 75 MG tablet Take 1 tablet (75 mg total) by mouth daily.  Marland Kitchen ENTRESTO 24-26 MG TAKE ONE TABLET BY MOUTH TWICE DAILY  . FARXIGA 10 MG TABS tablet TAKE ONE TABLET (10MG  TOTAL) BY MOUTH DAILY BEFORE BREAKFAST  . furosemide (LASIX) 20 MG tablet Take 1 tablet (20 mg total) by mouth daily as needed. Use as needed for lower leg swelling  . nitroGLYCERIN (NITROSTAT) 0.4 MG SL tablet Place 1 tablet (0.4 mg total) under the tongue every 5 (five) minutes as needed for chest pain.  . pantoprazole (PROTONIX) 40 MG tablet Take 1 tablet (40 mg total) by mouth daily.  . rivaroxaban (XARELTO) 20 MG TABS tablet Take 1 tablet (20 mg total) by mouth daily with supper.  Marland Kitchen spironolactone (ALDACTONE) 25 MG tablet Take 1 tablet (25 mg total) by mouth daily.  . [DISCONTINUED] apixaban (ELIQUIS) 5 MG TABS tablet Take 1 tablet (5 mg total) by mouth 2 (two) times daily.     Allergies:   Patient has no known allergies.   Social History   Tobacco Use  . Smoking status: Former Smoker    Packs/day: 1.00    Years: 20.00    Pack years: 20.00    Types: Cigarettes    Start date: 2000    Quit date: 07/11/2019    Years since quitting: 0.8  . Smokeless tobacco: Never Used  Substance Use Topics  . Alcohol use: Not Currently  . Drug use: Not Currently   **Congratulated on his efforts.  Family Hx: The patient's Family history is unknown by  patient.  He does not know any of the details.   Labs/Other Tests and Data Reviewed:    EKG:  No ECG reviewed.  Recent Labs: 07/15/2019: Magnesium 2.4 10/24/2019: ALT 31; B Natriuretic Peptide 153.7; TSH 3.442 05/27/2020: BUN 20; Creatinine, Ser 1.17; Hemoglobin 14.2; Platelets 262; Potassium 3.6; Sodium 139   Recent Lipid Panel Lab Results  Component Value Date/Time   CHOL 135 10/09/2019 09:08 AM   TRIG 112 10/09/2019 09:08 AM   HDL 47 10/09/2019 09:08 AM   CHOLHDL 2.9 10/09/2019 09:08 AM   CHOLHDL 6.6 07/11/2019 03:07 AM   LDLCALC 68 10/09/2019 09:08 AM    Wt Readings from Last 3 Encounters:  06/02/20 225 lb (102.1 kg)  05/27/20 225 lb (102.1 kg)  02/29/20 223 lb 6.4 oz (101.3 kg)     Objective:    Vital Signs:  BP 110/75   Pulse 75   Ht 6\' 1"  (1.854 m)   Wt 225 lb (102.1 kg)   BMI 29.69 kg/m   VITAL SIGNS:  reviewed Well nourished, well developed male in no acute distress. A&O x 3.  Normal Mood & Affect Non-labored respirations   ==================================  COVID-19 Education: The signs and symptoms of COVID-19 were discussed with the patient and how to seek care for testing (follow up with PCP or arrange E-visit).   The importance of social distancing was discussed today.  Time:   Today, I have spent 22 minutes with the patient with telehealth technology discussing the above problems.  + 14 min charting   Medication Adjustments/Labs and Tests Ordered: Current medicines are reviewed at length with the patient today.  Concerns regarding medicines are outlined above.   Patient Instructions  Medication Instructions:    As discussed, we will switch to Xarelto 20 mg by mouth daily as replacement for Eliquis.  Since you are out of Eliquis, will stop Eliquis  Rx Xarelto 20 mg p.o. daily, dispense #90 tab, 3 refill  Otherwise no medication changes  *If you need a refill on your cardiac medications before your next appointment, please call your  pharmacy*   Lab Work:   CMP, FLP, CBC   If you have labs (blood work) drawn today and your tests are completely normal, you will receive your results only by: Marland Kitchen MyChart Message (if you have MyChart) OR . A paper copy in the mail If you have any lab test that is abnormal or we need to change your treatment, we will call you to review the results.   Testing/Procedures: None   Follow-Up: At [facility], you and your health needs are our priority.  As part of our continuing mission to provide you with exceptional heart care, we have created designated Provider Care Teams.  These Care Teams include your primary Cardiologist (physician) and Advanced Practice Providers (APPs -  Physician Assistants and Nurse Practitioners) who all work together to provide you with the care you need, when you need it.    Your next appointment:   5 month(s) July 2022 We can cancel the upcoming appointment on next Monday, February 6.  The format for your next appointment:   In Person  Provider:   Bryan Lemma, MD   Other Instructions Keep staying active     Signed, Bryan Lemma, MD  06/02/2020 12:31 PM    [facility]

## 2020-06-02 NOTE — Assessment & Plan Note (Signed)
He had atrial fibs/flutter during rewarming of Arctic sun correlating with TEE DCCV.  No longer on amiodarone as he has maintained sinus rhythm.  Remains on low-dose beta-blocker.  Currently asymptomatic.  He remains on amiodarone This patients CHA2DS2-VASc Score and unadjusted Ischemic Stroke Rate (% per year) is equal to 4.8 % stroke rate/year from a score of 4  Above score calculated as 1 point each if present [CHF, HTN, DM, Vascular=MI/PAD/Aortic Plaque, Age if 65-74, or Male] Above score calculated as 2 points each if present [Age > 75, or Stroke/TIA/TE]  => With his insurance company preferring Xarelto, we will switch to Xarelto 20 mg daily.  He is currently out of his Eliquis samples.  Continue to monitor to see if there are any signs or symptoms of breakthrough, but he did have a brief episode of flutter causing him to go to the hospital.  This could have just been PAT, but with his reduced EF and risk factors, is not unreasonable to continue 1 DOAC for now.

## 2020-06-02 NOTE — Assessment & Plan Note (Signed)
Significant anterior STEMI with VF arrest, prolonged CPR with eventual ROSC complicated by cardiogenic shock.  PCI of the LAD performed following ROSC.  Now having NYHA-1 CHF symptoms of HFrEF.  Sizable anterior infarct noted on cardiac MRI with no signs of viability.

## 2020-06-02 NOTE — Assessment & Plan Note (Signed)
In the setting of anterior MI-his initial presentation was chest pain followed by cardiac arrest.  EKG changes of STEMI were noted following ROSC after 45 minutes of CPR. Initial EF was 50% improved up to 39% by CMR.  No breakthrough episodes.  We will continue carvedilol.  No longer on amiodarone.

## 2020-06-02 NOTE — Assessment & Plan Note (Signed)
Successful DES PCI of the proximal and mid LAD with DES stent.  (3.5 mm stent postdilated to 4.0 mm).  No further angina.  He did have notable improvement in EF after revascularization and titration medications.  Plan:  Continue current dose of statin, beta-blocker, Entresto, and spironolactone.  He remains on clopidogrel without aspirin and the combination of Eliquis. -->  With significant LAD stent, would continue Plavix beyond the 1 year follow-up in March.    As of April 2022, okay to hold clopidogrel 5 days preop for surgeries or procedures.

## 2020-06-06 ENCOUNTER — Other Ambulatory Visit (HOSPITAL_COMMUNITY): Payer: Self-pay | Admitting: Internal Medicine

## 2020-06-09 ENCOUNTER — Ambulatory Visit: Payer: Managed Care, Other (non HMO) | Admitting: Cardiology

## 2020-06-20 ENCOUNTER — Other Ambulatory Visit: Payer: Self-pay | Admitting: Cardiology

## 2020-06-20 ENCOUNTER — Encounter (HOSPITAL_COMMUNITY): Payer: Self-pay

## 2020-06-23 MED ORDER — APIXABAN 5 MG PO TABS: 5.0000 mg | ORAL_TABLET | Freq: Two times a day (BID) | ORAL | 5 refills | Status: DC

## 2020-06-23 NOTE — Telephone Encounter (Signed)
I spoke with patient. Let him know that we received a fax on Friday around 5:30 that Elqiuis was approved. He is currently taking Xarelto, but would like to switch back. He states he does have copay card for Eliquis. Rx sent to [facility] drug per request.

## 2020-07-04 ENCOUNTER — Other Ambulatory Visit: Payer: Self-pay | Admitting: Cardiology

## 2020-08-06 ENCOUNTER — Other Ambulatory Visit: Payer: Self-pay | Admitting: Cardiology

## 2020-08-07 ENCOUNTER — Other Ambulatory Visit: Payer: Self-pay

## 2020-08-07 ENCOUNTER — Encounter (HOSPITAL_COMMUNITY): Payer: Self-pay | Admitting: Internal Medicine

## 2020-08-07 ENCOUNTER — Ambulatory Visit (HOSPITAL_COMMUNITY)
Admission: RE | Admit: 2020-08-07 | Discharge: 2020-08-07 | Disposition: A | Discharge status: Home / Self Care | Payer: Managed Care, Other (non HMO) | Source: Ambulatory Visit | Attending: Internal Medicine | Admitting: Internal Medicine

## 2020-08-07 ENCOUNTER — Other Ambulatory Visit (HOSPITAL_COMMUNITY): Payer: Self-pay | Admitting: Internal Medicine

## 2020-08-07 VITALS — BP 104/70 | HR 68 | Wt 235.4 lb

## 2020-08-07 DIAGNOSIS — Z8674 Personal history of sudden cardiac arrest: Secondary | ICD-10-CM | POA: Insufficient documentation

## 2020-08-07 DIAGNOSIS — Z7984 Long term (current) use of oral hypoglycemic drugs: Secondary | ICD-10-CM | POA: Insufficient documentation

## 2020-08-07 DIAGNOSIS — I480 Paroxysmal atrial fibrillation: Secondary | ICD-10-CM

## 2020-08-07 DIAGNOSIS — Z87891 Personal history of nicotine dependence: Secondary | ICD-10-CM | POA: Diagnosis not present

## 2020-08-07 DIAGNOSIS — Z955 Presence of coronary angioplasty implant and graft: Secondary | ICD-10-CM | POA: Diagnosis not present

## 2020-08-07 DIAGNOSIS — Z8673 Personal history of transient ischemic attack (TIA), and cerebral infarction without residual deficits: Secondary | ICD-10-CM | POA: Diagnosis not present

## 2020-08-07 DIAGNOSIS — Z7901 Long term (current) use of anticoagulants: Secondary | ICD-10-CM | POA: Insufficient documentation

## 2020-08-07 DIAGNOSIS — I5022 Chronic systolic (congestive) heart failure: Secondary | ICD-10-CM | POA: Diagnosis not present

## 2020-08-07 DIAGNOSIS — I252 Old myocardial infarction: Secondary | ICD-10-CM | POA: Diagnosis not present

## 2020-08-07 DIAGNOSIS — Z79899 Other long term (current) drug therapy: Secondary | ICD-10-CM | POA: Diagnosis not present

## 2020-08-07 DIAGNOSIS — I5020 Unspecified systolic (congestive) heart failure: Secondary | ICD-10-CM | POA: Diagnosis not present

## 2020-08-07 DIAGNOSIS — R002 Palpitations: Secondary | ICD-10-CM

## 2020-08-07 DIAGNOSIS — I4892 Unspecified atrial flutter: Secondary | ICD-10-CM | POA: Insufficient documentation

## 2020-08-07 DIAGNOSIS — I2510 Atherosclerotic heart disease of native coronary artery without angina pectoris: Secondary | ICD-10-CM | POA: Diagnosis not present

## 2020-08-07 DIAGNOSIS — Z7902 Long term (current) use of antithrombotics/antiplatelets: Secondary | ICD-10-CM | POA: Insufficient documentation

## 2020-08-07 HISTORY — DX: Heart failure, unspecified: I50.9

## 2020-08-07 LAB — CBC
HCT: 46.7 % (ref 39.0–52.0)
Hemoglobin: 15.4 g/dL (ref 13.0–17.0)
MCH: 30.9 pg (ref 26.0–34.0)
MCHC: 33 g/dL (ref 30.0–36.0)
MCV: 93.6 fL (ref 80.0–100.0)
Platelets: 275 10*3/uL (ref 150–400)
RBC: 4.99 MIL/uL (ref 4.22–5.81)
RDW: 11.9 % (ref 11.5–15.5)
WBC: 6.6 10*3/uL (ref 4.0–10.5)
nRBC: 0 % (ref 0.0–0.2)

## 2020-08-07 LAB — BASIC METABOLIC PANEL
Anion gap: 7 (ref 5–15)
BUN: 16 mg/dL (ref 6–20)
CO2: 24 mmol/L (ref 22–32)
Calcium: 9.3 mg/dL (ref 8.9–10.3)
Chloride: 105 mmol/L (ref 98–111)
Creatinine, Ser: 1.05 mg/dL (ref 0.61–1.24)
GFR, Estimated: >60 mL/min (ref >60)
Glucose, Bld: 94 mg/dL (ref 70–99)
Potassium: 4.1 mmol/L (ref 3.5–5.1)
Sodium: 136 mmol/L (ref 135–145)

## 2020-08-07 LAB — BRAIN NATRIURETIC PEPTIDE: B Natriuretic Peptide: 53.4 pg/mL (ref 0.0–100.0)

## 2020-08-07 NOTE — Patient Instructions (Signed)
Labs done today, your results will be available in MyChart, we will contact you for abnormal readings.  Your physician has requested that you have an echocardiogram. Echocardiography is a painless test that uses sound waves to create images of your heart. It provides your doctor with information about the size and shape of your heart and how well your heart's chambers and valves are working. This procedure takes approximately one hour. There are no restrictions for this procedure.  Your provider has recommended that  you wear a Zio Patch for 14 days.  This monitor will record your heart rhythm for our review.  IF you have any symptoms while wearing the monitor please press the button.  If you have any issues with the patch or you notice a red or orange light on it please call the company at [phone].  Once you remove the patch please mail it back to the company as soon as possible so we can get the results.  Please call our office in December to schedule your follow up appointment  If you have any questions or concerns before your next appointment please send [place] a message through [place] or call our office at [phone].    TO LEAVE A MESSAGE FOR THE NURSE SELECT OPTION 2, PLEASE LEAVE A MESSAGE INCLUDING: . YOUR NAME . DATE OF BIRTH . CALL BACK NUMBER . REASON FOR CALL**this is important as we prioritize the call backs  YOU WILL RECEIVE A CALL BACK THE SAME DAY AS LONG AS YOU CALL BEFORE 4:00 PM  At the [facility], you and your health needs are our priority. As part of our continuing mission to provide you with exceptional heart care, we have created designated Provider Care Teams. These Care Teams include your primary Cardiologist (physician) and Advanced Practice Providers (APPs- Physician Assistants and Nurse Practitioners) who all work together to provide you with the care you need, when you need it.   You may see any of the following providers on your designated  Care Team at your next follow up: Marland Kitchen Dr Arvilla Meres . Dr Marca Ancona . Dr Thornell Mule . Tonye Becket, NP . Robbie Lis, PA . Shanda Bumps [place],NP . Karle Plumber, PharmD   Please be sure to bring in all your medications bottles to every appointment.

## 2020-08-07 NOTE — Progress Notes (Signed)
PCP: Cardiology: Dr [name] Primary HF Cardiologist: Dr Gala Romney   HPI: [name] is a 50 year old with history of CAD, STEMI, VF arrest 07/11/19,and CVAs.   On 07/11/19 presented with acute STEMI complicated by witnessed VF. He had a total of 40-45 minutes of ACLS before ROSC  LHC showed 99% proximal LAD occlusion treated with PCI/DES to LAD. No other significant disease. He was started on DAPT with Aspirin and Brilinta. Brilinta was stopped and plavix started with the addition of Eliquis (for aflutter). TEE DC-CV 3/20 with conversion to NSR EF 30-35%. Discharged to home on 3/21/ 21.   Echo 6/21 EF 35-40%  cMRI 9/21 EF 39% RV normal. Mild to moderate MR  Today he returns for HF follow up. Feels good. Back to work. Has been feeling fine. Had a brief palpitation and resolved. Otherwise denies CP or SOB. Walks 5-7 miles per day at work with [employer]. No edema, orthopnea or PND. No bleeding with apixaban.   LHC 07/11/19    Anterior STEMI complicated by VF arrest.    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  Otherwise minimal disease elsewhere.  Successful DES PCI of proximal LAD using resolute Onyx DES 3.5 x 18 mm postdilated to 4.0 mm.  Systolic hypotension with borderline cardiogenic shock with systolic pressures in the 90s over 60s requiring Levophed at 10 mcg/min. -->  Cardiac output-index 4.77-2.04; cardiac power 0.8.  No evidence of pulmonary hypertension  ROS: All systems negative except as listed in HPI, PMH and Problem List.  SH:  Social History   Socioeconomic History  . Marital status: Married    Spouse name: Not on file  . Number of children: Not on file  . Years of education: Not on file  . Highest education level: Not on file  Occupational History  . Occupation: Writer: [employer]  Tobacco Use  . Smoking status: Former Smoker    Packs/day: 1.00    Years: 20.00    Pack years: 20.00    Types:  Cigarettes    Start date: 2000    Quit date: 07/11/2019    Years since quitting: 1.0  . Smokeless tobacco: Never Used  Substance and Sexual Activity  . Alcohol use: Not Currently  . Drug use: Not Currently  . Sexual activity: Yes  Other Topics Concern  . Not on file  Social History Narrative  . Not on file   Social Determinants of Health   Financial Resource Strain: Not on file  Food Insecurity: Not on file  Transportation Needs: Not on file  Physical Activity: Not on file  Stress: Not on file  Social Connections: Not on file  Intimate Partner Violence: Not on file    FH:  Family History  Family history unknown: Yes    Past Medical History:  Diagnosis Date  . Acute ST elevation myocardial infarction (STEMI) involving left anterior descending coronary artery (HCC)    Presented as VF arrest -> 45 minutes CPR with ROSC showing anterior ST elevation: Cath revealed 95% thrombotic/hazy proximal-mid LAD -> DES PCI. ->  Severe Ischemic Cardiomyopathy (EF improved from 15% to 35-40%)  . Cardiac arrest with ventricular fibrillation (HCC) 07/11/2019  . CHF (congestive heart failure) (HCC)   . History of multiple strokes 07/22/2019   Multiple CVAs noted on MRI post CARDIAC ARREST-prolonged CPR (innumerable punctate acute infarctions scattered throughout the cerebellum and s bilateral cerebellar hemispheres most consistent with micro emboli) no residual  symptoms.  . Hyperlipidemia with target LDL less than 70 07/22/2019  . Ischemic cardiomyopathy -> following anterior MI and cardiac arrest-initial EF 15% improved to 35-40% 3 months post PCI. 11/17/2019  . Paroxysmal atrial fibrillation/a atrial flutter ->  in setting of post MI/cardiac arrest 07/18/2019   Had PAF-flutter following rewarming from [place] after cardiac arrest: TEE DCCV on 07/21/2019  . Smoker 07/13/2019    Current Outpatient Medications  Medication Sig Dispense Refill  . apixaban (ELIQUIS) 5 MG TABS tablet Take 1 tablet  (5 mg total) by mouth 2 (two) times daily. 60 tablet 5  . atorvastatin (LIPITOR) 40 MG tablet Take 1 tablet (40 mg total) by mouth daily. 30 tablet 11  . carvedilol (COREG) 3.125 MG tablet Take 1 tablet (3.125 mg total) by mouth 2 (two) times daily. 60 tablet 11  . citalopram (CELEXA) 10 MG tablet Take 1 tablet (10 mg total) by mouth daily. 90 tablet 3  . clopidogrel (PLAVIX) 75 MG tablet Take 1 tablet (75 mg total) by mouth daily. 90 tablet 3  . ENTRESTO 24-26 MG TAKE ONE TABLET BY MOUTH TWICE DAILY 180 tablet 0  . FARXIGA 10 MG TABS tablet TAKE ONE TABLET (10MG  TOTAL) BY MOUTH DAILY BEFORE BREAKFAST 90 tablet 3  . furosemide (LASIX) 20 MG tablet Take 1 tablet (20 mg total) by mouth daily as needed. Use as needed for lower leg swelling 90 tablet 3  . nitroGLYCERIN (NITROSTAT) 0.4 MG SL tablet Place 1 tablet (0.4 mg total) under the tongue every 5 (five) minutes as needed for chest pain. 25 tablet 6  . pantoprazole (PROTONIX) 40 MG tablet Take 1 tablet (40 mg total) by mouth daily. 30 tablet 11  . spironolactone (ALDACTONE) 25 MG tablet Take 1 tablet (25 mg total) by mouth daily. 30 tablet 11   No current facility-administered medications for this encounter.    Vitals:   08/07/20 1045  BP: 104/70  Pulse: 68  SpO2: 98%  Weight: 106.8 kg (235 lb 6.4 oz)    PHYSICAL EXAM: General:  Well appearing. No resp difficulty HEENT: normal Neck: supple. no JVD. Carotids 2+ bilat; no bruits. No lymphadenopathy or thryomegaly appreciated. Cor: PMI nondisplaced. Regular rate & rhythm. No rubs, gallops or murmurs. Lungs: clear Abdomen: soft, nontender, nondistended. No hepatosplenomegaly. No bruits or masses. Good bowel sounds. Extremities: no cyanosis, clubbing, rash, edema Neuro: alert & orientedx3, cranial nerves grossly intact. moves all 4 extremities w/o difficulty. Affect pleasant   ASSESSMENT & PLAN:  1. CAD - LHC 3/21 showed 99% proximal LAD thrombotic occlusion, s/p PCI + DES  placement. - No s/s angina - Continue Plavix (1 year) and Eliquis.  - Continue atorvastatin 40 mg daily.  2. Chronic Systolic Heart Failure  -Echo 4/21 EF 15%. RV systolic function mildly reduced  - f/u echo on 3/13 LVEF 20-25%, TEE 07/21/19 EF 30-35% - Echo 6/21 EF 35-40% - cMRI 9/21 EF 39% RV normal. Mild to moderate MR - NYHA I  - Volume status ok. Not on lasi  - SBP runs 100-110 at home. - Continue carvedilol 3.125 mg twice a day  - Continue Entresto 24/26 bid - Continue spiro 25 mg daily.  - Continue Farxiga 10  - EF > 35% on MRI so no ICD - Repeat echo. Labs today  3. H/O VF Arrest in the setting of MI.  - Plan as above.   4. Multiple CVAs as on MRI  - no residual  5. Atrial flutter, paroxysmal - occurred  post-MI, no recurrence  - Continue Eliquis. May consider stopping in future - Had recent palpitations Will place Zio for 14 days. If no AF can stop Eliquis - We also discussed AliveCor monitoring to follow after [place] Crane, MD  11:31 AM

## 2020-08-07 NOTE — Addendum Note (Signed)
Encounter addended by: Dolores Patty, MD on: 08/07/2020 1:02 PM  Actions taken: Level of Service modified, Visit diagnoses modified

## 2020-08-11 NOTE — Addendum Note (Signed)
Encounter addended by: Noralee Space, RN on: 08/11/2020 10:27 AM  Actions taken: Order list changed, Diagnosis association updated

## 2020-08-18 ENCOUNTER — Other Ambulatory Visit (HOSPITAL_COMMUNITY): Payer: Self-pay | Admitting: Surgery

## 2020-08-31 HISTORY — PX: TRANSTHORACIC ECHOCARDIOGRAM: SHX275

## 2020-09-05 ENCOUNTER — Other Ambulatory Visit (HOSPITAL_COMMUNITY): Payer: Self-pay | Admitting: Internal Medicine

## 2020-09-10 ENCOUNTER — Other Ambulatory Visit: Payer: Self-pay | Admitting: Cardiology

## 2020-09-18 ENCOUNTER — Ambulatory Visit (INDEPENDENT_AMBULATORY_CARE_PROVIDER_SITE_OTHER): Payer: Managed Care, Other (non HMO)

## 2020-09-18 ENCOUNTER — Other Ambulatory Visit: Payer: Self-pay

## 2020-09-18 DIAGNOSIS — I5020 Unspecified systolic (congestive) heart failure: Secondary | ICD-10-CM

## 2020-09-18 LAB — ECHOCARDIOGRAM COMPLETE
Area-P 1/2: 4.83 cm2
Calc EF: 42.2 %
S' Lateral: 5 cm
Single Plane A2C EF: 45.9 %
Single Plane A4C EF: 34.6 %

## 2020-09-18 MED ORDER — PERFLUTREN LIPID MICROSPHERE
1.0000 mL | INTRAVENOUS | Status: AC | PRN
  Administered 2020-09-18: 5 mL via INTRAVENOUS

## 2020-09-18 NOTE — Progress Notes (Signed)
Complete echocardiogram W/ CONTRAST performed.  Jimmy Verne Lanuza RDCS, RVT

## 2020-09-22 ENCOUNTER — Encounter (HOSPITAL_COMMUNITY): Payer: Self-pay

## 2020-11-05 ENCOUNTER — Other Ambulatory Visit: Payer: Self-pay | Admitting: Cardiology

## 2020-12-04 ENCOUNTER — Other Ambulatory Visit (HOSPITAL_COMMUNITY): Payer: Self-pay | Admitting: Internal Medicine

## 2020-12-05 ENCOUNTER — Other Ambulatory Visit: Payer: Self-pay

## 2020-12-05 MED ORDER — NITROGLYCERIN 0.4 MG SL SUBL: SUBLINGUAL_TABLET | SUBLINGUAL | 2 refills | Status: DC

## 2021-03-05 ENCOUNTER — Other Ambulatory Visit (HOSPITAL_COMMUNITY): Payer: Self-pay | Admitting: Internal Medicine

## 2021-03-20 ENCOUNTER — Other Ambulatory Visit (HOSPITAL_COMMUNITY): Payer: Self-pay | Admitting: Internal Medicine

## 2021-03-23 ENCOUNTER — Telehealth: Payer: Self-pay | Admitting: Cardiology

## 2021-03-23 NOTE — Telephone Encounter (Signed)
Received patient call into triage-  Patient states that yesterday evening he began to have a heat sensation in his chest area, he became sweating, lightheaded. He ate and sat down and felt better. Then he woke up this morning at 1 AM and had the same sensation, did the same thing and it helped. He then stated while at work about an hour ago the same symptoms occurred. He states he sweat through this clothes, but ate and had come caffeine drink and felt better 30 minutes later. He states he feels fine now, but just wanted to bring this to someone attention. Patient states he has never had issues with his blood sugar before but I did advise he could mention having this checked by his PCP, but also advised of when to seek ED care for chest pains- due to cardiac history. Patient states his BP was 134/85 and HR 85. He will contact his PCP about some other concerns, but wanted to bring this to Dr.Harding's attention as well. Advised I would call back if any other recommendations.  Patient verbalized understanding.

## 2021-03-23 NOTE — Telephone Encounter (Signed)
Pt c/o of Chest Pain: STAT if CP now or developed within 24 hours  1. Are you having CP right now? No   2. Are you experiencing any other symptoms (ex. SOB, nausea, vomiting, sweating)? Severe sweating, lightheadedness (felt like passing out)  3. How long have you been experiencing CP? Yesterday evening  4. Is your CP continuous or coming and going? Coming and going (more like a discomfort; like a hot sensation inside chest)  5. Have you taken Nitroglycerin? No  ?

## 2021-03-23 NOTE — Telephone Encounter (Signed)
Very unusual symptoms.  I have not seen Edward Mendoza in person in well over a year.  I do agree with checking with his PCP.  The fact these are resting symptoms makes it very unlikely that is a ACS issue if he is feeling better now.  It is possible that he had an arrhythmia which would potentially be something to be evaluated.   Bryan Lemma, MD

## 2021-03-24 NOTE — Telephone Encounter (Signed)
Rn spoke to patient . Informed Dr [name] recommendation to follow up with primary. Patient voiced understanding.

## 2021-04-06 ENCOUNTER — Other Ambulatory Visit: Payer: Self-pay

## 2021-06-03 ENCOUNTER — Other Ambulatory Visit: Payer: Self-pay

## 2021-06-03 MED ORDER — NITROGLYCERIN 0.4 MG SL SUBL: SUBLINGUAL_TABLET | SUBLINGUAL | 2 refills | Status: DC

## 2021-07-03 ENCOUNTER — Other Ambulatory Visit: Payer: Self-pay

## 2021-07-03 MED ORDER — SPIRONOLACTONE 25 MG PO TABS: 25.0000 mg | ORAL_TABLET | Freq: Every day | ORAL | 0 refills | Status: DC

## 2021-07-03 MED ORDER — ATORVASTATIN CALCIUM 40 MG PO TABS: 40.0000 mg | ORAL_TABLET | Freq: Every day | ORAL | 0 refills | Status: DC

## 2021-07-03 MED ORDER — CARVEDILOL 3.125 MG PO TABS: 3.1250 mg | ORAL_TABLET | Freq: Two times a day (BID) | ORAL | 0 refills | Status: DC

## 2021-07-03 MED ORDER — PANTOPRAZOLE SODIUM 40 MG PO TBEC: 40.0000 mg | DELAYED_RELEASE_TABLET | Freq: Every day | ORAL | 0 refills | Status: DC

## 2021-07-17 ENCOUNTER — Other Ambulatory Visit: Payer: Self-pay

## 2021-07-17 ENCOUNTER — Ambulatory Visit: Payer: Managed Care, Other (non HMO) | Admitting: Cardiology

## 2021-07-17 ENCOUNTER — Encounter: Payer: Self-pay | Admitting: Cardiology

## 2021-07-17 VITALS — BP 118/76 | HR 66 | Ht 72.0 in | Wt 240.8 lb

## 2021-07-17 DIAGNOSIS — I2510 Atherosclerotic heart disease of native coronary artery without angina pectoris: Secondary | ICD-10-CM | POA: Diagnosis not present

## 2021-07-17 DIAGNOSIS — I255 Ischemic cardiomyopathy: Secondary | ICD-10-CM

## 2021-07-17 DIAGNOSIS — I480 Paroxysmal atrial fibrillation: Secondary | ICD-10-CM

## 2021-07-17 DIAGNOSIS — Z9861 Coronary angioplasty status: Secondary | ICD-10-CM

## 2021-07-17 DIAGNOSIS — I469 Cardiac arrest, cause unspecified: Secondary | ICD-10-CM | POA: Diagnosis not present

## 2021-07-17 DIAGNOSIS — I4901 Ventricular fibrillation: Secondary | ICD-10-CM

## 2021-07-17 DIAGNOSIS — I2102 ST elevation (STEMI) myocardial infarction involving left anterior descending coronary artery: Secondary | ICD-10-CM

## 2021-07-17 DIAGNOSIS — I5020 Unspecified systolic (congestive) heart failure: Secondary | ICD-10-CM

## 2021-07-17 DIAGNOSIS — E785 Hyperlipidemia, unspecified: Secondary | ICD-10-CM | POA: Diagnosis not present

## 2021-07-17 NOTE — Progress Notes (Deleted)
? ?  1. CAD ?- LHC 3/21 showed 99% proximal LAD thrombotic occlusion, s/p PCI + DES placement. ?- No s/s angina ?- Continue Plavix (1 year) and Eliquis.  ?- Continue atorvastatin 40 mg daily. ?  ?2. Chronic Systolic Heart Failure  ?-Echo 07/11/19 EF 15%. RV systolic function mildly reduced  ?- f/u echo on 3/13 LVEF 20-25%, TEE 07/21/19 EF 30-35% ?- Echo 6/21 EF 35-40% ?- cMRI 9/21 EF 39% RV normal. Mild to moderate MR ?- NYHA I  ?- Volume status ok. Not on lasi  ?- SBP runs 100-110 at home. ?- Continue carvedilol 3.125 mg twice a day  ?- Continue Entresto 24/26 bid ?- Continue spiro 25 mg daily.  ?- Continue Farxiga 10  ?- EF > 35% on MRI so no ICD ?- Repeat echo. Labs today ?  ?3. H/O VF Arrest in the setting of MI.  ?- Plan as above.  ?  ?4. Multiple CVAs as on MRI  ?- no residual ?  ?5. Atrial flutter, paroxysmal ?- occurred post-MI, no recurrence  ?- Continue Eliquis. May consider stopping in future ?- Had recent palpitations Will place Zio for 14 days. If no AF can stop Eliquis ?- We also discussed AliveCor monitoring to follow after Zio  ?  ?Glori Bickers, MD  ?11:31 AM ?

## 2021-07-17 NOTE — Patient Instructions (Signed)
Medication Instructions:  ?Your physician recommends that you continue on your current medications as directed. Please refer to the Current Medication list given to you today. ? ?*If you need a refill on your cardiac medications before your next appointment, please call your pharmacy* ? ? ?Lab Work: ?Please return for FASTING labs next week (CMET,Lipid) ? ?Our in office lab hours are Monday-Friday 8:00-4:00, closed for lunch 12:45-1:45 pm.  No appointment needed. ? ?Follow-Up: ?At [facility], you and your health needs are our priority.  As part of our continuing mission to provide you with exceptional heart care, we have created designated Provider Care Teams.  These Care Teams include your primary Cardiologist (physician) and Advanced Practice Providers (APPs -  Physician Assistants and Nurse Practitioners) who all work together to provide you with the care you need, when you need it. ? ?We recommend signing up for the patient portal called "MyChart".  Sign up information is provided on this After Visit Summary.  MyChart is used to connect with patients for Virtual Visits (Telemedicine).  Patients are able to view lab/test results, encounter notes, upcoming appointments, etc.  Non-urgent messages can be sent to your provider as well.   ?To learn more about what you can do with MyChart, go to [email or web address].   ? ?Your next appointment:   ?November ? ?The format for your next appointment:   ?In Person ? ?Provider:   ?Glenetta Hew, MD   ? ? ? ?

## 2021-07-17 NOTE — Progress Notes (Signed)
? ? ?Primary Care Provider: Mateo Flow, MD ?Cardiologist: Glenetta Hew, MD ?Advanced CHF Cardiologist: Glori Bickers, MD ?Electrophysiologist: None ? ?Clinic Note: ?Chief Complaint  ?Patient presents with  ? Follow-up  ?  Annual-doing well.  No issues.  ? Coronary Artery Disease  ?  No angina  ? Cardiomyopathy  ?  Class I symptoms.  Tolerating meds.  ? Atrial Flutter  ?  Has not had any symptoms  ? ?=================================== ? ?ASSESSMENT/PLAN  ? ?Problem List Items Addressed This Visit   ? ?  ? Cardiology Problems  ? ST elevation myocardial infarction (STEMI) involving left anterior descending (LAD) coronary artery in recovery phase (HCC) (Chronic)  ?  2 years out from his massive anterior MI and prolonged cardiac arrest.  Doing remarkably well.  Despite having EF of 35 to 40%, he has bounced fully back into his life.  Exercising routinely but no angina or heart failure symptoms.  Tolerating medications. ?  ?  ? Ischemic cardiomyopathy -> following anterior MI and cardiac arrest-initial EF 15% improved to 35-40% 3 months post PCI. (Chronic)  ?  EF of 35 to 40% confirmed by cardiac MRI.  Therefore not a candidate for ICD with no recurrent arrhythmias. ?He does have a large area of anterior infarct, but relatively stable EKG without bundle branch block. ? ?He is tolerating low doses of carvedilol, Entresto along with 25 mg spironolactone.  Since the addition of Farxiga, only on PRN diuretic. ?  ?  ? CAD S/P DES PCI of LAD - Primary (Chronic)  ?  Single-vessel CAD noted with proximal to mid LAD lesion treated with a DES stent.  He did have improvement in his EF after revascularization and medication titration.  Because of the need for DOAC, he was converted from Brilinta to Plavix.  Now remains on Plavix plus DOAC. ? ?I suspect we can probably stop Eliquis since he has not any breakthrough spells of A-fib in the last 2 years. ? ?Plan: On stable dose of beta-blocker, Entresto and statin. ?Would likely  continue long-term monotherapy with Plavix.   ?Plavix can be held 5 to 7 days preop for surgeries or procedures plus colonoscopies etc. ?  ?  ? Relevant Orders  ? EKG 12-Lead  ? Comprehensive metabolic panel  ? Lipid panel  ? HFrEF (heart failure with reduced ejection fraction) (HCC)-NYHA Class I symptoms, EF 39% by CMR (Chronic)  ?  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  No PND orthopnea edema.  Euvolemic on exam.  He has PRN Lasix which she has may be used 2 or 3 times in the last 6 months. ? ?Plan: ?Continue current dose of carvedilol 3.25 mg twice daily along with Entresto 24 to 26 mg twice daily, spironolactone 25 mg daily and dapagliflozin 10 mg daily. ?PRN Lasix ?  ?  ? Paroxysmal atrial fibrillation/a atrial flutter ->  in setting of post MI/cardiac arrest (Chronic)  ?  Does not appear to have had any recurrent breakthrough episodes since his cardioversion in setting of his MI.  However, there is evidence of multiple embolic strokes noted on MRI and therefore it still is pretty high CHA2DS2-VASc score of 4. ? ?He is on low-dose carvedilol for rate control with no breakthrough spells.  Remains on Eliquis still.  I agree with Dr. Haroldine Laws that he could probably stop the Eliquis.  He is due to see Dr. Haroldine Laws soon at the end of this month.  If he does appear to be stable, would  probably recommend stopping DOAC ?Remains on Plavix which would continue therapy. ?  ?  ? Hyperlipidemia with target LDL less than 70 (Chronic)  ?  Has not had labs checked in a while.  Need to reassess.  Back in June 2021 LDL was 68.  He remains on stable dose of atorvastatin. ? ?Check follow-up labs. ?  ?  ? Relevant Orders  ? Comprehensive metabolic panel  ? Lipid panel  ? Cardiac arrest with ventricular fibrillation (HCC) (Chronic)  ?  Again 3 years out from ischemic cardiac arrest with ventricular fibrillation and 45 minutes of CPR before ROSC. ?Remarkably, full neurologic and physical recovery with resultant ischemic  cardiomyopathy but only class I CHF symptoms.  Multiple embolic strokes noted on MRI, but no residual effect. ? ?Remains on low-dose beta-blocker but with no further episodes, not on any other antiarrhythmic. ?  ?  ? ? ?=================================== ? ?HPI:   ? ?Edward Mendoza is a 51 y.o. male with a history of Anterior STEMI with cardiac arrest and prolonged CPR with resultant Ischemic Cardiomyopathy after LAD PCI (reviewed below) who presents today for essentially annual follow-up with me.. ? ?CARDIAC HISTORY ?07/11/19 presented with Acute Anterior STEMI complicated by witnessed VF = 40-45 min ACLS before ROSC  ?LHC - 99% prox LAD => DES PCI; Echo EF ~15%, Global HK; CVP 15 mmHg; Borderline C-Shock -- on low dose Levophed.  ?Echo 3/13: EF 20-25%, Severe HK of Entire Anterioseptal/anterior/ apical wall. Mod HK of mid-apical Inferior wall. ?07/21/19: Post PCI Atrial Flutter => TEE DCCV; converted to Plavix/Eliquis ?EF improved to 30-35% post DCCV. ?Event monitor - no recurrent Aflutter/Fib.  ?Echo 10/24/19:  EF 35-40% - Mid-Apical Anteroseptal / Apical Akinesis. Normal RAP. ?CMRI 01/11/20:  LVEF ~39%. Akinesis of basal-mid inferoseptal, anteroseptal & apical anterior-septal-inferior walls as well as tru apex.  Paradoxical septal WM. 75-100% late enhancement - FIXED INFARCT. Normal RV function. Mod LA dilation. Mild-mod MR & TR.  ?Echo 09/18/2020: EF ~30-35% (~ slightly worse than 10/2019). ?14 D Zio: 3 atrial runs 5-9 beats. Rare PACs & PVCs.  Symptomatic PVCs.  ? ?I last saw Micah Flesher.  Telemedicine on June 02, 2020 (as a post hospital follow-up for palpitations and hypertension).  He was doing remarkably well.  He had a couple episodes of palpitations but nothing significant.  Maybe twice in 10 months.  He had taken a total of 2 doses of nitroglycerin for some strange sensation in his chest over the last several months.  Probably more related to anxiety.  He notes that he does not recall his MI/cardiac  arrest.  He has no concept of what his angina was. ?No medication changes made with exception of temporarily converting from Eliquis to Xarelto. ?  ?He was then seen by Dr. Haroldine Laws on August 07, 2020:HF follow up. Feels good. Back to work. Has been feeling fine. Had a brief palpitation and resolved. Otherwise denies CP or SOB. Walks 5-7 miles per day at work with [employer]. No edema, orthopnea or PND. No bleeding with apixaban (had switch back to apixaban).  Due to EF of 35 to 40% on MRI, not sent for ICD.  No suggestive of recurrent atrial fibrillation/flutter- ?Recheck 2D echo along with Zio patch monitor. ?Suggestion that he would potentially stop Eliquis if there is no evidence of atrial fib or flutter. => Follow-up after monitor with results suggested that documents [place] recommended stopping Eliquis. ? ?Recent Hospitalizations: None ? ?Reviewed  CV studies:   ? ?The  following studies were reviewed today: (if available, images/films reviewed: From Epic Chart or Care Everywhere) ?Echo 09/18/2020: EF ~30-35% (~ slightly worse than 10/2019). ?14 D Zio: 3 atrial runs 5-9 beats. Rare PACs & PVCs.  Symptomatic PVCs. : ? ? ?Interval History:  ? ?Micah Flesher returns here today for follow-up stating that he is doing remarkably well.  He only 8 months of activity gained weight after quitting smoking has not been to lose it back.  He still walks about 5 to 7 miles despite every day and really only notes getting dyspnea if he walks uphill for a long time or if he has to climb up several steps of stairs carrying something.  He has not had any chest tightness or pressure with rest exertion.  No PND, orthopnea or edema. ?Rare fluttering sensations. ?He does have some mild leg aching sensations that get worse when he is has prolonged standing.  Not necessarily walking. ? ?CV Review of Symptoms (Summary) ?Cardiovascular ROS: positive for - dyspnea on exertion, palpitations, and -these episodes are mild as noted.  Resting leg  discomfort.  No exacerbation with exercise. ?negative for - chest pain, edema, orthopnea, paroxysmal nocturnal dyspnea, rapid heart rate, shortness of breath, or lightheadedness, dizziness or wooziness, syncope/

## 2021-07-19 ENCOUNTER — Encounter: Payer: Self-pay | Admitting: Cardiology

## 2021-07-19 NOTE — Assessment & Plan Note (Signed)
EF of 35 to 40% confirmed by cardiac MRI.  Therefore not a candidate for ICD with no recurrent arrhythmias. ?He does have a large area of anterior infarct, but relatively stable EKG without bundle branch block. ? ?He is tolerating low doses of carvedilol, Entresto along with 25 mg spironolactone.  Since the addition of Farxiga, only on PRN diuretic. ?

## 2021-07-19 NOTE — Assessment & Plan Note (Signed)
Single-vessel CAD noted with proximal to mid LAD lesion treated with a DES stent.  He did have improvement in his EF after revascularization and medication titration.  Because of the need for DOAC, he was converted from Brilinta to Plavix.  Now remains on Plavix plus DOAC. ? ?I suspect we can probably stop Eliquis since he has not any breakthrough spells of A-fib in the last 2 years. ? ?Plan: On stable dose of beta-blocker, Entresto and statin. ?? Would likely continue long-term monotherapy with Plavix.   ?? Plavix can be held 5 to 7 days preop for surgeries or procedures plus colonoscopies etc. ?

## 2021-07-19 NOTE — Assessment & Plan Note (Signed)
Again 3 years out from ischemic cardiac arrest with ventricular fibrillation and 45 minutes of CPR before ROSC. ?Remarkably, full neurologic and physical recovery with resultant ischemic cardiomyopathy but only class I CHF symptoms.  Multiple embolic strokes noted on MRI, but no residual effect. ? ?Remains on low-dose beta-blocker but with no further episodes, not on any other antiarrhythmic. ?

## 2021-07-19 NOTE — Assessment & Plan Note (Signed)
Does not appear to have had any recurrent breakthrough episodes since his cardioversion in setting of his MI.  However, there is evidence of multiple embolic strokes noted on MRI and therefore it still is pretty high CHA2DS2-VASc score of 4. ? ?He is on low-dose carvedilol for rate control with no breakthrough spells.  Remains on Eliquis still.  I agree with Dr. Gala Romney that he could probably stop the Eliquis.  He is due to see Dr. Gala Romney soon at the end of this month.  If he does appear to be stable, would probably recommend stopping DOAC ?Remains on Plavix which would continue therapy. ?

## 2021-07-19 NOTE — Assessment & Plan Note (Signed)
2 years out from his massive anterior MI and prolonged cardiac arrest.  Doing remarkably well.  Despite having EF of 35 to 40%, he has bounced fully back into his life.  Exercising routinely but no angina or heart failure symptoms.  Tolerating medications. ?

## 2021-07-19 NOTE — Assessment & Plan Note (Signed)
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  No PND orthopnea edema.  Euvolemic on exam.  He has PRN Lasix which she has may be used 2 or 3 times in the last 6 months. ? ?Plan: ?? Continue current dose of carvedilol 3.25 mg twice daily along with Entresto 24 to 26 mg twice daily, spironolactone 25 mg daily and dapagliflozin 10 mg daily. ?? PRN Lasix ?

## 2021-07-19 NOTE — Assessment & Plan Note (Signed)
Has not had labs checked in a while.  Need to reassess.  Back in June 2021 LDL was 68.  He remains on stable dose of atorvastatin. ? ?Check follow-up labs. ?

## 2021-07-21 LAB — COMPREHENSIVE METABOLIC PANEL
ALT: 23 IU/L (ref 0–44)
AST: 19 IU/L (ref 0–40)
Albumin/Globulin Ratio: 2 (ref 1.2–2.2)
Albumin: 4.5 g/dL (ref 4.0–5.0)
Alkaline Phosphatase: 108 IU/L (ref 44–121)
BUN/Creatinine Ratio: 15 (ref 9–20)
BUN: 17 mg/dL (ref 6–24)
Bilirubin Total: 0.8 mg/dL (ref 0.0–1.2)
CO2: 23 mmol/L (ref 20–29)
Calcium: 9.5 mg/dL (ref 8.7–10.2)
Chloride: 102 mmol/L (ref 96–106)
Creatinine, Ser: 1.13 mg/dL (ref 0.76–1.27)
Globulin, Total: 2.3 g/dL (ref 1.5–4.5)
Glucose: 87 mg/dL (ref 70–99)
Potassium: 4.4 mmol/L (ref 3.5–5.2)
Sodium: 138 mmol/L (ref 134–144)
Total Protein: 6.8 g/dL (ref 6.0–8.5)
eGFR: 80 mL/min/{1.73_m2} (ref >59)

## 2021-07-21 LAB — LIPID PANEL
Chol/HDL Ratio: 3.4 ratio (ref 0.0–5.0)
Cholesterol, Total: 162 mg/dL (ref 100–199)
HDL: 48 mg/dL (ref >39)
LDL Chol Calc (NIH): 93 mg/dL (ref 0–99)
Triglycerides: 115 mg/dL (ref 0–149)
VLDL Cholesterol Cal: 21 mg/dL (ref 5–40)

## 2021-07-24 ENCOUNTER — Telehealth: Payer: Self-pay | Admitting: *Deleted

## 2021-07-24 DIAGNOSIS — E785 Hyperlipidemia, unspecified: Secondary | ICD-10-CM

## 2021-07-24 DIAGNOSIS — I2510 Atherosclerotic heart disease of native coronary artery without angina pectoris: Secondary | ICD-10-CM

## 2021-07-24 MED ORDER — EZETIMIBE 10 MG PO TABS: 10.0000 mg | ORAL_TABLET | Freq: Every day | ORAL | 3 refills | Status: DC

## 2021-07-24 MED ORDER — ROSUVASTATIN CALCIUM 40 MG PO TABS: 40.0000 mg | ORAL_TABLET | Freq: Every day | ORAL | 3 refills | Status: DC

## 2021-07-24 NOTE — Telephone Encounter (Signed)
Spoke to patient .  Patient is aware to complete the atorvastatin and then start Rosuvastatin 40 mg along with Zetia 10 mg daily recheck labs 4 months after the starting of new medication .  ?Patient voiced understanding. ?mailed labslips  ?

## 2021-07-24 NOTE — Telephone Encounter (Signed)
-----   Message from Leonie Man, MD sent at 07/21/2021  9:26 PM EDT ----- ?Chemistry panel shows normal kidney and liver function.  Normal electrolytes. ? ?Unfortunately, cholesterol levels seem to have gotten worse.  Total cholesterol is now it is 162 when it had been 135.  LDL (bad cholesterol is now above target.  It was 68 and is now 29. ? ?I would like to switch from atorvastatin to rosuvastatin 40 mg daily.  Continue current [place] but do not refill.  We will switch to rosuvastatin 40 mg daily.  Also add Zetia 10 mg daily. ? ?Reassess labs in 4 months. ? ?Glenetta Hew, MD ? ?

## 2021-07-30 ENCOUNTER — Encounter (HOSPITAL_COMMUNITY): Payer: Self-pay | Admitting: Internal Medicine

## 2021-07-30 ENCOUNTER — Ambulatory Visit (HOSPITAL_COMMUNITY)
Admission: RE | Admit: 2021-07-30 | Discharge: 2021-07-30 | Disposition: A | Discharge status: Home / Self Care | Payer: Managed Care, Other (non HMO) | Source: Ambulatory Visit | Attending: Internal Medicine | Admitting: Internal Medicine

## 2021-07-30 VITALS — BP 120/80 | HR 64 | Wt 236.6 lb

## 2021-07-30 DIAGNOSIS — I480 Paroxysmal atrial fibrillation: Secondary | ICD-10-CM | POA: Diagnosis not present

## 2021-07-30 DIAGNOSIS — Z8673 Personal history of transient ischemic attack (TIA), and cerebral infarction without residual deficits: Secondary | ICD-10-CM | POA: Diagnosis not present

## 2021-07-30 DIAGNOSIS — I5042 Chronic combined systolic (congestive) and diastolic (congestive) heart failure: Secondary | ICD-10-CM | POA: Insufficient documentation

## 2021-07-30 DIAGNOSIS — I2510 Atherosclerotic heart disease of native coronary artery without angina pectoris: Secondary | ICD-10-CM | POA: Insufficient documentation

## 2021-07-30 DIAGNOSIS — Z7982 Long term (current) use of aspirin: Secondary | ICD-10-CM | POA: Insufficient documentation

## 2021-07-30 DIAGNOSIS — Z7984 Long term (current) use of oral hypoglycemic drugs: Secondary | ICD-10-CM | POA: Insufficient documentation

## 2021-07-30 DIAGNOSIS — Z09 Encounter for follow-up examination after completed treatment for conditions other than malignant neoplasm: Secondary | ICD-10-CM | POA: Diagnosis not present

## 2021-07-30 DIAGNOSIS — I252 Old myocardial infarction: Secondary | ICD-10-CM | POA: Diagnosis not present

## 2021-07-30 DIAGNOSIS — Z8674 Personal history of sudden cardiac arrest: Secondary | ICD-10-CM | POA: Insufficient documentation

## 2021-07-30 DIAGNOSIS — Z79899 Other long term (current) drug therapy: Secondary | ICD-10-CM | POA: Diagnosis not present

## 2021-07-30 DIAGNOSIS — Z7901 Long term (current) use of anticoagulants: Secondary | ICD-10-CM | POA: Diagnosis not present

## 2021-07-30 DIAGNOSIS — I5022 Chronic systolic (congestive) heart failure: Secondary | ICD-10-CM | POA: Diagnosis not present

## 2021-07-30 DIAGNOSIS — Z7902 Long term (current) use of antithrombotics/antiplatelets: Secondary | ICD-10-CM | POA: Insufficient documentation

## 2021-07-30 DIAGNOSIS — I4892 Unspecified atrial flutter: Secondary | ICD-10-CM | POA: Insufficient documentation

## 2021-07-30 DIAGNOSIS — Z955 Presence of coronary angioplasty implant and graft: Secondary | ICD-10-CM | POA: Diagnosis not present

## 2021-07-30 DIAGNOSIS — Z9861 Coronary angioplasty status: Secondary | ICD-10-CM

## 2021-07-30 DIAGNOSIS — I471 Supraventricular tachycardia: Secondary | ICD-10-CM | POA: Insufficient documentation

## 2021-07-30 MED ORDER — LOSARTAN POTASSIUM 25 MG PO TABS: 25.0000 mg | ORAL_TABLET | Freq: Every evening | ORAL | 3 refills | Status: DC

## 2021-07-30 NOTE — Progress Notes (Signed)
?PCP: ?Cardiology: Dr Herbie BaltimoreHarding ?Primary HF Cardiologist: Dr Gala RomneyBensimhon  ? ?HEART FAILURE CLINIC NOTE ? ?HPI: ?Mr Edward NovakHinshaw is a 51 year old with history of CAD, STEMI, VF arrest 07/11/19 with iCM and CVAs.  ? ?In 3/21 presented with acute anterior STEMI complicated by witnessed VF. He had a total of 40-45 minutes of ACLS before ROSC  LHC showed 99% proximal LAD occlusion treated with PCI/DES to LAD. No other significant disease. He was started on DAPT with Aspirin and Brilinta. Brilinta was stopped and plavix started with the addition of Eliquis (for aflutter). TEE DC-CV 3/20 with conversion to NSR EF 30-35%.  ? ?Echo 6/21 EF 35-40% ? ?cMRI 9/21 EF 39% RV normal. Mild to moderate MR ? ?Echo 5/22 EF 30-35% No MR ? ?Zio 9/22 ?1. Sinus rhythm - avg HR of 70 bpm Rhythm. ?2. Three runs of SVT occurred, the run with the fastest interval lasting 5 beats ?with a max rate of 182 bpm, the longest lasting 9 beats with an avg rate of 146 bpm.  ?3. Supraventricular Tachycardia was detected within +/- 45 seconds of symptomatic patient event(s).  ?4. Rare PACs and PVCs.  ?5. Most patient-triggered events associated with isolated PVCs. ? ?Today he returns for HF follow up. Feels good. Back to work. 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 Walking 5-7 miles/day. No CP or SOB. Taking all meds.  ? ?LHC 07/11/19   ?Anterior STEMI complicated by VF arrest.   ?Severe single-vessel CAD with proximal LAD 99% thrombotic occlusion (culprit lesion).  Otherwise minimal disease elsewhere. ?Successful DES PCI of proximal LAD using resolute Onyx DES 3.5 x 18 mm postdilated to 4.0 mm. ?Systolic hypotension with borderline cardiogenic shock with systolic pressures in the 90s over 60s requiring Levophed at 10 mcg/min. -->  Cardiac output-index 4.77-2.04; cardiac power 0.8. ? No evidence of pulmonary hypertension ? ?ROS: All systems negative except as listed in HPI, PMH and Problem List. ? ?SH:  ?Social History  ? ?Socioeconomic History  ?  Marital status: Married  ?  Spouse name: Not on file  ? Number of children: Not on file  ? Years of education: Not on file  ? Highest education level: Not on file  ?Occupational History  ? Occupation: Janitor  ?  Employer: [employer]  ?Tobacco Use  ? Smoking status: Former  ?  Packs/day: 1.00  ?  Years: 20.00  ?  Pack years: 20.00  ?  Types: Cigarettes  ?  Start date: 2000  ?  Quit date: 07/11/2019  ?  Years since quitting: 2.0  ? Smokeless tobacco: Never  ?Substance and Sexual Activity  ? Alcohol use: Not Currently  ? Drug use: Not Currently  ? Sexual activity: Yes  ?Other Topics Concern  ? Not on file  ?Social History Narrative  ? Not on file  ? ?Social Determinants of Health  ? ?Financial Resource Strain: Not on file  ?Food Insecurity: Not on file  ?Transportation Needs: Not on file  ?Physical Activity: Not on file  ?Stress: Not on file  ?Social Connections: Not on file  ?Intimate Partner Violence: Not on file  ? ? ?FH:  ?Family History  ?Family history unknown: Yes  ? ? ?Past Medical History:  ?Diagnosis Date  ? Acute ST elevation myocardial infarction (STEMI) involving left anterior descending coronary artery (HCC)   ? Presented as VF arrest -> 45 minutes CPR with ROSC => Ant STEMI: Cath = 95% thrombotic/hazy prox-mid LAD -> DES PCI. ->  Severe Ischemic Cardiomyopathy (EF improved from 15% to 35-40%).  ? Cardiac arrest with ventricular fibrillation (HCC) 07/11/2019  ? Chronic combined systolic and diastolic CHF, NYHA class 1 (HCC) 07/2019  ? Initial EF of 50% improved to 35-40%.  Class I-II CHF  ? History of multiple strokes 07/22/2019  ? Multiple CVAs noted on MRI post CARDIAC ARREST-prolonged CPR (innumerable punctate acute infarctions scattered throughout the cerebellum and s bilateral cerebellar hemispheres most consistent with micro emboli) no residual symptoms.  ? Hyperlipidemia with target LDL less than 70 07/22/2019  ? Ischemic cardiomyopathy -> EF ~35-40% by CMRI 07/11/2019  ? following  anterior MI and cardiac arrest-initial EF 15% improved to 35-40% 3 months post PCI.  ? Paroxysmal atrial fibrillation/a atrial flutter ->  in setting of post MI/cardiac arrest 07/18/2019  ? Had PAF-flutter following rewarming from [place] after cardiac arrest: TEE DCCV on 07/21/2019  ? Single vessel coronary disease 07/11/2019  ? Anterior STEMI complicated by VF arrest.  1V CAD - 95-99% p-mLAD => DES PCI: Resolute Onyx DES 3.5 x 18 (post-dilated to 4.0 mm)  ? Smoker 07/13/2019  ? ? ?Current Outpatient Medications  ?Medication Sig Dispense Refill  ? apixaban (ELIQUIS) 5 MG TABS tablet Take 1 tablet (5 mg total) by mouth 2 (two) times daily. 60 tablet 5  ? carvedilol (COREG) 3.125 MG tablet Take 1 tablet (3.125 mg total) by mouth 2 (two) times daily. 30 tablet 0  ? citalopram (CELEXA) 10 MG tablet TAKE ONE TABLET BY MOUTH DAILY 330 tablet 0  ? clopidogrel (PLAVIX) 75 MG tablet Take 1 tablet (75 mg total) by mouth daily. 90 tablet 3  ? ezetimibe (ZETIA) 10 MG tablet Take 1 tablet (10 mg total) by mouth daily. 90 tablet 3  ? FARXIGA 10 MG TABS tablet TAKE ONE TABLET (10MG  TOTAL) BY MOUTH DAILY BEFORE BREAKFAST 90 tablet 1  ? furosemide (LASIX) 20 MG tablet Take 1 tablet (20 mg total) by mouth daily as needed. Use as needed for lower leg swelling 90 tablet 3  ? nitroGLYCERIN (NITROSTAT) 0.4 MG SL tablet DISSOLVE 1 TABLET UNDER TONGUE EVERY 5 MINUTES AS NEEDED FOR CHEST PAIN 30 tablet 2  ? pantoprazole (PROTONIX) 40 MG tablet Take 1 tablet (40 mg total) by mouth daily. 30 tablet 0  ? rosuvastatin (CRESTOR) 40 MG tablet Take 1 tablet (40 mg total) by mouth daily. 90 tablet 3  ? spironolactone (ALDACTONE) 25 MG tablet Take 1 tablet (25 mg total) by mouth daily. 30 tablet 0  ? ?No current facility-administered medications for this encounter.  ? ? ?Vitals:  ? 07/30/21 0953  ?BP: 120/80  ?Pulse: 64  ?SpO2: 97%  ?Weight: 107.3 kg (236 lb 9.6 oz)  ? ? ? ?PHYSICAL EXAM: ?General:  Well appearing. No resp difficulty ?HEENT:  normal ?Neck: supple. no JVD. Carotids 2+ bilat; no bruits. No lymphadenopathy or thryomegaly appreciated. ?Cor: PMI nondisplaced. Regular rate & rhythm. No rubs, gallops or murmurs. ?Lungs: clear ?Abdomen: soft, nontender, nondistended. No hepatosplenomegaly. No bruits or masses. Good bowel sounds. ?Extremities: no cyanosis, clubbing, rash, edema ?Neuro: alert & orientedx3, cranial nerves grossly intact. moves all 4 extremities w/o difficulty. Affect pleasant ? ? ?ASSESSMENT & PLAN: ? ?1. CAD ?- LHC 3/21 showed 99% proximal LAD thrombotic occlusion, s/p PCI + DES placement. ?- Active. No s/s angina ?- We discussed AC at length. Now > 1 year after stent. Can stop Plavix. No AFL on monitor but still with SVT episodes -> will continue Eliquis  ?-  Lipids managed by Dr. [name] -> now on Crestor and Zetia ? ?2. Chronic Systolic Heart Failure  ?-Echo 07/11/19 EF 15%. RV systolic function mildly reduced  ?- f/u echo on 3/13 LVEF 20-25%, TEE 07/21/19 EF 30-35% ?- Echo 6/21 EF 35-40% ?- cMRI 9/21 EF 39% RV normal. Mild to moderate MR ?- Echo 5/22 EF 30-35% (I thought 35-40% with anterior and anteroseptal AK) ?- Remains NYHA I  ?- Volume status ok. Not on lasix ?- Continue carvedilol 3.125 mg twice a day  ?- Off Entresto due to low BP. Start losartan 25 qhs  ?- Continue spiro 25 mg daily.  ?- Continue Farxiga 10  ?- No ICD for now with EF 35-40% and NYHA I. Will repeat echo  ? ?3. H/O VF Arrest in the setting of MI.  ?- Plan as above ? ?4. Multiple CVAs as on MRI  ?- no residual ? ?5. Atrial flutter, paroxysmal ?- occurred post-MI, no recurrence  ?- Zio 9/22 no AF ?- We discussed AC at length. Now > 1 year after stent. Can stop Plavix. No AFL on monitor but still with SVT episodes -> will continue Eliquis  ? ?Arvilla Meres, MD  ?10:13 AM ? ? ? ?

## 2021-07-30 NOTE — Patient Instructions (Signed)
Stop Plavix ? ?Start Losartan 25 mg nightly. ? ?Your physician has requested that you have an echocardiogram. Echocardiography is a painless test that uses sound waves to create images of your heart. It provides your doctor with information about the size and shape of your heart and how well your heart?s chambers and valves are working. This procedure takes approximately one hour. There are no restrictions for this procedure. ? ?Your physician recommends that you schedule a follow-up appointment in: 1 year ( March 2024)  **please call the office in January 2024 to arrange your follow up appointment ** ? ?If you have any questions or concerns before your next appointment please send [place] a message through [place] or call our office at [phone].   ? ?TO LEAVE A MESSAGE FOR THE NURSE SELECT OPTION 2, PLEASE LEAVE A MESSAGE INCLUDING: ?YOUR NAME ?DATE OF BIRTH ?CALL BACK NUMBER ?REASON FOR CALL**this is important as we prioritize the call backs ? ?YOU WILL RECEIVE A CALL BACK THE SAME DAY AS LONG AS YOU CALL BEFORE 4:00 PM ? ?At the [facility], you and your health needs are our priority. As part of our continuing mission to provide you with exceptional heart care, we have created designated Provider Care Teams. These Care Teams include your primary Cardiologist (physician) and Advanced Practice Providers (APPs- Physician Assistants and Nurse Practitioners) who all work together to provide you with the care you need, when you need it.  ? ?You may see any of the following providers on your designated Care Team at your next follow up: ?Dr Glori Bickers ?Dr Loralie Champagne ?Darrick Grinder, NP ?Lyda Jester, PA ?Jessica Milford,NP ?Marlyce Huge, PA ?Audry Riles, PharmD ? ? ?Please be sure to bring in all your medications bottles to every appointment.  ? ? ?

## 2021-07-30 NOTE — Addendum Note (Signed)
Encounter addended by: Jerl Mina, RN on: 07/30/2021 10:42 AM ? Actions taken: Diagnosis association updated, Order list changed, Clinical Note Signed

## 2021-08-04 ENCOUNTER — Other Ambulatory Visit: Payer: Self-pay

## 2021-08-04 MED ORDER — CARVEDILOL 3.125 MG PO TABS: 3.1250 mg | ORAL_TABLET | Freq: Two times a day (BID) | ORAL | 3 refills | Status: DC

## 2021-08-04 MED ORDER — SPIRONOLACTONE 25 MG PO TABS: 25.0000 mg | ORAL_TABLET | Freq: Every day | ORAL | 3 refills | Status: DC

## 2021-08-04 MED ORDER — PANTOPRAZOLE SODIUM 40 MG PO TBEC: 40.0000 mg | DELAYED_RELEASE_TABLET | Freq: Every day | ORAL | 3 refills | Status: DC

## 2021-08-04 MED ORDER — NITROGLYCERIN 0.4 MG SL SUBL: SUBLINGUAL_TABLET | SUBLINGUAL | 11 refills | Status: DC

## 2021-08-05 ENCOUNTER — Other Ambulatory Visit: Payer: Self-pay

## 2021-08-05 MED ORDER — PANTOPRAZOLE SODIUM 40 MG PO TBEC: 40.0000 mg | DELAYED_RELEASE_TABLET | Freq: Every day | ORAL | 3 refills | Status: DC

## 2021-08-05 MED ORDER — SPIRONOLACTONE 25 MG PO TABS: 25.0000 mg | ORAL_TABLET | Freq: Every day | ORAL | 3 refills | Status: DC

## 2021-08-05 MED ORDER — CARVEDILOL 3.125 MG PO TABS: 3.1250 mg | ORAL_TABLET | Freq: Two times a day (BID) | ORAL | 3 refills | Status: DC

## 2021-08-05 MED ORDER — NITROGLYCERIN 0.4 MG SL SUBL: SUBLINGUAL_TABLET | SUBLINGUAL | 11 refills | Status: DC

## 2021-08-05 NOTE — Telephone Encounter (Signed)
Spironolactone , Coreg, Nitro and Protonix may look like it was sent twice but yesterday 08/04/2021 The electronic refill did not go through successfully due to the pharmacy's Internet being down, so I had to resend it this morning.   ?

## 2021-08-13 ENCOUNTER — Ambulatory Visit (HOSPITAL_COMMUNITY)
Admission: RE | Admit: 2021-08-13 | Discharge: 2021-08-13 | Disposition: A | Discharge status: Home / Self Care | Payer: Managed Care, Other (non HMO) | Source: Ambulatory Visit | Attending: Internal Medicine | Admitting: Internal Medicine

## 2021-08-13 DIAGNOSIS — I4891 Unspecified atrial fibrillation: Secondary | ICD-10-CM | POA: Insufficient documentation

## 2021-08-13 DIAGNOSIS — Z8673 Personal history of transient ischemic attack (TIA), and cerebral infarction without residual deficits: Secondary | ICD-10-CM | POA: Diagnosis not present

## 2021-08-13 DIAGNOSIS — I2510 Atherosclerotic heart disease of native coronary artery without angina pectoris: Secondary | ICD-10-CM | POA: Insufficient documentation

## 2021-08-13 DIAGNOSIS — I5022 Chronic systolic (congestive) heart failure: Secondary | ICD-10-CM

## 2021-08-13 DIAGNOSIS — I358 Other nonrheumatic aortic valve disorders: Secondary | ICD-10-CM

## 2021-08-13 DIAGNOSIS — Z8674 Personal history of sudden cardiac arrest: Secondary | ICD-10-CM | POA: Insufficient documentation

## 2021-08-13 DIAGNOSIS — Z951 Presence of aortocoronary bypass graft: Secondary | ICD-10-CM | POA: Diagnosis not present

## 2021-08-13 LAB — ECHOCARDIOGRAM COMPLETE
AR max vel: 4.33 cm2
AV Peak grad: 2.9 mmHg
Ao pk vel: 0.86 m/s
Area-P 1/2: 7.22 cm2
Calc EF: 34 %
S' Lateral: 5.2 cm
Single Plane A2C EF: 36.3 %
Single Plane A4C EF: 33.8 %

## 2021-08-13 IMAGING — DX DG CHEST 1V
1 series · 1 of 1 positions shown · non-contrast
Comparison: 07/14/2019 at 5997 hours

CLINICAL DATA: ETT

EXAM:
CHEST  1 VIEW

[chest ap]
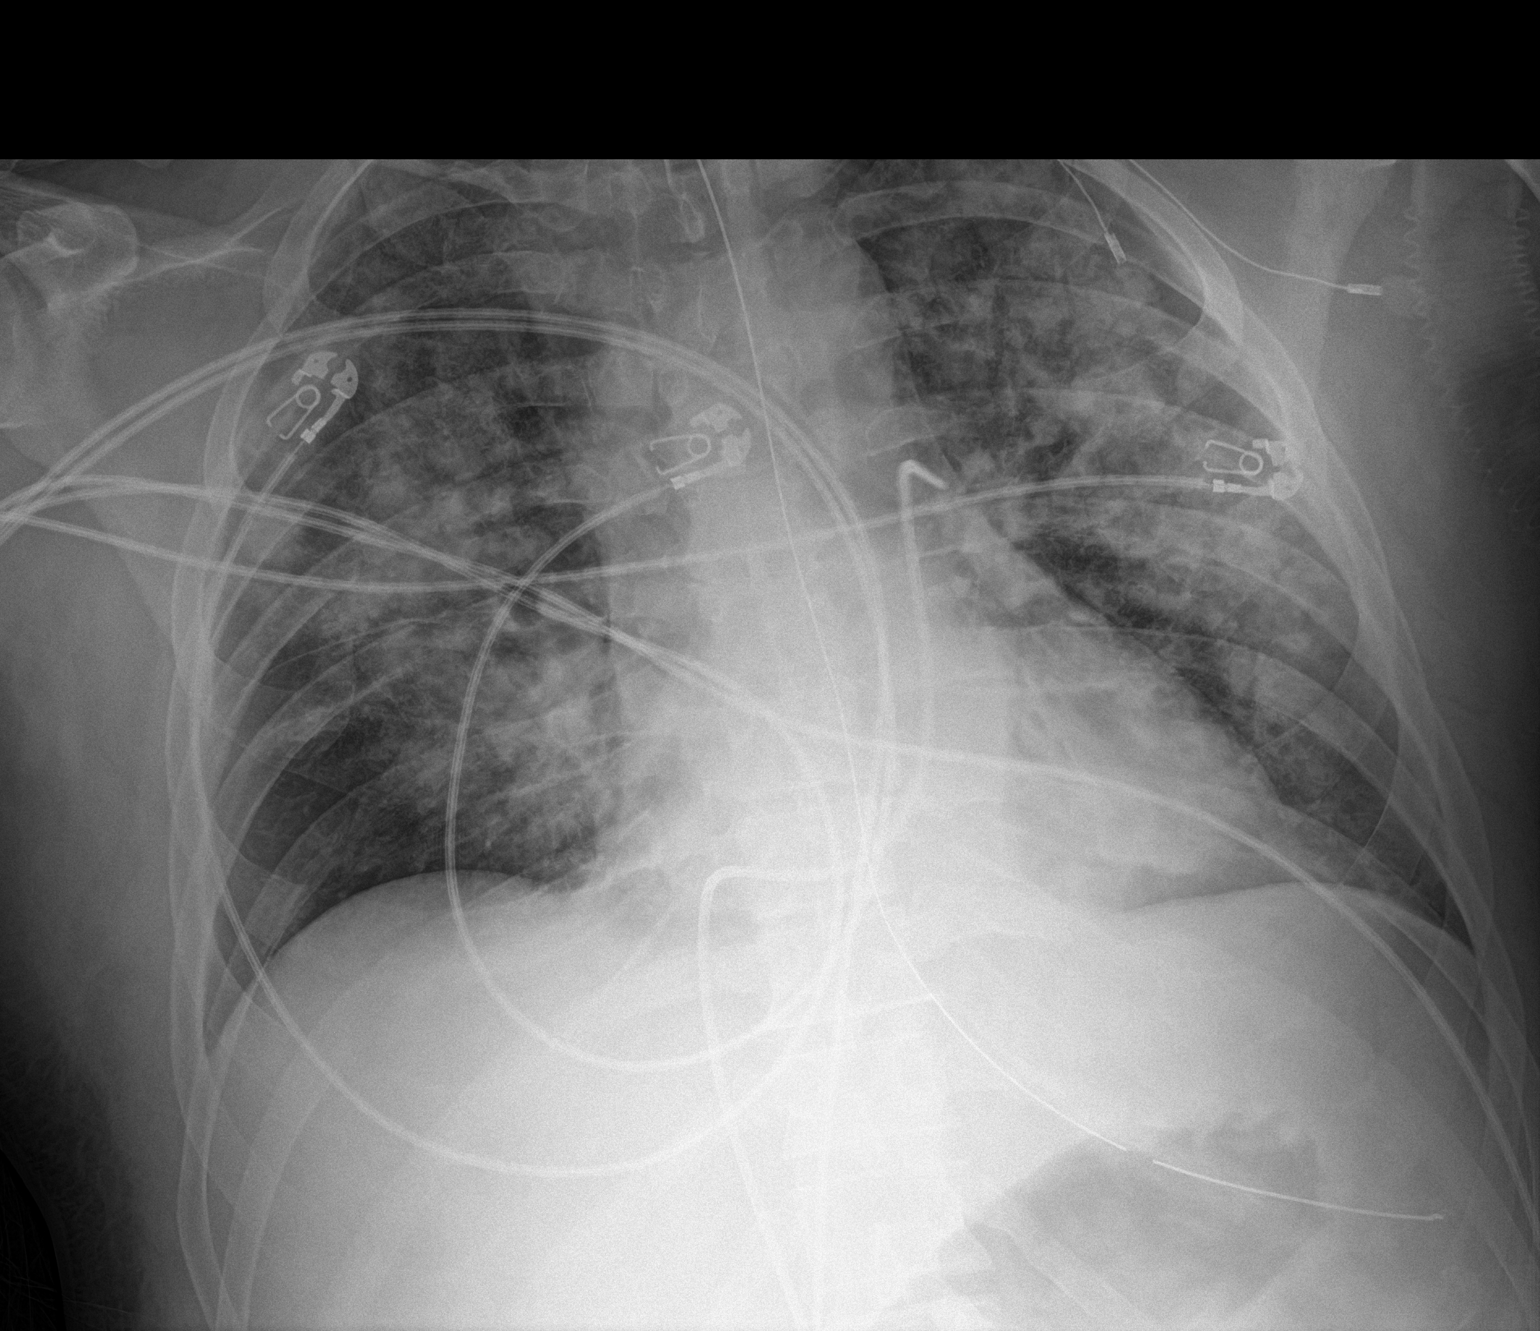

[1 of 1 positions shown; findings below may reference images not displayed]

FINDINGS: Endotracheal tube terminates 5.5 cm above the carina.

Enteric tube terminates in the proximal gastric body.

Multifocal patchy airspace opacities in the lungs bilaterally, upper
lung predominant. No pleural effusion or pneumothorax.

The heart is normal in size. Inferior approach Swan-Ganz catheter
terminates in the left main pulmonary artery.
IMPRESSION: Endotracheal tube terminates 5.5 cm above the carina.

Additional support apparatus as above.

Multifocal patchy opacities, suggesting multifocal pneumonia,
suspicious for infection/aspiration, grossly unchanged.

## 2021-08-13 IMAGING — CT CT HEAD W/O CM
4 series · 15 of 47 positions shown, 17 images · non-contrast
Comparison: Prior CT from 07/11/2019.

CLINICAL DATA: Initial evaluation for acute encephalopathy,
somnolence.

EXAM:
CT HEAD WITHOUT CONTRAST
TECHNIQUE: Contiguous axial images were obtained from the base of the skull
through the vertex without intravenous contrast.

[Series 3: head bone · axial · 0.46mm/px · z∈[-134,-118]mm · 2 of 84 slices shown]
[im 9/84  bone]
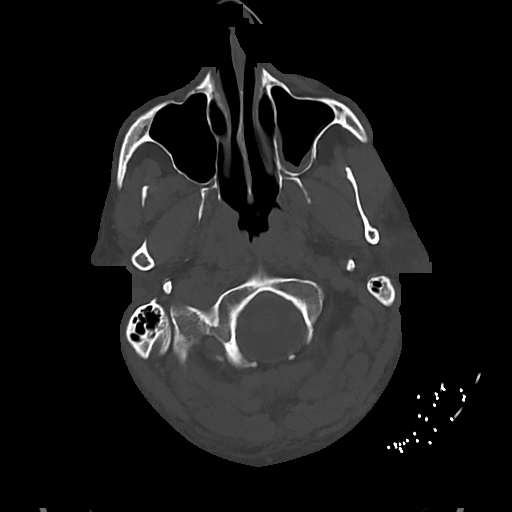
[im 17/84  bone]
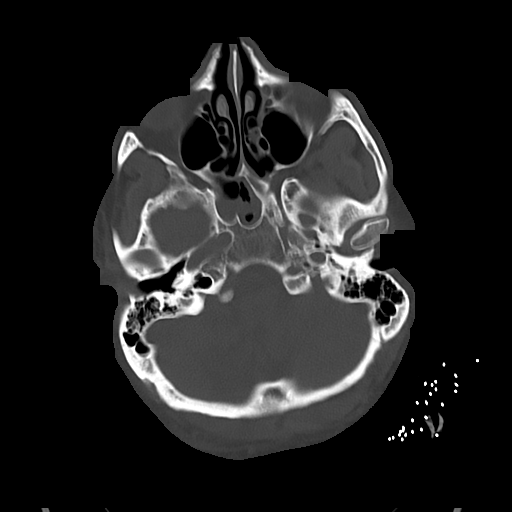

[Series 4: head wo · axial · 0.46mm/px · z∈[-130,-10]mm · 7 of 34 slices shown, 9 images]
[im 5/34  brain]
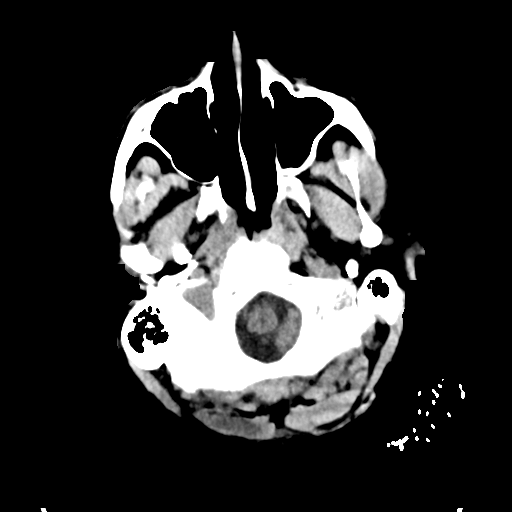
[im 5/34  bone]
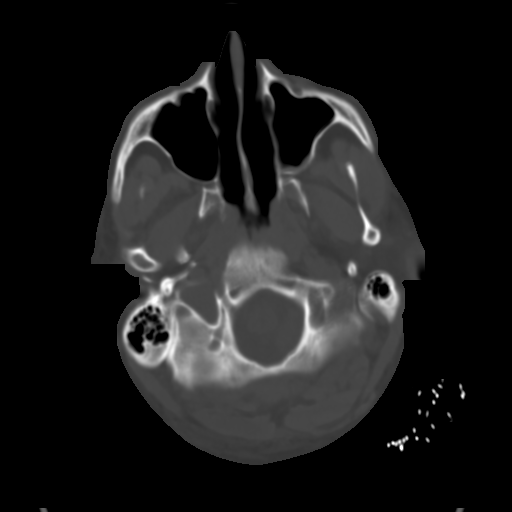
[im 9/34  brain]
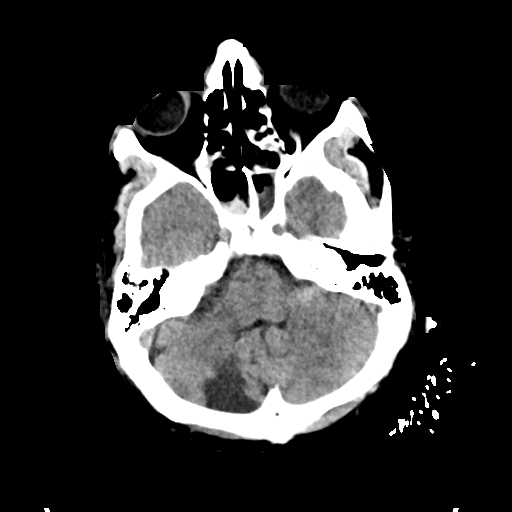
[im 13/34  brain]
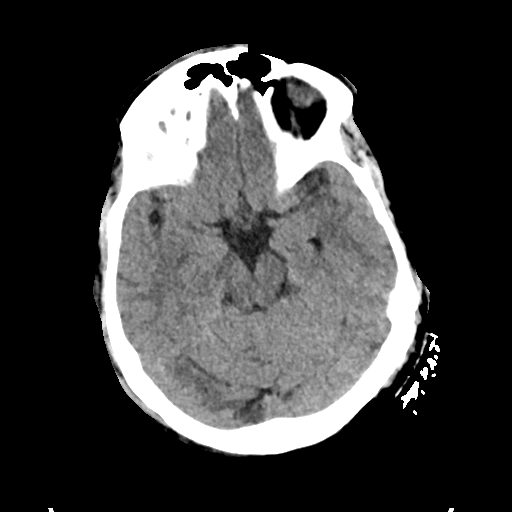
[im 17/34  brain]
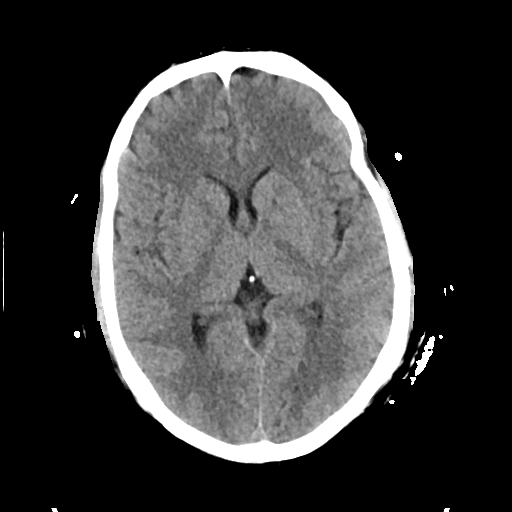
[im 21/34  brain]
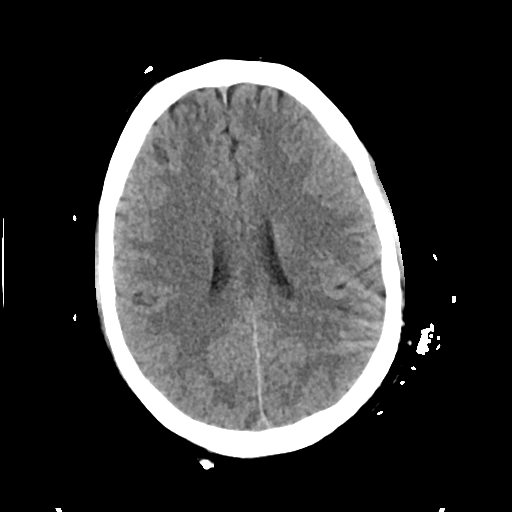
[im 21/34  bone]
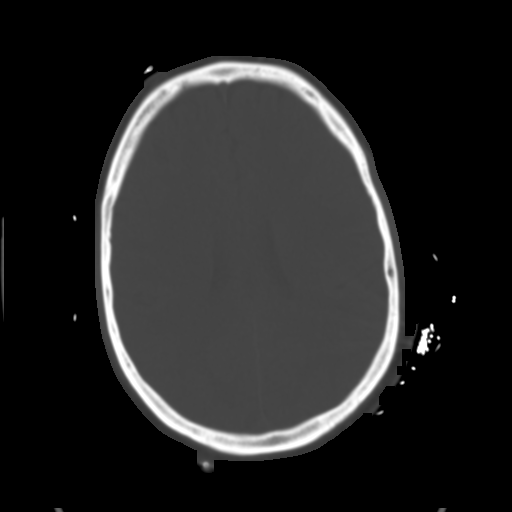
[im 25/34  brain]
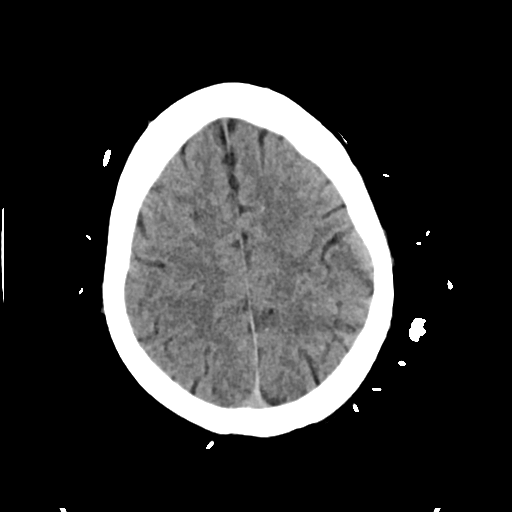
[im 29/34  brain]
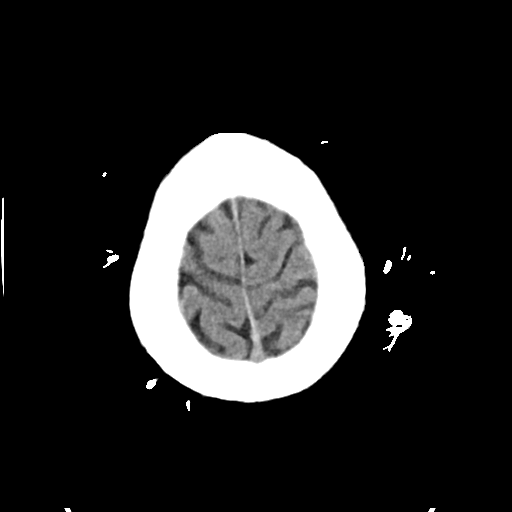

[Series 5: cor soft · coronal · 0.32mm/px · 3 of 75 slices shown]
[im 25/75  brain]
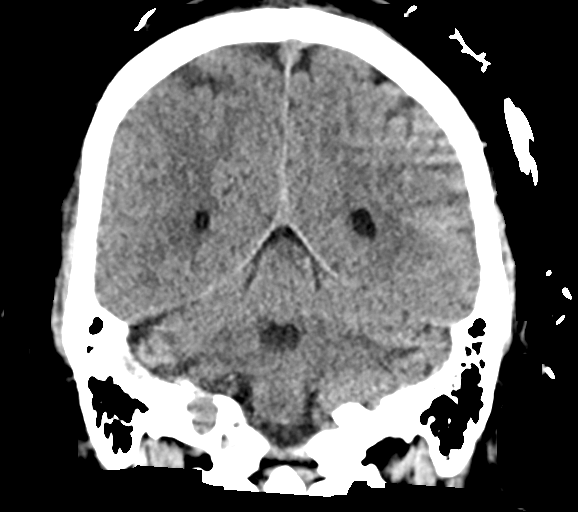
[im 33/75  brain]
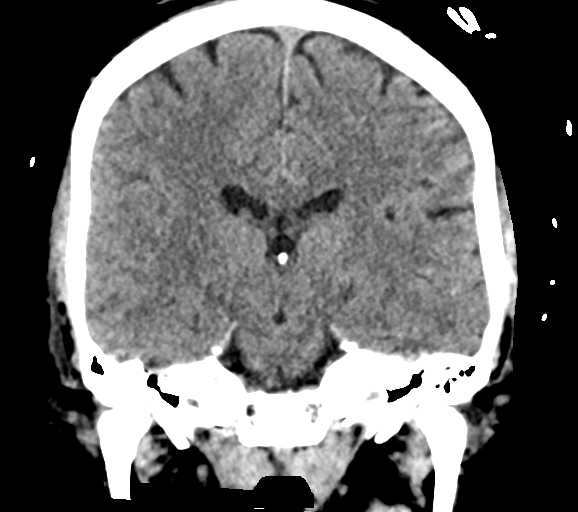
[im 42/75  brain]
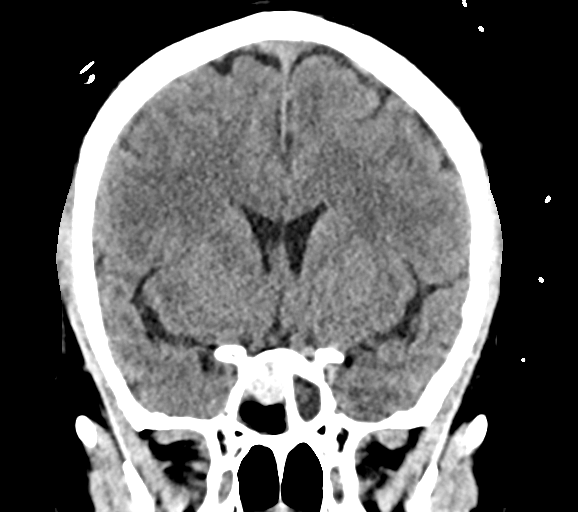

[Series 6: sag soft · sagittal · 0.32mm/px · 3 of 62 slices shown]
[im 21/62  brain]
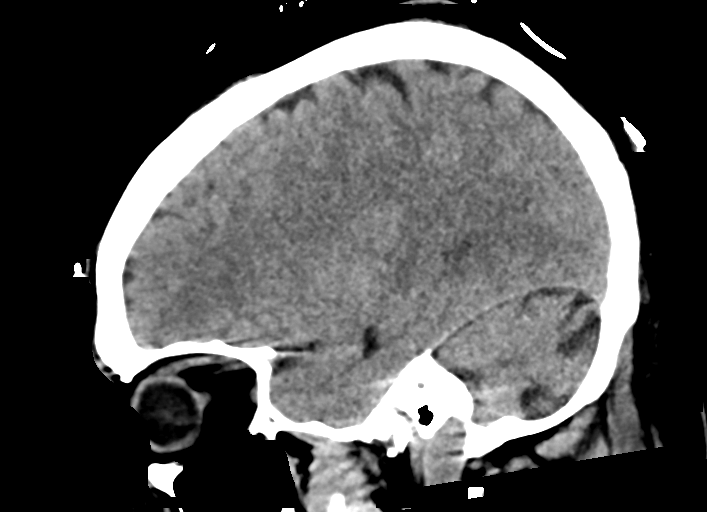
[im 31/62  brain]
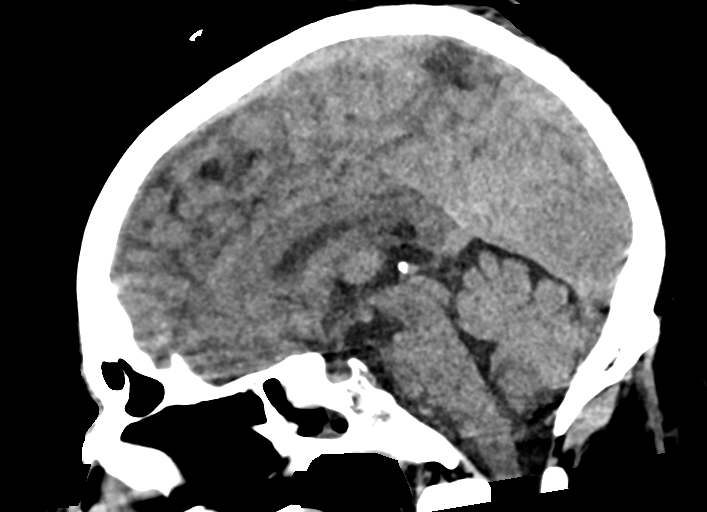
[im 41/62  brain]
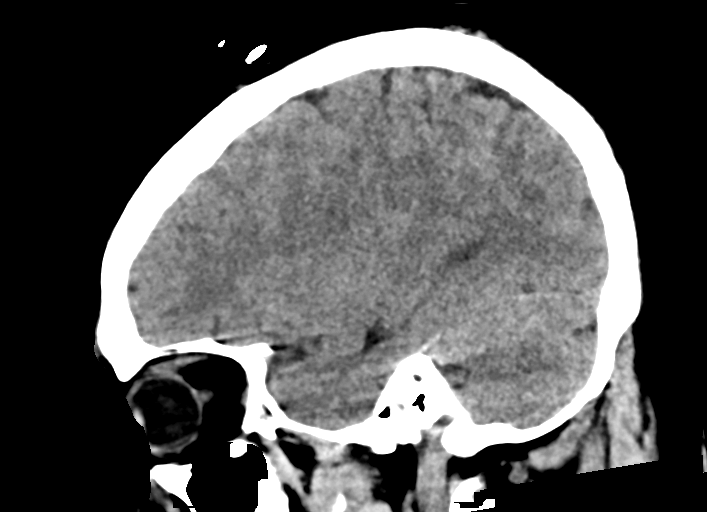

[15 of 47 positions shown; findings below may reference images not displayed]

FINDINGS: Brain: Urine volume within normal limits. Chronic appearing right
cerebellar infarct again noted.

There is a new wedge-shaped focal hypodensity involving the
parasagittal right occipital lobe (series 4, image 19), suspicious
for an evolving acute ischemic infarct, right PCA territory (series
4, image 19). Additionally, subtle hypodensity seen at the
subcortical white matter of the anterior right frontal lobe, age
indeterminate, but could also reflect sequelae of evolving ischemia
(series 4, image 24). Gray-white matter differentiation otherwise
maintained. No intracranial hemorrhage. No mass lesion or midline
shift. No hydrocephalus or extra-axial fluid collection.

Vascular: No hyperdense vessel. Scattered vascular calcifications
noted within the carotid siphons.

Skull: Scalp soft tissues within normal limits. Calvarium intact.

Sinuses/Orbits: Globes and orbital soft tissues within normal
limits. Moderate mucosal thickening in opacity within the sphenoid
ethmoidal sinuses and maxillary sinuses. Trace right mastoid
effusion.

Other: None.
IMPRESSION: 1. New wedge-shaped hypodensity involving the parasagittal right
occipital lobe, suspicious for an evolving acute ischemic infarct,
right PCA territory. Follow-up examination with dedicated MRI
suggested for confirmation.
2. Additional subtle hypodensity at the subcortical white matter of
the anterior right frontal lobe, age indeterminate, but could also
reflect sequelae of evolving ischemia.
3. Chronic right cerebellar infarct.
4. Moderate paranasal sinus disease.

## 2021-08-13 MED ORDER — PERFLUTREN LIPID MICROSPHERE
1.0000 mL | INTRAVENOUS | Status: AC | PRN
  Administered 2021-08-13: 2 mL via INTRAVENOUS

## 2021-08-14 IMAGING — DX DG CHEST 1V
1 series · 1 of 1 positions shown · non-contrast
Comparison: 07/14/2019

CLINICAL DATA: Fell respiratory distress syndrome.

EXAM:
CHEST  1 VIEW

[chest pa]
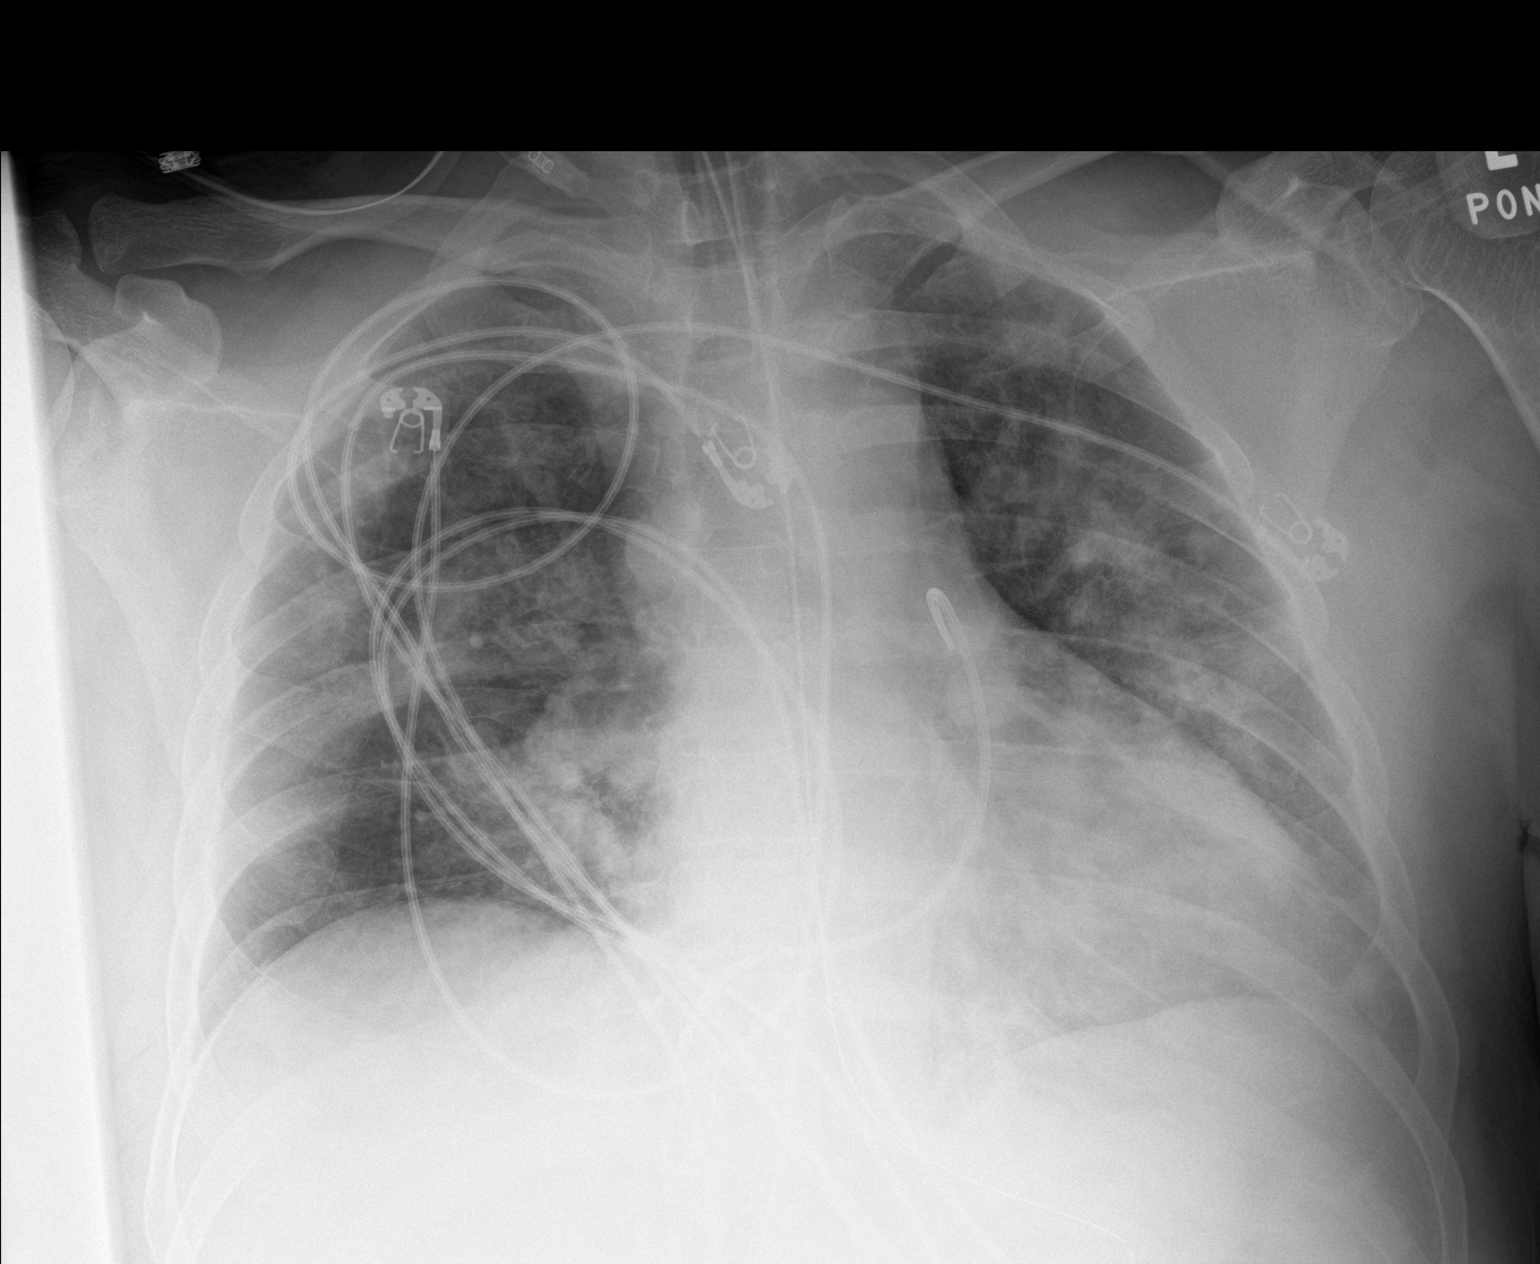

[1 of 1 positions shown; findings below may reference images not displayed]

FINDINGS: Bilateral airspace lung opacities have improved from the previous
day's exam. No new lung abnormalities.

No convincing pleural effusion and no pneumothorax.

Tracheostomy tube, nasal/orogastric tube and femoral a placed
Swan-Ganz catheter are stable.
IMPRESSION: 1. Bilateral airspace lung opacities have improved compared to the
previous day's exam.
2. No other change. No new lung abnormalities. Stable support
apparatus.

## 2021-08-16 IMAGING — DX DG CHEST 1V PORT
1 series · 1 of 1 positions shown · non-contrast
Comparison: 07/15/2019

CLINICAL DATA: Respiratory failure

EXAM:
PORTABLE CHEST 1 VIEW

[chest ap]
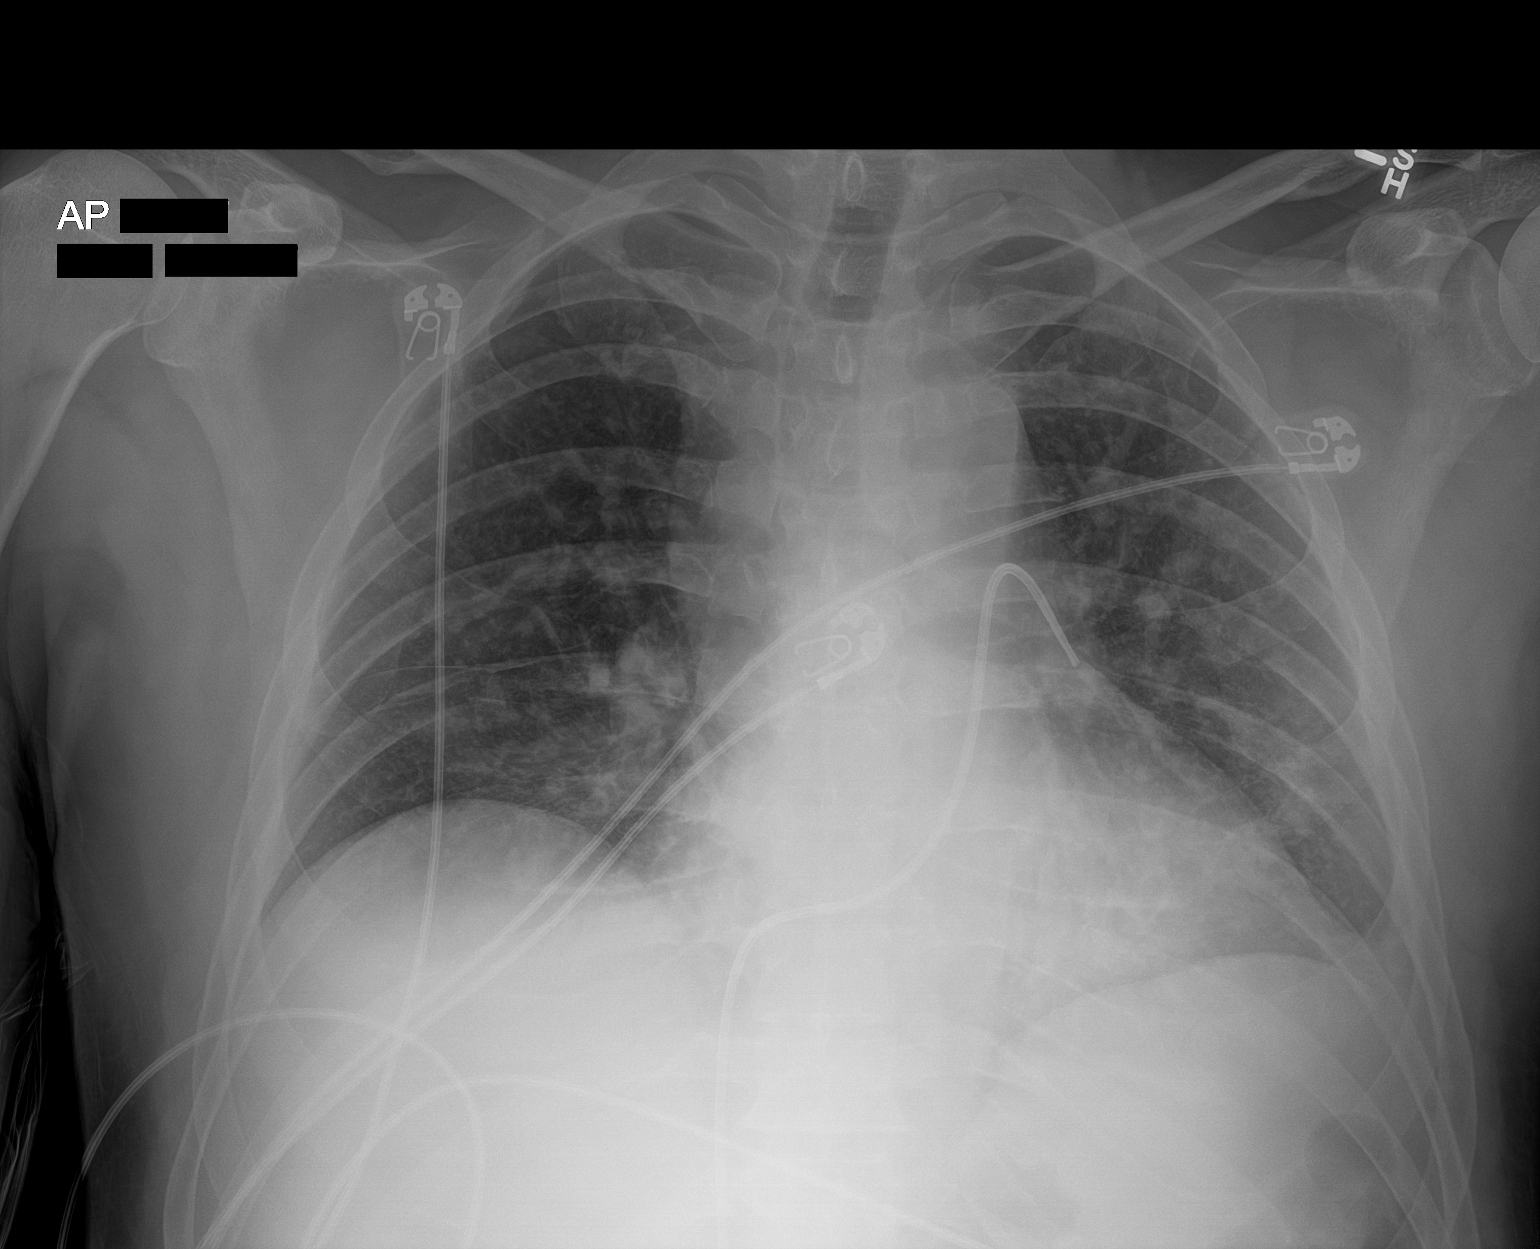

[1 of 1 positions shown; findings below may reference images not displayed]

FINDINGS: Interval endotracheal extubation. Inferior approach pulmonary
vascular catheter remains with tip directed into the left pulmonary
artery. Improved bilateral heterogeneous airspace opacity.
IMPRESSION: 1.  Interval endotracheal extubation.

2. Fall improved bilateral heterogeneous airspace opacity,
consistent with improved infection or edema.

## 2021-09-16 ENCOUNTER — Other Ambulatory Visit (HOSPITAL_COMMUNITY): Payer: Self-pay | Admitting: Internal Medicine

## 2021-10-01 ENCOUNTER — Other Ambulatory Visit: Payer: Self-pay

## 2021-10-01 MED ORDER — CITALOPRAM HYDROBROMIDE 10 MG PO TABS: 10.0000 mg | ORAL_TABLET | Freq: Every day | ORAL | 3 refills | Status: DC

## 2021-10-05 ENCOUNTER — Encounter: Payer: Self-pay | Admitting: Cardiology

## 2021-10-05 MED ORDER — APIXABAN 5 MG PO TABS: 5.0000 mg | ORAL_TABLET | Freq: Two times a day (BID) | ORAL | 1 refills | Status: DC

## 2021-10-05 NOTE — Telephone Encounter (Signed)
Per chart review, citalopram was refilled under Cadence PA's name and Dr. Elissa Hefty name (most recently 11/06/20)  Routed to MD/RN to review

## 2021-10-05 NOTE — Telephone Encounter (Signed)
50 M 109.2 kg, SCr 1.13, per Bensimhon note will continue with Eliquis

## 2021-10-06 ENCOUNTER — Other Ambulatory Visit: Payer: Self-pay

## 2021-10-06 MED ORDER — APIXABAN 5 MG PO TABS: 5.0000 mg | ORAL_TABLET | Freq: Two times a day (BID) | ORAL | 1 refills | Status: DC

## 2021-10-09 ENCOUNTER — Other Ambulatory Visit: Payer: Self-pay | Admitting: Cardiology

## 2021-10-09 NOTE — Telephone Encounter (Signed)
I guess somehow it got discontinued.  Probably a click of the button that was wrong.  This is not a medicine that I typically prescribe, but I think in this case we have filled it.  It is still listed on his med list not sure why it was denied.  Will need to see if Jasmine December can figure out what happened.    DH

## 2021-10-29 ENCOUNTER — Encounter: Payer: Self-pay | Admitting: Cardiology

## 2021-11-01 NOTE — Telephone Encounter (Signed)
Sorry to hear that you are having the side effects.  Lets see if we can stop the Zetia and see if that makes a difference.  We can recheck labs in a week or 2.  See how you feel after being off Zetia for 2 weeks.  If there is a notable improvement, then simply stay on rosuvastatin.  If not, we should get you into see our lipid clinic to discuss other options of treatment because we were not able to reach target levels on atorvastatin.  It is very unlikely that if you are not tolerating rosuvastatin that she would try to tolerate atorvastatin at the doses required to get the lipids down.\\   Bryan Lemma, MD

## 2021-11-16 DIAGNOSIS — Z9861 Coronary angioplasty status: Secondary | ICD-10-CM | POA: Diagnosis not present

## 2021-11-16 DIAGNOSIS — I2510 Atherosclerotic heart disease of native coronary artery without angina pectoris: Secondary | ICD-10-CM | POA: Diagnosis not present

## 2021-11-16 DIAGNOSIS — E785 Hyperlipidemia, unspecified: Secondary | ICD-10-CM | POA: Diagnosis not present

## 2021-11-16 LAB — LIPID PANEL
Chol/HDL Ratio: 3.7 ratio (ref 0.0–5.0)
Cholesterol, Total: 162 mg/dL (ref 100–199)
HDL: 44 mg/dL (ref >39)
LDL Chol Calc (NIH): 87 mg/dL (ref 0–99)
Triglycerides: 181 mg/dL — ABNORMAL HIGH (ref 0–149)
VLDL Cholesterol Cal: 31 mg/dL (ref 5–40)

## 2021-11-16 LAB — HEPATIC FUNCTION PANEL
ALT: 22 IU/L (ref 0–44)
AST: 21 IU/L (ref 0–40)
Albumin: 4.4 g/dL (ref 4.1–5.1)
Alkaline Phosphatase: 103 IU/L (ref 44–121)
Bilirubin Total: 0.6 mg/dL (ref 0.0–1.2)
Bilirubin, Direct: 0.15 mg/dL (ref 0.00–0.40)
Total Protein: 6.8 g/dL (ref 6.0–8.5)

## 2021-12-03 ENCOUNTER — Encounter: Payer: Self-pay | Admitting: Cardiology

## 2021-12-03 DIAGNOSIS — I2510 Atherosclerotic heart disease of native coronary artery without angina pectoris: Secondary | ICD-10-CM

## 2021-12-03 DIAGNOSIS — E785 Hyperlipidemia, unspecified: Secondary | ICD-10-CM

## 2021-12-03 NOTE — Telephone Encounter (Signed)
Even to get a more information on this.  I have no idea what this conversation is based on.  There was a question about holding the Zetia recently, and I wanted to try to get him into see our clinical pharmacist to discuss his lipids.  Bryan Lemma, MD

## 2021-12-17 ENCOUNTER — Encounter: Payer: Self-pay | Admitting: Cardiology

## 2021-12-17 ENCOUNTER — Encounter (HOSPITAL_COMMUNITY): Payer: Self-pay | Admitting: Internal Medicine

## 2021-12-22 ENCOUNTER — Encounter: Payer: Self-pay | Admitting: Cardiology

## 2021-12-24 NOTE — Telephone Encounter (Signed)
Co-Q10 can definitely help for some people.  Recommendation would be to start at 100 mg daily and then increase up to 300 mg daily.  Can take 1 3 times a day or 3 tabs altogether.  Bryan Lemma, MD

## 2021-12-25 ENCOUNTER — Ambulatory Visit (INDEPENDENT_AMBULATORY_CARE_PROVIDER_SITE_OTHER): Payer: Self-pay | Admitting: Pharmacist

## 2021-12-25 ENCOUNTER — Encounter: Payer: Self-pay | Admitting: Pharmacist

## 2021-12-25 DIAGNOSIS — E785 Hyperlipidemia, unspecified: Secondary | ICD-10-CM

## 2021-12-25 DIAGNOSIS — I2102 ST elevation (STEMI) myocardial infarction involving left anterior descending coronary artery: Secondary | ICD-10-CM

## 2021-12-25 DIAGNOSIS — Z8673 Personal history of transient ischemic attack (TIA), and cerebral infarction without residual deficits: Secondary | ICD-10-CM

## 2021-12-25 NOTE — Patient Instructions (Addendum)
It was nice meeting you today  We would like your LDL (bad cholesterol) to be less than 55  We would like to start a new medication called Repatha which you will inject once every 2 weeks  I will complete the prior authorization for you and contact you when it is approved  Once you start the medication we will recheck your cholesterol levels in 2-3 months  I recommend going to [email or web address] and downloading the savings card  Please call with any questions  Laural Golden, PharmD, BCACP, CDCES, CPP [address] [place], [place], [address] Phone: [phone], Fax: [phone]

## 2021-12-25 NOTE — Progress Notes (Signed)
Patient ID: Edward Mendoza                 DOB: 1970/08/31                    MRN: [id number]     HPI: COHAN STIPES is a 51 y.o. male patient referred to lipid clinic by Dr [name]. PMH is significant for cardiac arrest, STEMI, A fib, HFrEF, VF, multiple strokes, and HLD.   Patient intolerant to Zetia and has myalgias with rosuvastatin but he has continued to take.  Patient presents today in good spirits. 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  Reports he has been slipping with his diet recently. Has started eating more fast food and drinking sugary beverages.  Would like to stop his citalopram.   Current Medications:  Rosuvastatin 40mg  daily  Intolerances:  Ezetimibe 10mg   Risk Factors:  CAD MI CHF Hx of strokes  LDL goal: <55  Labs: TC 162, Trigs 181, HDL 44, LDL 87 ( 11/16/21)  Past Medical History:  Diagnosis Date   Acute ST elevation myocardial infarction (STEMI) involving left anterior descending coronary artery (HCC)    Presented as VF arrest -> 45 minutes CPR with ROSC => Ant STEMI: Cath = 95% thrombotic/hazy prox-mid LAD -> DES PCI. ->  Severe Ischemic Cardiomyopathy (EF improved from 15% to 35-40%).   Cardiac arrest with ventricular fibrillation (HCC) 07/11/2019   Chronic combined systolic and diastolic CHF, NYHA class 1 (HCC) 07/2019   Initial EF of 50% improved to 35-40%.  Class I-II CHF   History of multiple strokes 07/22/2019   Multiple CVAs noted on MRI post CARDIAC ARREST-prolonged CPR (innumerable punctate acute infarctions scattered throughout the cerebellum and s bilateral cerebellar hemispheres most consistent with micro emboli) no residual symptoms.   Hyperlipidemia with target LDL less than 70 07/22/2019   Ischemic cardiomyopathy -> EF ~35-40% by CMRI 07/11/2019   following anterior MI and cardiac arrest-initial EF 15% improved to 35-40% 3 months post PCI.   Paroxysmal atrial fibrillation/a atrial flutter ->   in setting of post MI/cardiac arrest 07/18/2019   Had PAF-flutter following rewarming from [employer] after cardiac arrest: TEE DCCV on 07/21/2019   Single vessel coronary disease 07/11/2019   Anterior STEMI complicated by VF arrest.  1V CAD - 95-99% p-mLAD => DES PCI: Resolute Onyx DES 3.5 x 18 (post-dilated to 4.0 mm)   Smoker 07/13/2019    Current Outpatient Medications on File Prior to Visit  Medication Sig Dispense Refill   apixaban (ELIQUIS) 5 MG TABS tablet Take 1 tablet (5 mg total) by mouth 2 (two) times daily. 180 tablet 1   carvedilol (COREG) 3.125 MG tablet Take 1 tablet (3.125 mg total) by mouth 2 (two) times daily. 180 tablet 3   citalopram (CELEXA) 10 MG tablet Take 1 tablet (10 mg total) by mouth daily. 90 tablet 3   ezetimibe (ZETIA) 10 MG tablet Take 1 tablet (10 mg total) by mouth daily. 90 tablet 3   FARXIGA 10 MG TABS tablet TAKE ONE TABLET (10MG  TOTAL) BY MOUTH DAILY BEFORE BREAKFAST 90 tablet 3   furosemide (LASIX) 20 MG tablet Take 1 tablet (20 mg total) by mouth daily as needed. Use as needed for lower leg swelling 90 tablet 3   losartan (COZAAR) 25 MG tablet Take 1 tablet (25 mg total) by mouth at bedtime. 90 tablet 3   nitroGLYCERIN (NITROSTAT) 0.4 MG SL tablet DISSOLVE  1 TABLET UNDER TONGUE EVERY 5 MINUTES AS NEEDED FOR CHEST PAIN 25 tablet 11   pantoprazole (PROTONIX) 40 MG tablet Take 1 tablet (40 mg total) by mouth daily. 90 tablet 3   rosuvastatin (CRESTOR) 40 MG tablet Take 1 tablet (40 mg total) by mouth daily. 90 tablet 3   spironolactone (ALDACTONE) 25 MG tablet Take 1 tablet (25 mg total) by mouth daily. 90 tablet 3   No current facility-administered medications on file prior to visit.    No Known Allergies  Assessment/Plan:  1. Hyperlipidemia - Patient's most recent LDL 87 which is above goal of <55.  Aggressive goal selected due to history of MI and CVA.  Did not have significant LDL reduction on rosuvastatin which is also causing side effects.  However he is willing to continue to take it and deal with muscle pain.   Recommended adding on PCSK9i to lower LDL and reduce further risk of MI. Using demo pen, educated patient on mechanism of action, storage, site selection, administration and possible adverse effects. Will complete PA and contact patint when approved. Showed patient how to download copay card.  Recommended decreasing sugary drinks and fast foods as it may be the cause of his elevating triglycerides.  Gave patient advice on how to taper off citalopram if he did not think he needed it but he prefers to continue until he follows up with Dr [name].  Laural Golden, PharmD, BCACP, CDCES, CPP [address] [place], [place], [address] Phone: [phone], Fax: [phone]

## 2022-01-22 ENCOUNTER — Telehealth: Payer: Self-pay | Admitting: Pharmacist

## 2022-01-22 ENCOUNTER — Encounter: Payer: Self-pay | Admitting: Pharmacist

## 2022-01-22 DIAGNOSIS — E785 Hyperlipidemia, unspecified: Secondary | ICD-10-CM

## 2022-01-22 DIAGNOSIS — I2102 ST elevation (STEMI) myocardial infarction involving left anterior descending coronary artery: Secondary | ICD-10-CM

## 2022-01-22 DIAGNOSIS — I2510 Atherosclerotic heart disease of native coronary artery without angina pectoris: Secondary | ICD-10-CM

## 2022-01-22 MED ORDER — REPATHA SURECLICK 140 MG/ML ~~LOC~~ SOAJ: 1.0000 mL | SUBCUTANEOUS | 3 refills | Status: DC

## 2022-01-22 NOTE — Addendum Note (Signed)
Addended by: Rollen Sox on: 01/22/2022 04:08 PM   Modules accepted: Orders

## 2022-01-22 NOTE — Telephone Encounter (Signed)
PA for Repatha submitted.  Key: [id number]

## 2022-01-22 NOTE — Addendum Note (Signed)
Addended by: Rollen Sox on: 01/22/2022 02:03 PM   Modules accepted: Orders

## 2022-02-26 ENCOUNTER — Encounter (HOSPITAL_COMMUNITY): Payer: Self-pay | Admitting: Internal Medicine

## 2022-03-16 ENCOUNTER — Ambulatory Visit: Payer: BC Managed Care – PPO | Attending: Cardiology | Admitting: Cardiology

## 2022-03-16 ENCOUNTER — Encounter: Payer: Self-pay | Admitting: Cardiology

## 2022-03-16 VITALS — BP 98/78 | HR 71 | Ht 72.0 in | Wt 244.2 lb

## 2022-03-16 DIAGNOSIS — I2510 Atherosclerotic heart disease of native coronary artery without angina pectoris: Secondary | ICD-10-CM | POA: Diagnosis not present

## 2022-03-16 DIAGNOSIS — I2102 ST elevation (STEMI) myocardial infarction involving left anterior descending coronary artery: Secondary | ICD-10-CM

## 2022-03-16 DIAGNOSIS — I480 Paroxysmal atrial fibrillation: Secondary | ICD-10-CM

## 2022-03-16 DIAGNOSIS — I5020 Unspecified systolic (congestive) heart failure: Secondary | ICD-10-CM | POA: Diagnosis not present

## 2022-03-16 DIAGNOSIS — Z9861 Coronary angioplasty status: Secondary | ICD-10-CM

## 2022-03-16 DIAGNOSIS — I255 Ischemic cardiomyopathy: Secondary | ICD-10-CM | POA: Diagnosis not present

## 2022-03-16 DIAGNOSIS — E785 Hyperlipidemia, unspecified: Secondary | ICD-10-CM

## 2022-03-16 NOTE — Progress Notes (Signed)
Primary Care Provider: Lise Auer, MD [facility] Cardiologist: Bryan Lemma, MD Electrophysiologist: None Adv. CHF Cardiologist: Dr. Gala Romney  Clinic Note: Chief Complaint  Patient presents with   Follow-up    As usual doing well.  Just started Repatha.   Coronary Artery Disease    No active angina   Cardiomyopathy    No real heart failure symptoms.    ===================================  ASSESSMENT/PLAN   Problem List Items Addressed This Visit       Cardiology Problems   ST elevation myocardial infarction (STEMI) involving left anterior descending (LAD) coronary artery in recovery phase (HCC) (Chronic)    Presented to VF arrest with prolonged CPR over 45 minutes before ROSC. Complicated by reduced EF and shock.  Unfortunately his EF did not fully bounce back although appears improved from 15% to 35 to 40% on cardiac MRI.  He had LAD PCI with DES recently evaluated with follow-up echo.      Relevant Orders   EKG 12-Lead   Lipid panel   Comprehensive metabolic panel   CBC   Ischemic cardiomyopathy -> following anterior MI and cardiac arrest-initial EF 15% improved to 35-40% 3 months post PCI. - Primary (Chronic)    Medications optimized by advanced heart.  Pain as noted, on carvedilol, losartan, prolactin and Farxiga with PRN Lasix.  BP not high enough to tolerate Entresto apparently.      Relevant Orders   EKG 12-Lead   Comprehensive metabolic panel   CBC   HFrEF (heart failure with reduced ejection fraction) (HCC)-NYHA Class I symptoms, EF 39% by CMR (Chronic)    EF looked worse on his most recent echo.  Has been seen by documented.  Somewhat along the line of the Entresto was dropped and he is on losartan which based on the fact his blood pressures below today is reasonable. He has NYHA class I symptoms despite the EF being severely reduced.  Questions asked if his EF is still down would be consider ICD referral.  He is currently on max tolerated  doses of carvedilol to 3.25 mg twice daily, and losartan at 25 mg daily along with spironolactone 25 mg daily. He does not take extended the Lasix but is just using a PRN and not using very frequently. He is on [place] which is probably why he does not eat as much with diuretic.  Overall stable.  Will defer management to Dr. Gala Romney.      Relevant Orders   EKG 12-Lead   Comprehensive metabolic panel   CBC   CAD S/P DES PCI of LAD (Chronic)    He is now over 2 and half years out from his MI with single-vessel CAD-LAD PCI.  Unfortunately despite presentation and he has significant anterior wall his with severely reduced EF.  He is still on Plavix plus Eliquis (having him converted from Brilinta to Plavix for the use of Eliquis. Despite the fact that his most recent echo showed that his EF is down, he is feeling fine and no active angina or CHF symptoms.  Plan: Continue stable dose of carvedilol 3.125 mg twice daily and low-dose losartan (having converted from Entresto to losartan 20 mg daily). Continue rosuvastatin along with Repatha.  Continue co-Q10. Continue Farxiga, along with spironolactone.      Relevant Orders   EKG 12-Lead   Lipid panel   Comprehensive metabolic panel   CBC   Paroxysmal atrial fibrillation/a atrial flutter ->  in setting of post MI/cardiac arrest (Chronic)  This pleasant 72 has not had any breakthrough spells of A-fib since his MI.  He remains on Eliquis because of his reduced EF and PAF, but with the potential of stopping Eliquis.  Continue monotherapy with Plavix.  However he also was part we could potentially consider stopping Plavix with Eliquis therapy.      Relevant Orders   EKG 12-Lead   Comprehensive metabolic panel   CBC   Hyperlipidemia with target LDL less than 70 (Chronic)    Started on Repatha in the interim since his last visit.  He is now done his second shot and tolerated well.  His third shot is later this week.  Patient doing  relatively well he seems to be for labs to be checked in December.  He will continue rosuvastatin for now but we may need to reduce the dose if his lipids are under better control.    Check labs in January.      Relevant Orders   Lipid panel   Comprehensive metabolic panel    ===================================  HPI:    MICAJAH OAS is a 51 y.o. male who is being seen today for 7 Month follow-up at the request of Mateo Flow, MD.  CARDIAC HISTORY 07/11/19 presented with Acute Anterior STEMI complicated by witnessed VF = 40-45 min ACLS before ROSC  LHC - 99% prox LAD => DES PCI; Echo EF ~15%, Global HK; CVP 15 mmHg; Borderline C-Shock -- on low dose Levophed.  Echo 3/13: EF 20-25%, Severe HK of Entire Anterioseptal/anterior/ apical wall. Mod HK of mid-apical Inferior wall. 07/21/19: Post PCI Atrial Flutter => TEE DCCV; converted to Plavix/Eliquis EF improved to 30-35% post DCCV. Event monitor - no recurrent Aflutter/Fib.  Echo 10/24/19:  EF 35-40% - Mid-Apical Anteroseptal / Apical Akinesis. Normal RAP. CMRI 01/11/20:  LVEF ~39%. Akinesis of basal-mid inferoseptal, anteroseptal & apical anterior-septal-inferior walls as well as tru apex.  Paradoxical septal WM. 75-100% late enhancement - FIXED INFARCT. Normal RV function. Mod LA dilation. Mild-mod MR & TR.  Echo 09/18/2020: EF ~30-35% (~ slightly worse than 10/2019). 14 D Zio: 3 atrial runs 5-9 beats. Rare PACs & PVCs.  Symptomatic PVCs.   LATRON BINDA was last seen on July 30, 2021 by Dr. Haroldine Laws of advanced heart failure service.  Plan was to follow-up in Jan => BP too low for Entresto, on losartan 25 mg daily along with spironolactone 25 mg daily and carvedilol 3.25 mg twice daily.  Farxiga 10 mg.  Plan was to recheck echo.  No breakthrough episodes A-fib or atrial flutter  Recent Hospitalizations: None  He was seen by Rollen Sox, [place] on 12/26/2018 for lipid management.  Recommended Repatha.  Reviewed  CV studies:     The following studies were reviewed today: (if available, images/films reviewed: From Epic Chart or Care Everywhere) TTE 08/13/2021.:  EF 25 to 30%.  Severely decreased function.  "No RWMA".  Moderate dilated LV.  Moderate RA dilation.  AOV calcification/sclerosis IRIS.   Interval History:   ALPHONSO MAYORAL presents today overall doing remarkably well.  Unusual finding on the echocardiogram showing EF decreased down to 25-30%.  Not much was changed based on those results.    Somehow his Delene Loll was stopped between seeing me several days prior to seeing Dr. Shari Prows in March of this year.  Perhaps he was not actually taking Entresto but it was listed last saw him in March because Dr. Haroldine Laws started the losartan shortly after that visit.   His  blood pressures are still little bit low but he is not having any symptoms of lightheadedness.   He really denies any cardiac symptoms just some mild cramping and occasional positional dizziness. He had 3 spells of heart racing but no real SVT symptoms.  He had a couple episodes intermittent tach but nothing long-lasting to usually break with vagal maneuvers.  He remains very active: Walks 5-7 miles most days.  Lots of heavy lifting - "slinging several hundred pounds of garbage/ day.  Says he also does biking for 5 miles a couple times a week along with resistance training.  Despite all this, his weight is going up which is unusual.  Tolerating his statin plus Repatha.  Did not do well with Zetia. Anxiety has been controlled with Celexa. If needing to use Protonix every day-has been doing a PRN. No bleeding issues on Eliquis apixaban been and Plavix.  CV Review of Symptoms (Summary): no chest pain or dyspnea on exertion positive for - palpitations and occasional cramps, weight gain negative for - edema, irregular heartbeat, orthopnea, paroxysmal nocturnal dyspnea, rapid heart rate, shortness of breath, or syncope or near syncope, TIA/amaurosis fugax or  claudication.  REVIEWED OF SYSTEMS   Review of Systems  Constitutional:  Negative for malaise/fatigue and weight loss (wgt up & down).  HENT:  Negative for congestion.   Respiratory:  Negative for cough and shortness of breath.   Gastrointestinal:  Negative for abdominal pain, blood in stool and melena.  Genitourinary:  Negative for frequency and hematuria.  Musculoskeletal:  Positive for myalgias (His leg muscle ache at the end of the day.  Also has intermittent cramping.). Negative for joint pain.  Neurological:  Negative for dizziness and focal weakness.  Psychiatric/Behavioral:  Negative for depression and memory loss. The patient is not nervous/anxious and does not have insomnia.     I have reviewed and (if needed) personally updated the patient's problem list, medications, allergies, past medical and surgical history, social and family history.   PAST MEDICAL HISTORY   Past Medical History:  Diagnosis Date   Acute ST elevation myocardial infarction (STEMI) involving left anterior descending coronary artery (HCC)    Presented as VF arrest -> 45 minutes CPR with ROSC => Ant STEMI: Cath = 95% thrombotic/hazy prox-mid LAD -> DES PCI. ->  Severe Ischemic Cardiomyopathy (EF improved from 15% to 35-40%).   Cardiac arrest with ventricular fibrillation (HCC) 07/11/2019   Chronic combined systolic and diastolic CHF, NYHA class 1 (HCC) 07/2019   Initial EF of 50% improved to 35-40%.  Class I-II CHF   History of multiple strokes 07/22/2019   Multiple CVAs noted on MRI post CARDIAC ARREST-prolonged CPR (innumerable punctate acute infarctions scattered throughout the cerebellum and s bilateral cerebellar hemispheres most consistent with micro emboli) no residual symptoms.   Hyperlipidemia with target LDL less than 70 07/22/2019   Ischemic cardiomyopathy -> EF ~35-40% by CMRI 07/11/2019   following anterior MI and cardiac arrest-initial EF 15% improved to 35-40% 3 months post PCI.   Paroxysmal  atrial fibrillation/a atrial flutter ->  in setting of post MI/cardiac arrest 07/18/2019   Had PAF-flutter following rewarming from [place] after cardiac arrest: TEE DCCV on 07/21/2019   Single vessel coronary disease 07/11/2019   Anterior STEMI complicated by VF arrest.  1V CAD - 95-99% p-mLAD => DES PCI: Resolute Onyx DES 3.5 x 18 (post-dilated to 4.0 mm)   Smoker 07/13/2019    PAST SURGICAL HISTORY   Past Surgical History:  Procedure  Laterality Date   ARTERIAL LINE INSERTION N/A 07/11/2019   Procedure: ARTERIAL LINE INSERTION;  Surgeon: Leonie Man, MD;  Location: [facility];  Service: Cardiovascular;  Laterality: N/A;   CARDIAC MRI  01/11/2020   LVEF ~39%. Akinesis of basal-mid inferoseptal, anteroseptal & apical anterior-septal-inferior walls as well as tru apex.  Paradoxical septal WM. 75-100% late enhancement - FIXED INFARCT. Normal RV function. Mod LA dilation. Mild-mod MR & TR.   CARDIOVERSION N/A 07/21/2019   Procedure: CARDIOVERSION;  Surgeon: Jolaine Artist, MD;  Location[facility] ENDOSCOPY;  Service: Cardiovascular;; TTE - DCCV: Atrial flutter-restored sinus rhythm   CORONARY STENT INTERVENTION N/A 07/11/2019   Procedure: CORONARY STENT INTERVENTION;  Surgeon: Leonie Man, MD;  Location: [facility];  Service: Cardiovascular;;  proximal-mid LAD 95% hazy lesion (DES PCI Resolute Onyx 3.5 mm 18 mm-at 4.0 mm),   CORONARY/GRAFT ACUTE MI REVASCULARIZATION N/A 07/11/2019   Procedure: Coronary/Graft Acute MI Revascularization;  Surgeon: Leonie Man, MD;  [facility] INVASIVE [facility];  DES PCI of p-m LAD 95-99%.   RIGHT/LEFT HEART CATH AND CORONARY ANGIOGRAPHY N/A 07/11/2019   Procedure: RIGHT/LEFT HEART CATH AND CORONARY ANGIOGRAPHY;  Surgeon: Leonie Man, MD;  Location: [facility] INVASIVE CV LAB:: Ant STEMI/Cardiac Arrest:  p-mLAD 95-99% thrombotic stenosis --> DES PCI. EF ~35%. Normal RHC Pressures. PCWP ~20 mmHg with LVEDP ~18 mmHg.  CO-CI 4.77 - 2.04 (no  MCS placed)    TEE WITHOUT CARDIOVERSION N/A 07/21/2019   Procedure: TRANSESOPHAGEAL ECHOCARDIOGRAM (TEE) W/ CARDIOVERSION;  Surgeon: Jolaine Artist, MD;  Location: [facility] ENDOSCOPY;;  EF 30 to 35%.  Mid-apical anterior and apical hypo to akinesis.  No LAA. ->  Successful DCCV of Atrial Flutter.   TRANSTHORACIC ECHOCARDIOGRAM  07/2019   a) Initial Post PCI-postarrest: EF~15%.  Severely reduced with global HK.  Moderate LVH.  Mildly dilated IVC.; b) 07/14/2019: EF 20 to 25%.  Entire anteroseptal, anterior and apical wall severe hypokinesis with moderate HK of mid-anterior inferior wall.  (Improved EF).   TRANSTHORACIC ECHOCARDIOGRAM  10/24/2019   EF 35 to 40%.  Moderately dilated LV indeterminate filling pressures.  Akinetic mid anteroseptal wall as well as apical anterior and apical septal wall.  Otherwise normal valves and remaining chambers are normal.  History of PFO but no shunting.   TRANSTHORACIC ECHOCARDIOGRAM  08/2020   EF ~30-35% (~ slightly worse than 10/2019).   Cardiac Cath 07/11/2019: Anterior STEMI with prolonged VF arrest -> proximal-mid LAD 95% hazy lesion (DES PCI Resolute Onyx 3.5 mm 18 mm-at 4.0 mm), otherwise minimal disease.  EF estimated 35 to 45%-likely hyperdynamic on vasopressors.  Relatively normal right heart cath pressures _> PCWP 20 mmHg.  LVEDP 18 mmHg. Cardiac output-index by Fick: 4.77-2.04.      Immunization History  Administered Date(s) Administered   PFIZER(Purple Top)SARS-COV-2 Vaccination 11/08/2019    MEDICATIONS/ALLERGIES   Current Meds  Medication Sig   apixaban (ELIQUIS) 5 MG TABS tablet Take 1 tablet (5 mg total) by mouth 2 (two) times daily.   carvedilol (COREG) 3.125 MG tablet Take 1 tablet (3.125 mg total) by mouth 2 (two) times daily.   citalopram (CELEXA) 10 MG tablet Take 1 tablet (10 mg total) by mouth daily.   clopidogrel (PLAVIX) 75 MG tablet Take 75 mg by mouth daily.   Coenzyme Q-10 200 MG CAPS Take 1 capsule by mouth daily at 6 (six) AM.    Evolocumab (REPATHA SURECLICK) [id number] MG/ML SOAJ Inject 1 mL into the skin every 14 (  fourteen) days.   FARXIGA 10 MG TABS tablet TAKE ONE TABLET (10MG  TOTAL) BY MOUTH DAILY BEFORE BREAKFAST   losartan (COZAAR) 25 MG tablet Take 1 tablet (25 mg total) by mouth at bedtime.   nitroGLYCERIN (NITROSTAT) 0.4 MG SL tablet DISSOLVE 1 TABLET UNDER TONGUE EVERY 5 MINUTES AS NEEDED FOR CHEST PAIN   pantoprazole (PROTONIX) 40 MG tablet Take 1 tablet (40 mg total) by mouth daily.   rosuvastatin (CRESTOR) 40 MG tablet Take 1 tablet (40 mg total) by mouth daily.   spironolactone (ALDACTONE) 25 MG tablet Take 1 tablet (25 mg total) by mouth daily.    Allergies  Allergen Reactions   Ezetimibe Other (See Comments)    Arms locking up    SOCIAL HISTORY/FAMILY HISTORY   Reviewed in Epic:   Social History   Tobacco Use   Smoking status: Former    Packs/day: 1.00    Years: 20.00    Total pack years: 20.00    Types: Cigarettes    Start date: 2000    Quit date: 07/11/2019    Years since quitting: 2.7   Smokeless tobacco: Never  Substance Use Topics   Alcohol use: Not Currently   Drug use: Not Currently   Social History   Social History Narrative   Not on file   Family History  Family history unknown: Yes    OBJCTIVE -PE, EKG, labs   Wt Readings from Last 3 Encounters:  03/16/22 244 lb 3.2 oz (110.8 kg)  07/30/21 236 lb 9.6 oz (107.3 kg)  07/17/21 240 lb 12.8 oz (109.2 kg)    Physical Exam: BP 98/78 (BP Location: Left Arm, Patient Position: Sitting, Cuff Size: Large)   Pulse 71   Ht 6' (1.829 m)   Wt 244 lb 3.2 oz (110.8 kg)   SpO2 95%   BMI 33.12 kg/m  Physical Exam Vitals reviewed.  Constitutional:      General: He is not in acute distress.    Appearance: Normal appearance. He is obese. He is not ill-appearing (Well-nourished, well-groomed.) or toxic-appearing.  HENT:     Head: Normocephalic and atraumatic.  Neck:     Vascular: No carotid bruit or JVD.  Cardiovascular:      Rate and Rhythm: Normal rate and regular rhythm. Occasional Extrasystoles are present.    Chest Wall: PMI is not displaced (Borderline laterally displaced).     Pulses: Normal pulses.     Heart sounds: S1 normal and S2 normal. Heart sounds not distant. No murmur heard.    No friction rub. Gallop present. S4 sounds present.  Pulmonary:     Effort: Pulmonary effort is normal. No respiratory distress.     Breath sounds: Normal breath sounds. No wheezing, rhonchi or rales.  Chest:     Chest wall: Tenderness present.  Abdominal:     General: Bowel sounds are normal. There is no distension.     Palpations: Abdomen is soft.     Tenderness: There is no abdominal tenderness. There is no guarding.  Musculoskeletal:     Cervical back: Normal range of motion and neck supple.  Skin:    General: Skin is warm and dry.  Neurological:     General: No focal deficit present.     Mental Status: He is alert and oriented to person, place, and time.     Gait: Gait normal.  Psychiatric:        Mood and Affect: Mood normal.        Behavior: Behavior  normal.        Thought Content: Thought content normal.        Judgment: Judgment normal.     Adult ECG Report  Rate: 71 ;  Rhythm: normal sinus rhythm, premature ventricular contractions (PVC), and anteroseptal MI, age-indeterminate.  Borderline QRS prolongation. ;   Narrative Interpretation: Stable  Recent Labs: Reviewed Lab Results  Component Value Date   CHOL 162 11/16/2021   HDL 44 11/16/2021   LDLCALC 87 11/16/2021   TRIG 181 (H) 11/16/2021   CHOLHDL 3.7 11/16/2021   Lab Results  Component Value Date   CREATININE 1.13 07/21/2021   BUN 17 07/21/2021   NA 138 07/21/2021   K 4.4 07/21/2021   CL 102 07/21/2021   CO2 23 07/21/2021      Latest Ref Rng & Units 08/07/2020   11:45 AM 05/27/2020    9:45 PM 10/24/2019    3:59 PM  CBC  WBC 4.0 - 10.5 K/uL 6.6  8.6  6.2   Hemoglobin 13.0 - 17.0 g/dL 77.1  16.5  79.0   Hematocrit 39.0 - 52.0 % 46.7   43.5  43.7   Platelets 150 - 400 K/uL 275  262  238     Lab Results  Component Value Date   HGBA1C 5.4 07/11/2019   Lab Results  Component Value Date   TSH 3.442 10/24/2019    ================================================== I spent a total of 25 minutes with the patient spent in direct patient consultation.  Additional time spent with chart review  / charting (studies, outside notes, etc): 18 min Total Time: 43 min  Current medicines are reviewed at length with the patient today.  (+/- concerns) n/a  Notice: This dictation was prepared with Dragon dictation along with smart phrase technology. Any transcriptional errors that result from this process are unintentional and may not be corrected upon review.   Studies Ordered:  Orders Placed This Encounter  Procedures   Lipid panel   Comprehensive metabolic panel   CBC   EKG 12-Lead   No orders of the defined types were placed in this encounter.   Patient Instructions / Medication Changes & Studies & Tests Ordered   Patient Instructions  Medication Instructions:   No changes *If you need a refill on your cardiac medications before your next appointment, please call your pharmacy*   Lab Work:fasting CBC CMP LIPID If you have labs (blood work) drawn today and your tests are completely normal, you will receive your results only by: MyChart Message (if you have MyChart) OR A paper copy in the mail If you have any lab test that is abnormal or we need to change your treatment, we will call you to review the results.   Testing/Procedures: notneeded   Follow-Up: At [facility], you and your health needs are our priority.  As part of our continuing mission to provide you with exceptional heart care, we have created designated Provider Care Teams.  These Care Teams include your primary Cardiologist (physician) and Advanced Practice Providers (APPs -  Physician Assistants and Nurse Practitioners) who all work together  to provide you with the care you need, when you need it.     Your next appointment:   12 month(s)  The format for your next appointment:   In Person  Provider:   Bryan Lemma, MD        Marykay Lex, MD, MS Bryan Lemma, M.D., M.S. Interventional Cardiologist  [facility]  Pager # [phone] Phone #  [address] [place], [place] [address]   Thank you for choosing [facility] [place]!!

## 2022-03-16 NOTE — Patient Instructions (Signed)
Medication Instructions:   No changes *If you need a refill on your cardiac medications before your next appointment, please call your pharmacy*   Lab Work:fasting CBC CMP LIPID If you have labs (blood work) drawn today and your tests are completely normal, you will receive your results only by: MyChart Message (if you have MyChart) OR A paper copy in the mail If you have any lab test that is abnormal or we need to change your treatment, we will call you to review the results.   Testing/Procedures: notneeded   Follow-Up: At [facility], you and your health needs are our priority.  As part of our continuing mission to provide you with exceptional heart care, we have created designated Provider Care Teams.  These Care Teams include your primary Cardiologist (physician) and Advanced Practice Providers (APPs -  Physician Assistants and Nurse Practitioners) who all work together to provide you with the care you need, when you need it.     Your next appointment:   12 month(s)  The format for your next appointment:   In Person  Provider:   Bryan Lemma, MD

## 2022-04-11 ENCOUNTER — Encounter: Payer: Self-pay | Admitting: Cardiology

## 2022-04-11 NOTE — Assessment & Plan Note (Signed)
Medications optimized by advanced heart.  Pain as noted, on carvedilol, losartan, prolactin and Farxiga with PRN Lasix.  BP not high enough to tolerate Entresto apparently.

## 2022-04-11 NOTE — Assessment & Plan Note (Signed)
This pleasant 72 has not had any breakthrough spells of A-fib since his MI.  He remains on Eliquis because of his reduced EF and PAF, but with the potential of stopping Eliquis.  Continue monotherapy with Plavix.  However he also was part we could potentially consider stopping Plavix with Eliquis therapy.

## 2022-04-11 NOTE — Assessment & Plan Note (Signed)
Presented to VF arrest with prolonged CPR over 45 minutes before ROSC. Complicated by reduced EF and shock.  Unfortunately his EF did not fully bounce back although appears improved from 15% to 35 to 40% on cardiac MRI.  He had LAD PCI with DES recently evaluated with follow-up echo.

## 2022-04-11 NOTE — Assessment & Plan Note (Signed)
EF looked worse on his most recent echo.  Has been seen by documented.  Somewhat along the line of the Entresto was dropped and he is on losartan which based on the fact his blood pressures below today is reasonable. He has NYHA class I symptoms despite the EF being severely reduced.  Questions asked if his EF is still down would be consider ICD referral.  He is currently on max tolerated doses of carvedilol to 3.25 mg twice daily, and losartan at 25 mg daily along with spironolactone 25 mg daily. He does not take extended the Lasix but is just using a PRN and not using very frequently. He is on [place] which is probably why he does not eat as much with diuretic.  Overall stable.  Will defer management to Dr. Gala Romney.

## 2022-04-11 NOTE — Assessment & Plan Note (Signed)
He is now over 2 and half years out from his MI with single-vessel CAD-LAD PCI.  Unfortunately despite presentation and he has significant anterior wall his with severely reduced EF.  He is still on Plavix plus Eliquis (having him converted from Brilinta to Plavix for the use of Eliquis. Despite the fact that his most recent echo showed that his EF is down, he is feeling fine and no active angina or CHF symptoms.  Plan: Continue stable dose of carvedilol 3.125 mg twice daily and low-dose losartan (having converted from Entresto to losartan 20 mg daily). Continue rosuvastatin along with Repatha.  Continue co-Q10. Continue Farxiga, along with spironolactone.

## 2022-04-11 NOTE — Assessment & Plan Note (Signed)
Started on Repatha in the interim since his last visit.  He is now done his second shot and tolerated well.  His third shot is later this week.  Patient doing relatively well he seems to be for labs to be checked in December.  He will continue rosuvastatin for now but we may need to reduce the dose if his lipids are under better control.    Check labs in January.

## 2022-05-11 ENCOUNTER — Telehealth: Payer: Self-pay | Admitting: Cardiology

## 2022-05-11 ENCOUNTER — Other Ambulatory Visit: Payer: Self-pay | Admitting: *Deleted

## 2022-05-11 DIAGNOSIS — I480 Paroxysmal atrial fibrillation: Secondary | ICD-10-CM

## 2022-05-11 MED ORDER — APIXABAN 5 MG PO TABS: 5.0000 mg | ORAL_TABLET | Freq: Two times a day (BID) | ORAL | 1 refills | Status: DC

## 2022-05-11 MED ORDER — APIXABAN 5 MG PO TABS: 5.0000 mg | ORAL_TABLET | Freq: Two times a day (BID) | ORAL | 5 refills | Status: DC

## 2022-05-11 NOTE — Telephone Encounter (Signed)
*  STAT* If patient is at the pharmacy, call can be transferred to refill team.   1. Which medications need to be refilled? (please list name of each medication and dose if known)   apixaban (ELIQUIS) 5 MG TABS tablet   2. Which pharmacy/location (including street and city if local pharmacy) is medication to be sent to?  [place], [place]   3. Do they need a 30 day or 90 day supply?  30 day  Patient stated he has been out of this medication for a week.

## 2022-05-11 NOTE — Telephone Encounter (Signed)
Prescription refill request for Eliquis received. Indication: Afib  Last office visit: 03/16/22 Ellyn Hack)  Scr: 1.13 (07/21/21)  Age: 52 Weight: 110.8kg  Appropriate dose and refill sent to requested pharmacy.

## 2022-05-11 NOTE — Telephone Encounter (Signed)
Eliquis 5mg  refill request received. Patient is 53 years old, weight-110.8kg, Crea-1.13 on 07/21/2021, Diagnosis-Afib, and last seen by Dr. Ellyn Hack on 03/16/2022. Dose is appropriate based on dosing criteria. Will send in refill to requested pharmacy.

## 2022-05-12 ENCOUNTER — Other Ambulatory Visit: Payer: Self-pay

## 2022-05-12 ENCOUNTER — Encounter: Payer: Self-pay | Admitting: Cardiology

## 2022-05-12 ENCOUNTER — Emergency Department (HOSPITAL_COMMUNITY)
Admission: EM | Admit: 2022-05-12 | Discharge: 2022-05-12 | Disposition: A | Discharge status: Home / Self Care | Payer: BC Managed Care – PPO | Attending: Emergency Medicine | Admitting: Emergency Medicine

## 2022-05-12 ENCOUNTER — Encounter (HOSPITAL_COMMUNITY): Payer: Self-pay | Admitting: Internal Medicine

## 2022-05-12 DIAGNOSIS — I2510 Atherosclerotic heart disease of native coronary artery without angina pectoris: Secondary | ICD-10-CM | POA: Diagnosis not present

## 2022-05-12 DIAGNOSIS — R42 Dizziness and giddiness: Secondary | ICD-10-CM

## 2022-05-12 DIAGNOSIS — I5020 Unspecified systolic (congestive) heart failure: Secondary | ICD-10-CM | POA: Diagnosis not present

## 2022-05-12 DIAGNOSIS — Z7901 Long term (current) use of anticoagulants: Secondary | ICD-10-CM | POA: Diagnosis not present

## 2022-05-12 DIAGNOSIS — Z7902 Long term (current) use of antithrombotics/antiplatelets: Secondary | ICD-10-CM | POA: Diagnosis not present

## 2022-05-12 LAB — CBC
HCT: 47.9 % (ref 39.0–52.0)
Hemoglobin: 15.9 g/dL (ref 13.0–17.0)
MCH: 30.7 pg (ref 26.0–34.0)
MCHC: 33.2 g/dL (ref 30.0–36.0)
MCV: 92.5 fL (ref 80.0–100.0)
Platelets: 263 10*3/uL (ref 150–400)
RBC: 5.18 MIL/uL (ref 4.22–5.81)
RDW: 11.9 % (ref 11.5–15.5)
WBC: 8.6 10*3/uL (ref 4.0–10.5)
nRBC: 0 % (ref 0.0–0.2)

## 2022-05-12 LAB — TROPONIN I (HIGH SENSITIVITY)
Troponin I (High Sensitivity): 4 ng/L (ref <18)
Troponin I (High Sensitivity): 4 ng/L (ref <18)

## 2022-05-12 LAB — BASIC METABOLIC PANEL
Anion gap: 9 (ref 5–15)
BUN: 17 mg/dL (ref 6–20)
CO2: 24 mmol/L (ref 22–32)
Calcium: 9.3 mg/dL (ref 8.9–10.3)
Chloride: 102 mmol/L (ref 98–111)
Creatinine, Ser: 1.15 mg/dL (ref 0.61–1.24)
GFR, Estimated: >60 mL/min (ref >60)
Glucose, Bld: 117 mg/dL — ABNORMAL HIGH (ref 70–99)
Potassium: 3.8 mmol/L (ref 3.5–5.1)
Sodium: 135 mmol/L (ref 135–145)

## 2022-05-12 NOTE — ED Provider Notes (Signed)
[facility] EMERGENCY DEPARTMENT Provider Note   CSN: [id number] Arrival date & time:        History  Chief Complaint  Patient presents with   Chest Pain    Presented to UC for feeling unwell not "feeling right". EKG at [facility] showed abnormalities suspected from previous MI. UC dispatched EMS for CP and abnormal EKG. Pt A&Ox4. Denies CP, SOB. VSS.     Edward Mendoza is a 52 y.o. male, h/o MI (2021) with 1 Stent, HFrEF, CAD, CVA, presented with 1 day history of exertional lightheadedness.  States while he was at work and exerting himself he gets lightheaded and needs to sit down to resolve symptoms and he is able to continue.  Patient denied any diet changes and has been staying hydrated.  Patient states blood pressure has been controlled at home.  Denied chest pain, shortness of breath, dyspnea on exertion, abdomen pain, headaches, fevers, contacts at home, nausea, vomiting, diarrhea, changes in blood pressure medications, lower limb edema, vision changes, changes in sensation/motor skills, hearing changes, tinnitus.  Last ECHO: 08/13/2021 EF 25-30%  Home Medications Prior to Admission medications   Medication Sig Start Date End Date Taking? Authorizing Provider  apixaban (ELIQUIS) 5 MG TABS tablet Take 1 tablet (5 mg total) by mouth 2 (two) times daily. 05/11/22   Leonie Man, MD  carvedilol (COREG) 3.125 MG tablet Take 1 tablet (3.125 mg total) by mouth 2 (two) times daily. 08/05/21 08/05/22  Leonie Man, MD  citalopram (CELEXA) 10 MG tablet Take 1 tablet (10 mg total) by mouth daily. 10/01/21   Leonie Man, MD  clopidogrel (PLAVIX) 75 MG tablet Take 75 mg by mouth daily. 10/30/21   [provider]  Coenzyme Q-10 200 MG CAPS Take 1 capsule by mouth daily at 6 (six) AM.    [provider]  Evolocumab (REPATHA SURECLICK) 962 MG/ML SOAJ Inject 1 mL into the skin every 14 (fourteen) days. 01/22/22   Leonie Man, MD  FARXIGA 10 MG TABS tablet TAKE  ONE TABLET (10MG  TOTAL) BY MOUTH DAILY BEFORE BREAKFAST 09/16/21   Bensimhon, Shaune Pascal, MD  furosemide (LASIX) 20 MG tablet Take 1 tablet (20 mg total) by mouth daily as needed. Use as needed for lower leg swelling Patient not taking: Reported on 03/16/2022 03/07/20   Evans Lance, MD  losartan (COZAAR) 25 MG tablet Take 1 tablet (25 mg total) by mouth at bedtime. 07/30/21   Bensimhon, Shaune Pascal, MD  nitroGLYCERIN (NITROSTAT) 0.4 MG SL tablet DISSOLVE 1 TABLET UNDER TONGUE EVERY 5 MINUTES AS NEEDED FOR CHEST PAIN 08/05/21   Leonie Man, MD  pantoprazole (PROTONIX) 40 MG tablet Take 1 tablet (40 mg total) by mouth daily. 08/05/21   Leonie Man, MD  rosuvastatin (CRESTOR) 40 MG tablet Take 1 tablet (40 mg total) by mouth daily. 07/24/21 03/16/22  Leonie Man, MD  spironolactone (ALDACTONE) 25 MG tablet Take 1 tablet (25 mg total) by mouth daily. 08/05/21   Leonie Man, MD      Allergies    Ezetimibe    Review of Systems   Review of Systems  Cardiovascular:  Positive for chest pain.  See HPI  Physical Exam Updated Vital Signs BP 112/74   Pulse 81   Temp 98.6 F (37 C) (Oral)   Resp 16   Ht 6\' 1"  (1.854 m)   Wt 110.7 kg   SpO2 94%   BMI 32.19 kg/m  Physical  Exam Vitals and nursing note reviewed.  Constitutional:      General: He is not in acute distress.    Appearance: He is well-developed.  HENT:     Head: Normocephalic and atraumatic.  Eyes:     Extraocular Movements: Extraocular movements intact.     Conjunctiva/sclera: Conjunctivae normal.     Pupils: Pupils are equal, round, and reactive to light.  Cardiovascular:     Rate and Rhythm: Normal rate and regular rhythm.     Pulses:          Radial pulses are 2+ on the right side and 2+ on the left side.       Posterior tibial pulses are 2+ on the right side and 2+ on the left side.     Heart sounds: No murmur heard.    Comments: No carotid bruits appreciated Pulmonary:     Effort: Pulmonary effort is normal.  No tachypnea or respiratory distress.     Breath sounds: Normal breath sounds.  Chest:     Chest wall: No tenderness.  Abdominal:     General: Bowel sounds are normal.     Palpations: Abdomen is soft.     Tenderness: There is no abdominal tenderness. There is no guarding.  Musculoskeletal:        General: No swelling. Normal range of motion.     Cervical back: Normal range of motion and neck supple.     Right lower leg: No tenderness. No edema.     Left lower leg: No tenderness. No edema.  Skin:    General: Skin is warm and dry.     Capillary Refill: Capillary refill takes less than 2 seconds.  Neurological:     General: No focal deficit present.     Mental Status: He is alert and oriented to person, place, and time.     GCS: GCS eye subscore is 4. GCS verbal subscore is 5. GCS motor subscore is 6.     Cranial Nerves: Cranial nerves 2-12 are intact.     Sensory: Sensation is intact.     Motor: Motor function is intact.     Coordination: Coordination is intact.     Gait: Gait is intact.     Comments: Finger-nose: Unremarkable Heel to shin: Unremarkable Romberg: Unremarkable  Psychiatric:        Mood and Affect: Mood normal.     ED Results / Procedures / Treatments   Labs (all labs ordered are listed, but only abnormal results are displayed) Labs Reviewed  BASIC METABOLIC PANEL - Abnormal; Notable for the following components:      Result Value   Glucose, Bld 117 (*)    All other components within normal limits  CBC  TROPONIN I (HIGH SENSITIVITY)  TROPONIN I (HIGH SENSITIVITY)    EKG EKG Interpretation  Date/Time:  Wednesday May 12 2022 10:19:05 EST Ventricular Rate:  82 PR Interval:  166 QRS Duration: 104 QT Interval:  378 QTC Calculation: 442 R Axis:   71 Text Interpretation: Sinus rhythm Abnormal lateral Q waves Probable anteroseptal infarct, old No significant change since last tracing Confirmed by Lacretia Leigh ([id number]) on 05/12/2022 10:26:51  AM  Radiology No results found.  Procedures Procedures    Medications Ordered in ED Medications - No data to display  ED Course/ Medical Decision Making/ A&P  Medical Decision Making Amount and/or Complexity of Data Reviewed Labs: ordered.   Edward Mendoza 52 y.o. presented today for lightheadedness. Working DDx that I considered at this time includes, but not limited to, ACS, PE, orthostatic HoTN, TIA/CVA, carotid artery dissection.  Review of prior external notes: 08/13/21 ECHO report  Unique Tests and My Interpretation: Troponin: unremarkable CBC: Unremarkable BMP: unremarkable EKG: Q wave indicative of previous MI; no acute changes, T wave abnormalities, or signs of ischemia  Discussion with Independent Historian: none  Discussion of Management of Tests: none  Risk: Low:  - based on diagnostic testing/clinical impression and treatment plan  Risk Stratification Score: Wells 0  Staffed with Lorre Nick, MD  R/o DDx: ACS: negative serial trops and no EKG changes PE: Wells score of zero suggest this is unlikely at this time TIA/CVA: no neuro deficits noted on exam to suggest this is the cause Carotid artery dissection: no carotid bruits on exam to suggest this is the cause; no neuro deficits on exam  Plan: Patient presented with lightheadedness on exertion that began yesterday.  Chest pain workup was initiated along with basic blood labs.  Patient verbalizes that he is asymptomatic at the moment and vitals are reassuring.  After discussing with patient we agreed that if labs come back unremarkable patient may be discharged with outpatient follow-up this patient appears stable in the ED.  Labs are reassuring for any ACS or life-threatening diagnosis. Patient has remained stable throughout ED course. After discussion with the patient, patient feels comfortable with discharge and PCP follow up. I suspect the cause of the symptoms is related to  orthostatic hypotension due to patient history however this will need to be followed up in the outpatient setting. Patient verbalized agreement to this plan.  Final Clinical Impression(s) / ED Diagnoses Final diagnoses:  Lightheadedness    Rx / DC Orders ED Discharge Orders     None         Remi Deter 05/12/22 1504    Lorre Nick, MD 05/14/22 1440

## 2022-05-12 NOTE — ED Provider Notes (Signed)
I provided a substantive portion of the care of this patient.  I personally performed the entirety of the medical decision making for this encounter.  EKG Interpretation  Date/Time:  Wednesday May 12 2022 10:19:05 EST Ventricular Rate:  82 PR Interval:  166 QRS Duration: 104 QT Interval:  378 QTC Calculation: 442 R Axis:   71 Text Interpretation: Sinus rhythm Abnormal lateral Q waves Probable anteroseptal infarct, old No significant change since last tracing Confirmed by Lacretia Leigh [phone]) on 05/12/2022 10:14:11 AM   52 year old male presents here from urgent care concern for possible acute coronary syndrome.  Patient does have a prior history of CAD.  He has been having periods of dizziness that occur with prolonged standing.  Denies any chest pain, chest pressure, shortness of breath.  Has been using nitroglycerin due to he states feeling anxious.  Denies any associated cardiac symptomatology with it.  Notes no and improvement when he takes it.  Patient is EKG for interpretation shows no acute coronary syndrome findings and unchanged from prior.  EKG from urgent care where patient went to prior to here did not show any evidence of ACS either.  Will workup for ACS to be the safe side given his history as well as check renal function.  Suspect hypotension as cause of his symptoms.   Lacretia Leigh, MD 05/12/22 [phone]

## 2022-05-12 NOTE — Telephone Encounter (Signed)
Hard to tell what the symptoms are from this brief description.  If these areas continue to happen, we may want to try to evaluate with a monitor to see if you are having runs of atrial fibrillation/flutter or supraventricular tachycardia that could make you feel that way this way.  Definitely need to keep up with Rx for Eliquis.   Glenetta Hew

## 2022-05-12 NOTE — Discharge Instructions (Addendum)
Please followup with PCP for recent ER visit.

## 2022-05-12 NOTE — ED Triage Notes (Signed)
Went to UC for c/o feeling unwell x 2 days. UC dispatches EMS for abnormal EKG. Patient has a history of MI. Denies cardiac symptoms at this time. VSS.

## 2022-05-17 LAB — CBC

## 2022-05-18 LAB — COMPREHENSIVE METABOLIC PANEL
ALT: 26 IU/L (ref 0–44)
AST: 22 IU/L (ref 0–40)
Albumin/Globulin Ratio: 1.7 (ref 1.2–2.2)
Albumin: 4.3 g/dL (ref 4.1–5.1)
Alkaline Phosphatase: 92 IU/L (ref 44–121)
BUN/Creatinine Ratio: 14 (ref 9–20)
BUN: 15 mg/dL (ref 6–24)
Bilirubin Total: 0.6 mg/dL (ref 0.0–1.2)
CO2: 25 mmol/L (ref 20–29)
Calcium: 9.7 mg/dL (ref 8.7–10.2)
Chloride: 106 mmol/L (ref 96–106)
Creatinine, Ser: 1.06 mg/dL (ref 0.76–1.27)
Globulin, Total: 2.5 g/dL (ref 1.5–4.5)
Glucose: 88 mg/dL (ref 70–99)
Potassium: 4.5 mmol/L (ref 3.5–5.2)
Sodium: 141 mmol/L (ref 134–144)
Total Protein: 6.8 g/dL (ref 6.0–8.5)
eGFR: 86 mL/min/{1.73_m2} (ref >59)

## 2022-05-18 LAB — CBC
Hematocrit: 45.2 % (ref 37.5–51.0)
Hemoglobin: 15.3 g/dL (ref 13.0–17.7)
MCH: 30.6 pg (ref 26.6–33.0)
MCHC: 33.8 g/dL (ref 31.5–35.7)
MCV: 90 fL (ref 79–97)
Platelets: 269 10*3/uL (ref 150–450)
RBC: 5 x10E6/uL (ref 4.14–5.80)
RDW: 12.2 % (ref 11.6–15.4)
WBC: 6.8 10*3/uL (ref 3.4–10.8)

## 2022-05-18 LAB — LIPID PANEL
Chol/HDL Ratio: 2 ratio (ref 0.0–5.0)
Cholesterol, Total: 107 mg/dL (ref 100–199)
HDL: 53 mg/dL (ref >39)
LDL Chol Calc (NIH): 29 mg/dL (ref 0–99)
Triglycerides: 146 mg/dL (ref 0–149)
VLDL Cholesterol Cal: 25 mg/dL (ref 5–40)

## 2022-05-28 ENCOUNTER — Other Ambulatory Visit: Payer: Self-pay | Admitting: Cardiology

## 2022-06-13 ENCOUNTER — Other Ambulatory Visit (HOSPITAL_COMMUNITY): Payer: Self-pay | Admitting: Cardiology

## 2022-06-29 ENCOUNTER — Ambulatory Visit: Payer: BC Managed Care – PPO | Attending: Physician Assistant | Admitting: Physician Assistant

## 2022-06-29 ENCOUNTER — Encounter: Payer: Self-pay | Admitting: Physician Assistant

## 2022-06-29 VITALS — BP 118/80 | HR 105 | Ht 73.0 in | Wt 244.6 lb

## 2022-06-29 DIAGNOSIS — I5020 Unspecified systolic (congestive) heart failure: Secondary | ICD-10-CM

## 2022-06-29 DIAGNOSIS — I2510 Atherosclerotic heart disease of native coronary artery without angina pectoris: Secondary | ICD-10-CM

## 2022-06-29 MED ORDER — CARVEDILOL 6.25 MG PO TABS: 6.2500 mg | ORAL_TABLET | Freq: Two times a day (BID) | ORAL | 3 refills | Status: DC

## 2022-06-29 NOTE — Progress Notes (Signed)
Office Visit    Patient Name: Edward Mendoza Date of Encounter: 06/29/2022  PCP:  Mateo Flow, MD   [facility]  Cardiologist:  Glenetta Hew, MD  Advanced Practice Provider:  Deberah Pelton, NP Electrophysiologist:  None   HPI    Edward Mendoza is a 52 y.o. male with a past medical history of acute anterior STEMI complicated by witnessed VF, 64 to 45-minute ACLS before ROSC, echocardiogram 3/13 with LVEF 20 to 25% post PCI atrial flutter with cardioversion (07/21/2019), history of multiple strokes, ischemic cardiomyopathy, hyperlipidemia, tobacco abuse presents today for hospital follow-up.  Patient was recently seen in the ED with concern for possible acute coronary syndrome.  Prior history of CAD noted above.  He endorsed.  Send dizziness that occurred with prolonged standing.  Denied chest pain, chest pressure, or shortness of breath.  Had been using nitro due to feeling anxious.  Denied associated cardiac symptoms.  He did not note improvement with taking nitro.  EKG showed no acute coronary syndrome findings and unchanged from prior.  Last echocardiogram April 2023 with LVEF 25 to 30%.  Troponin trends were unremarkable.He was discharged with close follow-up.  Today, he tells me that he wasn't feeling well at work and was having some lightheadedness.  He went to urgent care it was too early in the morning and we were not open.  An EKG was obtained and he was told that he had a heart attack 2 hours prior.  EMS was called and another EKG was obtained and that provider did not agree.  Troponin was unremarkable and EKG was unremarkable at the ER.  He was discharged with close follow-up.  This morning he was not feeling well.  Had some indigestion.  This was his anginal equivalent back when he had his heart attack and needed stenting.  However, this indigestion is slightly different and comes and goes throughout the day.  He did endorse this feeling after [employer]  and then also at nighttime before bed.  More consistent with a GI culprit versus cardiac.  Nonexertional.  He does follow with Dr. Haroldine Laws and has an appointment next month.  He does struggle with anxiety which she does not take any medication for and sometimes does have some chest tightness when he is anxious.  This morning, he was feeling funny before work.  Decided to go in a little late at 8 AM.  Martin Majestic about his daily routine without any further issues. He is tachy today but does share that he is nervous.   Reports no shortness of breath nor dyspnea on exertion.  No edema, orthopnea, PND. Reports no palpitations.   Past Medical History    Past Medical History:  Diagnosis Date   Acute ST elevation myocardial infarction (STEMI) involving left anterior descending coronary artery (HCC)    Presented as VF arrest -> 45 minutes CPR with ROSC => Ant STEMI: Cath = 95% thrombotic/hazy prox-mid LAD -> DES PCI. ->  Severe Ischemic Cardiomyopathy (EF improved from 15% to 35-40%).   Cardiac arrest with ventricular fibrillation ([place]) 07/11/2019   Chronic combined systolic and diastolic CHF, NYHA class 1 ([place]) 07/2019   Initial EF of 50% improved to 35-40%.  Class I-II CHF   History of multiple strokes 07/22/2019   Multiple CVAs noted on MRI post CARDIAC ARREST-prolonged CPR (innumerable punctate acute infarctions scattered throughout the cerebellum and s bilateral cerebellar hemispheres most consistent with micro emboli) no residual symptoms.  Hyperlipidemia with target LDL less than 70 07/22/2019   Ischemic cardiomyopathy -> EF ~35-40% by CMRI 07/11/2019   following anterior MI and cardiac arrest-initial EF 15% improved to 35-40% 3 months post PCI.   Paroxysmal atrial fibrillation/a atrial flutter ->  in setting of post MI/cardiac arrest 07/18/2019   Had PAF-flutter following rewarming from [place] after cardiac arrest: TEE DCCV on 07/21/2019   Single vessel coronary disease 07/11/2019   Anterior  STEMI complicated by VF arrest.  1V CAD - 95-99% p-mLAD => DES PCI: Resolute Onyx DES 3.5 x 18 (post-dilated to 4.0 mm)   Smoker 07/13/2019   Past Surgical History:  Procedure Laterality Date   ARTERIAL LINE INSERTION N/A 07/11/2019   Procedure: ARTERIAL LINE INSERTION;  Surgeon: Leonie Man, MD;  Location: [facility];  Service: Cardiovascular;  Laterality: N/A;   CARDIAC MRI  01/11/2020   LVEF ~39%. Akinesis of basal-mid inferoseptal, anteroseptal & apical anterior-septal-inferior walls as well as tru apex.  Paradoxical septal WM. 75-100% late enhancement - FIXED INFARCT. Normal RV function. Mod LA dilation. Mild-mod MR & TR.   CARDIOVERSION N/A 07/21/2019   Procedure: CARDIOVERSION;  Surgeon: Jolaine Artist, MD;  Location: [facility] ENDOSCOPY;  Service: Cardiovascular;; TTE - DCCV: Atrial flutter-restored sinus rhythm   CORONARY STENT INTERVENTION N/A 07/11/2019   Procedure: CORONARY STENT INTERVENTION;  Surgeon: Leonie Man, MD;  Location: [facility];  Service: Cardiovascular;;  proximal-mid LAD 95% hazy lesion (DES PCI Resolute Onyx 3.5 mm 18 mm-at 4.0 mm),   CORONARY/GRAFT ACUTE MI REVASCULARIZATION N/A 07/11/2019   Procedure: Coronary/Graft Acute MI Revascularization;  Surgeon: Leonie Man, MD;  [facility]: [facility] INVASIVE CV LAB;  DES PCI of p-m LAD 95-99%.   RIGHT/LEFT HEART CATH AND CORONARY ANGIOGRAPHY N/A 07/11/2019   Procedure: RIGHT/LEFT HEART CATH AND CORONARY ANGIOGRAPHY;  Surgeon: Leonie Man, MD;  Location: [facility] INVASIVE CV LAB:: Ant STEMI/Cardiac Arrest:  p-mLAD 95-99% thrombotic stenosis --> DES PCI. EF ~35%. Normal RHC Pressures. PCWP ~20 mmHg with LVEDP ~18 mmHg.  CO-CI 4.77 - 2.04 (no MCS placed)    TEE WITHOUT CARDIOVERSION N/A 07/21/2019   Procedure: TRANSESOPHAGEAL ECHOCARDIOGRAM (TEE) W/ CARDIOVERSION;  Surgeon: Jolaine Artist, MD;  Location: [facility] ENDOSCOPY;;  EF 30 to 35%.  Mid-apical anterior and apical hypo to akinesis.  No LAA. ->   Successful DCCV of Atrial Flutter.   TRANSTHORACIC ECHOCARDIOGRAM  07/2019   a) Initial Post PCI-postarrest: EF~15%.  Severely reduced with global HK.  Moderate LVH.  Mildly dilated IVC.; b) 07/14/2019: EF 20 to 25%.  Entire anteroseptal, anterior and apical wall severe hypokinesis with moderate HK of mid-anterior inferior wall.  (Improved EF).   TRANSTHORACIC ECHOCARDIOGRAM  10/24/2019   EF 35 to 40%.  Moderately dilated LV indeterminate filling pressures.  Akinetic mid anteroseptal wall as well as apical anterior and apical septal wall.  Otherwise normal valves and remaining chambers are normal.  History of PFO but no shunting.   TRANSTHORACIC ECHOCARDIOGRAM  08/2020   EF ~30-35% (~ slightly worse than 10/2019).    Allergies  Allergies  Allergen Reactions   Ezetimibe Other (See Comments)    Arms locking up     EKGs/Labs/Other Studies Reviewed:   The following studies were reviewed today:  Echocardiogram 08/13/21 IMPRESSIONS     1. Left ventricular ejection fraction, by estimation, is 25 to 30%. The  left ventricle has moderate to severely decreased function. The left  ventricle has no regional wall motion abnormalities. The  left ventricular  internal cavity size was moderately  dilated. Left ventricular diastolic parameters were normal.   2. Right ventricular systolic function is normal. The right ventricular  size is normal.   3. Right atrial size was moderately dilated.   4. The mitral valve is normal in structure. Trivial mitral valve  regurgitation. No evidence of mitral stenosis.   5. The aortic valve is normal in structure. Aortic valve regurgitation is  not visualized. Aortic valve sclerosis/calcification is present, without  any evidence of aortic stenosis.   6. The inferior vena cava is normal in size with greater than 50%  respiratory variability, suggesting right atrial pressure of 3 mmHg.   FINDINGS   Left Ventricle: Left ventricular ejection fraction, by  estimation, is 25  to 30%. The left ventricle has moderate to severely decreased function.  The left ventricle has no regional wall motion abnormalities. The left  ventricular internal cavity size was  moderately dilated. There is no left ventricular hypertrophy. Left  ventricular diastolic parameters were normal.   Right Ventricle: The right ventricular size is normal. No increase in  right ventricular wall thickness. Right ventricular systolic function is  normal.   Left Atrium: Left atrial size was normal in size.   Right Atrium: Right atrial size was moderately dilated.   Pericardium: There is no evidence of pericardial effusion.   Mitral Valve: The mitral valve is normal in structure. Trivial mitral  valve regurgitation. No evidence of mitral valve stenosis.   Tricuspid Valve: The tricuspid valve is normal in structure. Tricuspid  valve regurgitation is trivial. No evidence of tricuspid stenosis.   Aortic Valve: The aortic valve is normal in structure. Aortic valve  regurgitation is not visualized. Aortic valve sclerosis/calcification is  present, without any evidence of aortic stenosis. Aortic valve peak  gradient measures 2.9 mmHg.   Pulmonic Valve: The pulmonic valve was normal in structure. Pulmonic valve  regurgitation is trivial. No evidence of pulmonic stenosis.   Aorta: The aortic root is normal in size and structure.   Venous: The inferior vena cava is normal in size with greater than 50%  respiratory variability, suggesting right atrial pressure of 3 mmHg.   IAS/Shunts: No atrial level shunt detected by color flow Doppler.   EKG:  EKG is not ordered today.   Reviewed EKG from 05/13/22  Recent Labs: 05/17/2022: ALT 26; BUN 15; Creatinine, Ser 1.06; Hemoglobin 15.3; Platelets 269; Potassium 4.5; Sodium 141  Recent Lipid Panel    Component Value Date/Time   CHOL 107 05/17/2022 0812   TRIG 146 05/17/2022 0812   HDL 53 05/17/2022 0812   CHOLHDL 2.0 05/17/2022  0812   CHOLHDL 6.6 07/11/2019 0307   VLDL 60 (H) 07/11/2019 0307   LDLCALC 29 05/17/2022 0812    Home Medications   Current Meds  Medication Sig   apixaban (ELIQUIS) 5 MG TABS tablet Take 1 tablet (5 mg total) by mouth 2 (two) times daily.   carvedilol (COREG) 6.25 MG tablet Take 1 tablet (6.25 mg total) by mouth 2 (two) times daily.   citalopram (CELEXA) 10 MG tablet Take 1 tablet (10 mg total) by mouth daily.   clopidogrel (PLAVIX) 75 MG tablet Take 75 mg by mouth daily.   Coenzyme Q-10 200 MG CAPS Take 1 capsule by mouth daily at 6 (six) AM.   Evolocumab (REPATHA SURECLICK) [id number] MG/ML SOAJ Inject 1 mL into the skin every 14 (fourteen) days.   FARXIGA 10 MG TABS tablet TAKE ONE TABLET ('10MG'$   TOTAL) BY MOUTH DAILY BEFORE BREAKFAST   losartan (COZAAR) 25 MG tablet Take 1 tablet (25 mg total) by mouth at bedtime.   nitroGLYCERIN (NITROSTAT) 0.4 MG SL tablet DISSOLVE 1 TABLET UNDER TONGUE EVERY 5 MINUTES AS NEEDED FOR CHEST PAIN   pantoprazole (PROTONIX) 40 MG tablet Take 1 tablet (40 mg total) by mouth daily.   spironolactone (ALDACTONE) 25 MG tablet Take 1 tablet (25 mg total) by mouth daily.   [DISCONTINUED] carvedilol (COREG) 3.125 MG tablet Take 1 tablet (3.125 mg total) by mouth 2 (two) times daily.     Review of Systems      All other systems reviewed and are otherwise negative except as noted above.  Physical Exam    VS:  BP 118/80   Pulse (!) 105   Ht '6\' 1"'$  (1.854 m)   Wt 244 lb 9.6 oz (110.9 kg)   SpO2 97%   BMI 32.27 kg/m  , BMI Body mass index is 32.27 kg/m.  Wt Readings from Last 3 Encounters:  06/29/22 244 lb 9.6 oz (110.9 kg)  05/12/22 244 lb (110.7 kg)  03/16/22 244 lb 3.2 oz (110.8 kg)     GEN: Well nourished, well developed, in no acute distress. HEENT: normal. Neck: Supple, no JVD, carotid bruits, or masses. Cardiac: RRR, no murmurs, rubs, or gallops. No clubbing, cyanosis, edema.  Radials/PT 2+ and equal bilaterally.  Respiratory:  Respirations regular  and unlabored, clear to auscultation bilaterally. GI: Soft, nontender, nondistended. MS: No deformity or atrophy. Skin: Warm and dry, no rash. Neuro:  Strength and sensation are intact. Psych: Normal affect.  Assessment & Plan    STEMI/CAD s/p PCI of LAD -his anginal equivalent is indigestion pain which is constant -His indigestion pain today comes and goes and is nonexertional -Will plan to update echocardiogram -Will order carotid ultrasound due to history of CAD and dizziness -continue current medications  ICM/HFrEF -He follows with Dr. Haroldine Laws and has an appointment next month -Continue current medications which include Eliquis 5 mg twice a day, carvedilol (increased 6.25 mg twice a day), Plavix 75 mg daily, Farxiga 10 mg daily, Repatha 140 mg liter every 14 days, Cozaar 25 mg daily, nitro as needed, Crestor 40 mg daily, spironolactone 25 mg daily -Update echocardiogram  Hyperlipidemia -Recent lipid panel showed LDL 29, HDL 53, total cholesterol 107, and triglycerides 146 (05/2022) -He remains on Repatha, Crestor -continue current medications         Disposition: Follow up 3-4 months with Glenetta Hew, MD or APP.  Signed, Elgie Collard, PA-C 06/29/2022, 10:33 AM [place]

## 2022-06-29 NOTE — Patient Instructions (Signed)
Medication Instructions:  1.Increase carvedilol (Coreg) to 6.25 mg twice daily *If you need a refill on your cardiac medications before your next appointment, please call your pharmacy*   Lab Work: None If you have labs (blood work) drawn today and your tests are completely normal, you will receive your results only by: [place] (if you have MyChart) OR A paper copy in the mail If you have any lab test that is abnormal or we need to change your treatment, we will call you to review the results.   Testing/Procedures: Your physician has requested that you have an echocardiogram. Echocardiography is a painless test that uses sound waves to create images of your heart. It provides your doctor with information about the size and shape of your heart and how well your heart's chambers and valves are working. This procedure takes approximately one hour. There are no restrictions for this procedure. Please do NOT wear cologne, perfume, aftershave, or lotions (deodorant is allowed). Please arrive 15 minutes prior to your appointment time.   Your physician has requested that you have a carotid duplex. This test is an ultrasound of the carotid arteries in your neck. It looks at blood flow through these arteries that supply the brain with blood. Allow one hour for this exam. There are no restrictions or special instructions.   Follow-Up: At [facility], you and your health needs are our priority.  As part of our continuing mission to provide you with exceptional heart care, we have created designated Provider Care Teams.  These Care Teams include your primary Cardiologist (physician) and Advanced Practice Providers (APPs -  Physician Assistants and Nurse Practitioners) who all work together to provide you with the care you need, when you need it.   Your next appointment:   3-4 month(s)  Provider:   Glenetta Hew, MD  Other Instructions Check your blood pressure and heart rate  daily, one hour after taking your morning medications for 2 weeks, keep a log and send [place] the readings through mychart at the end of the 2 weeks

## 2022-07-12 ENCOUNTER — Other Ambulatory Visit: Payer: Self-pay

## 2022-07-12 ENCOUNTER — Other Ambulatory Visit: Payer: Self-pay | Admitting: Cardiology

## 2022-07-12 MED ORDER — CLOPIDOGREL BISULFATE 75 MG PO TABS: 75.0000 mg | ORAL_TABLET | Freq: Every day | ORAL | 3 refills | Status: DC

## 2022-07-12 MED ORDER — SPIRONOLACTONE 25 MG PO TABS: 25.0000 mg | ORAL_TABLET | Freq: Every day | ORAL | 3 refills | Status: DC

## 2022-07-12 MED ORDER — ROSUVASTATIN CALCIUM 40 MG PO TABS: 40.0000 mg | ORAL_TABLET | Freq: Every day | ORAL | 3 refills | Status: DC

## 2022-07-14 ENCOUNTER — Ambulatory Visit: Payer: BC Managed Care – PPO | Attending: Physician Assistant

## 2022-07-14 ENCOUNTER — Ambulatory Visit (INDEPENDENT_AMBULATORY_CARE_PROVIDER_SITE_OTHER): Payer: BC Managed Care – PPO

## 2022-07-14 DIAGNOSIS — I2510 Atherosclerotic heart disease of native coronary artery without angina pectoris: Secondary | ICD-10-CM

## 2022-07-14 DIAGNOSIS — I5020 Unspecified systolic (congestive) heart failure: Secondary | ICD-10-CM | POA: Diagnosis not present

## 2022-07-14 DIAGNOSIS — R42 Dizziness and giddiness: Secondary | ICD-10-CM

## 2022-07-14 LAB — ECHOCARDIOGRAM COMPLETE
Calc EF: 40.7 %
S' Lateral: 5.5 cm
Single Plane A2C EF: 42.2 %
Single Plane A4C EF: 39 %

## 2022-07-14 MED ORDER — PERFLUTREN LIPID MICROSPHERE
1.0000 mL | INTRAVENOUS | Status: AC | PRN
  Administered 2022-07-14: 8 mL via INTRAVENOUS

## 2022-07-28 ENCOUNTER — Other Ambulatory Visit: Payer: Self-pay | Admitting: *Deleted

## 2022-07-28 MED ORDER — NITROGLYCERIN 0.4 MG SL SUBL: SUBLINGUAL_TABLET | SUBLINGUAL | 11 refills | Status: DC

## 2022-07-30 ENCOUNTER — Encounter (HOSPITAL_COMMUNITY): Payer: Self-pay | Admitting: Internal Medicine

## 2022-07-30 ENCOUNTER — Ambulatory Visit (HOSPITAL_COMMUNITY)
Admission: RE | Admit: 2022-07-30 | Discharge: 2022-07-30 | Disposition: A | Discharge status: Home / Self Care | Payer: BC Managed Care – PPO | Source: Ambulatory Visit | Attending: Internal Medicine | Admitting: Internal Medicine

## 2022-07-30 VITALS — BP 120/70 | HR 73 | Wt 243.2 lb

## 2022-07-30 DIAGNOSIS — Z7902 Long term (current) use of antithrombotics/antiplatelets: Secondary | ICD-10-CM | POA: Diagnosis not present

## 2022-07-30 DIAGNOSIS — Z8673 Personal history of transient ischemic attack (TIA), and cerebral infarction without residual deficits: Secondary | ICD-10-CM | POA: Insufficient documentation

## 2022-07-30 DIAGNOSIS — Z79899 Other long term (current) drug therapy: Secondary | ICD-10-CM | POA: Insufficient documentation

## 2022-07-30 DIAGNOSIS — Z7901 Long term (current) use of anticoagulants: Secondary | ICD-10-CM | POA: Insufficient documentation

## 2022-07-30 DIAGNOSIS — Z7984 Long term (current) use of oral hypoglycemic drugs: Secondary | ICD-10-CM | POA: Diagnosis not present

## 2022-07-30 DIAGNOSIS — I4892 Unspecified atrial flutter: Secondary | ICD-10-CM | POA: Insufficient documentation

## 2022-07-30 DIAGNOSIS — E785 Hyperlipidemia, unspecified: Secondary | ICD-10-CM | POA: Diagnosis not present

## 2022-07-30 DIAGNOSIS — I5022 Chronic systolic (congestive) heart failure: Secondary | ICD-10-CM | POA: Insufficient documentation

## 2022-07-30 DIAGNOSIS — Z8674 Personal history of sudden cardiac arrest: Secondary | ICD-10-CM | POA: Diagnosis not present

## 2022-07-30 DIAGNOSIS — I480 Paroxysmal atrial fibrillation: Secondary | ICD-10-CM | POA: Diagnosis not present

## 2022-07-30 DIAGNOSIS — Z955 Presence of coronary angioplasty implant and graft: Secondary | ICD-10-CM | POA: Insufficient documentation

## 2022-07-30 DIAGNOSIS — I2510 Atherosclerotic heart disease of native coronary artery without angina pectoris: Secondary | ICD-10-CM | POA: Diagnosis not present

## 2022-07-30 DIAGNOSIS — I252 Old myocardial infarction: Secondary | ICD-10-CM | POA: Insufficient documentation

## 2022-07-30 MED ORDER — CARVEDILOL 6.25 MG PO TABS: 6.2500 mg | ORAL_TABLET | Freq: Two times a day (BID) | ORAL | 3 refills | Status: DC

## 2022-07-30 NOTE — Progress Notes (Signed)
PCP: Cardiology: Dr Ellyn Hack Primary HF Cardiologist: Dr Haroldine Laws   HEART FAILURE CLINIC NOTE  HPI: [name] is a 52 year old with history of CAD, STEMI, VF arrest 07/11/19 with iCM and CVAs.   In 3/21 presented with anterior STEMI with witnessed VF. He had a total of 40-45 minutes of ACLS before ROSC  LHC showed 99% proximal LAD occlusion treated with PCI/DES to LAD. No other significant disease. He was started on DAPT with Aspirin and Brilinta. Brilinta was stopped and plavix started with the addition of Eliquis (for aflutter). TEE DC-CV 3/20 with conversion to NSR EF 30-35%.   Echo 6/21 EF 35-40%  cMRI 9/21 EF 39% RV normal. Mild to moderate MR  Echo 5/22 EF 30-35% No MR  Zio 9/22 1. Sinus rhythm - avg HR of 70 bpm Rhythm. 2. Three runs of SVT occurred, the run with the fastest interval lasting 5 beats with a max rate of 182 bpm, the longest lasting 9 beats with an avg rate of 146 bpm.  3. Supraventricular Tachycardia was detected within +/- 45 seconds of symptomatic patient event(s).  4. Rare PACs and PVCs.  5. Most patient-triggered events associated with isolated PVCs.  Here for f/u. Feels great. Exercising hard without CP or SOB. Working [employer]. Compliant with meds  Echo  07/09/22 EF 35-40% RV ok   ROS: All systems negative except as listed in HPI, PMH and Problem List.  SH:  Social History   Socioeconomic History   Marital status: Married    Spouse name: Not on file   Number of children: Not on file   Years of education: Not on file   Highest education level: Not on file  Occupational History   Occupation: Retail buyer    Employer: [employer]  Tobacco Use   Smoking status: Former    Packs/day: 1.00    Years: 20.00    Additional pack years: 0.00    Total pack years: 20.00    Types: Cigarettes    Start date: 2000    Quit date: 07/11/2019    Years since quitting: 3.0   Smokeless tobacco: Never  Substance and Sexual Activity    Alcohol use: Not Currently   Drug use: Not Currently   Sexual activity: Yes  Other Topics Concern   Not on file  Social History Narrative   Not on file   Social Determinants of Health   Financial Resource Strain: Not on file  Food Insecurity: Not on file  Transportation Needs: No Transportation Needs (08/07/2019)   PRAPARE - Hydrologist (Medical): No    Lack of Transportation (Non-Medical): No  Physical Activity: Sufficiently Active (08/07/2019)   Exercise Vital Sign    Days of Exercise per Week: 7 days    Minutes of Exercise per Session: 30 min  Stress: Not on file  Social Connections: Not on file  Intimate Partner Violence: Not on file    FH:  Family History  Family history unknown: Yes    Past Medical History:  Diagnosis Date   Acute ST elevation myocardial infarction (STEMI) involving left anterior descending coronary artery ([place])    Presented as VF arrest -> 45 minutes CPR with ROSC => Ant STEMI: Cath = 95% thrombotic/hazy prox-mid LAD -> DES PCI. ->  Severe Ischemic Cardiomyopathy (EF improved from 15% to 35-40%).   Cardiac arrest with ventricular fibrillation ([place]) 07/11/2019   Chronic combined systolic and diastolic CHF, NYHA class 1 ([place])  07/2019   Initial EF of 50% improved to 35-40%.  Class I-II CHF   History of multiple strokes 07/22/2019   Multiple CVAs noted on MRI post CARDIAC ARREST-prolonged CPR (innumerable punctate acute infarctions scattered throughout the cerebellum and s bilateral cerebellar hemispheres most consistent with micro emboli) no residual symptoms.   Hyperlipidemia with target LDL less than 70 07/22/2019   Ischemic cardiomyopathy -> EF ~35-40% by CMRI 07/11/2019   following anterior MI and cardiac arrest-initial EF 15% improved to 35-40% 3 months post PCI.   Paroxysmal atrial fibrillation/a atrial flutter ->  in setting of post MI/cardiac arrest 07/18/2019   Had PAF-flutter following rewarming from [employer] sun after  cardiac arrest: TEE DCCV on 07/21/2019   Single vessel coronary disease 07/11/2019   Anterior STEMI complicated by VF arrest.  1V CAD - 95-99% p-mLAD => DES PCI: Resolute Onyx DES 3.5 x 18 (post-dilated to 4.0 mm)   Smoker 07/13/2019    Current Outpatient Medications  Medication Sig Dispense Refill   apixaban (ELIQUIS) 5 MG TABS tablet Take 1 tablet (5 mg total) by mouth 2 (two) times daily. 60 tablet 5   carvedilol (COREG) 3.125 MG tablet Take 3.125 mg by mouth 2 (two) times daily with a meal.     citalopram (CELEXA) 10 MG tablet Take 1 tablet (10 mg total) by mouth daily. 90 tablet 3   clopidogrel (PLAVIX) 75 MG tablet Take 1 tablet (75 mg total) by mouth daily. 90 tablet 3   Coenzyme Q-10 200 MG CAPS Take 1 capsule by mouth daily at 6 (six) AM.     Evolocumab (REPATHA SURECLICK) [id number] MG/ML SOAJ Inject 1 mL into the skin every 14 (fourteen) days. 6 mL 3   FARXIGA 10 MG TABS tablet TAKE ONE TABLET (10MG  TOTAL) BY MOUTH DAILY BEFORE BREAKFAST 90 tablet 3   losartan (COZAAR) 25 MG tablet Take 1 tablet (25 mg total) by mouth at bedtime. 90 tablet 3   nitroGLYCERIN (NITROSTAT) 0.4 MG SL tablet DISSOLVE 1 TABLET UNDER TONGUE EVERY 5 MINUTES AS NEEDED FOR CHEST PAIN 25 tablet 11   pantoprazole (PROTONIX) 40 MG tablet Take 1 tablet (40 mg total) by mouth daily. 90 tablet 3   rosuvastatin (CRESTOR) 40 MG tablet Take 1 tablet (40 mg total) by mouth daily. 90 tablet 3   spironolactone (ALDACTONE) 25 MG tablet Take 1 tablet (25 mg total) by mouth daily. 90 tablet 3   No current facility-administered medications for this encounter.    Vitals:   07/30/22 1048  BP: 120/70  Pulse: 73  SpO2: 100%  Weight: 110.3 kg (243 lb 3.2 oz)     PHYSICAL EXAM: General:  Well appearing. No resp difficulty HEENT: normal Neck: supple. no JVD. Carotids 2+ bilat; no bruits. No lymphadenopathy or thryomegaly appreciated. Cor: PMI nondisplaced. Regular rate & rhythm. No rubs, gallops or murmurs. Lungs:  clear Abdomen: soft, nontender, nondistended. No hepatosplenomegaly. No bruits or masses. Good bowel sounds. Extremities: no cyanosis, clubbing, rash, edema Neuro: alert & orientedx3, cranial nerves grossly intact. moves all 4 extremities w/o difficulty. Affect pleasant  ECG NSR 73 anterior Qs Personally reviewed  ASSESSMENT & PLAN:  1. CAD - LHC 3/21 showed 99% proximal LAD thrombotic occlusion, s/p PCI + DES placement. - Very active. No s/s angina - Continue Plavix - Lipids managed by Dr. Ellyn Hack -> now on Crestor, Repatha and Zetia  2. Chronic Systolic Heart Failure  -Echo 07/11/19 EF 15%. RV systolic function mildly reduced  - f/u echo on  3/13 LVEF 20-25%, TEE 07/21/19 EF 30-35% - Echo 6/21 EF 35-40% - cMRI 9/21 EF 39% RV normal. Mild to moderate MR - Echo 5/22 EF 30-35% (I thought 35-40% with anterior and anteroseptal AK) - Echo  07/09/22 EF 35-40% RV ok  - Doing great NYHA I  Volume ok - Increase carvedilol 3to 6.25 mg twice a day as need  - Continue losartan 25 qhs (off Entresto due to lowBP) - Continue spiro 25 mg daily.  - Continue Farxiga 10  - No ICD for now with EF 35-40% and NYHA I. Stable  3. H/O VF Arrest in the setting of MI.  - Plan as above  4. Multiple CVAs as on MRI  - no residual  5. Atrial flutter, post-MI - occurred post-MI, no recurrence  - Zio 9/22 no AF - Will continue Eliquis. Particularly with apical AK  Glori Bickers, MD  11:31 AM

## 2022-07-30 NOTE — Patient Instructions (Signed)
INCREASE Carvedilol to 6.25 mg Twice daily  Your physician recommends that you schedule a follow-up appointment in: 6 months ( September) ** please call the office in July to arrange your follow up appointment. **  If you have any questions or concerns before your next appointment please send [place] a message through [place] or call our office at [phone].    TO LEAVE A MESSAGE FOR THE NURSE SELECT OPTION 2, PLEASE LEAVE A MESSAGE INCLUDING: YOUR NAME DATE OF BIRTH CALL BACK NUMBER REASON FOR CALL**this is important as we prioritize the call backs  YOU WILL RECEIVE A CALL BACK THE SAME DAY AS LONG AS YOU CALL BEFORE 4:00 PM  At the [facility], you and your health needs are our priority. As part of our continuing mission to provide you with exceptional heart care, we have created designated Provider Care Teams. These Care Teams include your primary Cardiologist (physician) and Advanced Practice Providers (APPs- Physician Assistants and Nurse Practitioners) who all work together to provide you with the care you need, when you need it.   You may see any of the following providers on your designated Care Team at your next follow up: Dr Glori Bickers Dr Loralie Champagne Dr. Roxana Hires, NP Lyda Jester, [facility] [place], [place] Forestine Na, NP Audry Riles, PharmD   Please be sure to bring in all your medications bottles to every appointment.    Thank you for choosing [facility]

## 2022-07-30 NOTE — Addendum Note (Signed)
Encounter addended by: Jerl Mina, RN on: 07/30/2022 11:41 AM  Actions taken: Order list changed, Clinical Note Signed

## 2022-09-06 ENCOUNTER — Other Ambulatory Visit (HOSPITAL_COMMUNITY): Payer: Self-pay

## 2022-09-06 ENCOUNTER — Telehealth: Payer: Self-pay

## 2022-09-06 ENCOUNTER — Telehealth: Payer: Self-pay | Admitting: Pharmacist

## 2022-09-06 NOTE — Telephone Encounter (Signed)
Pharmacy Patient Advocate Encounter   Received notification from [facility] that prior authorization for REPATHA is needed.    PA submitted on 09/06/22 Key [id number] Status is pending  Haze Rushing, CPhT Pharmacy Patient Advocate Specialist Direct Number: [phone] Fax: [phone]

## 2022-09-06 NOTE — Telephone Encounter (Signed)
Pharmacy Patient Advocate Encounter  Prior Authorization for REPATHA has been approved.    Effective dates: 09/06/22 through 09/06/23  Adenike Shidler, CPhT Pharmacy Patient Advocate Specialist Direct Number: [phone] Fax: [phone] 

## 2022-09-09 NOTE — Telephone Encounter (Signed)
Prior Authorization for REPATHA has been approved.     Effective dates: 09/06/22 through 09/06/23 Informed patient on 09/09/2022

## 2022-09-10 ENCOUNTER — Other Ambulatory Visit (HOSPITAL_COMMUNITY): Payer: Self-pay | Admitting: Internal Medicine

## 2022-09-29 ENCOUNTER — Other Ambulatory Visit (HOSPITAL_COMMUNITY): Payer: Self-pay

## 2022-10-08 ENCOUNTER — Other Ambulatory Visit: Payer: Self-pay

## 2022-10-08 MED ORDER — CITALOPRAM HYDROBROMIDE 10 MG PO TABS: 10.0000 mg | ORAL_TABLET | Freq: Every day | ORAL | 1 refills | Status: DC

## 2022-10-13 ENCOUNTER — Other Ambulatory Visit: Payer: Self-pay | Admitting: *Deleted

## 2022-10-13 DIAGNOSIS — I480 Paroxysmal atrial fibrillation: Secondary | ICD-10-CM

## 2022-10-13 MED ORDER — APIXABAN 5 MG PO TABS: 5.0000 mg | ORAL_TABLET | Freq: Two times a day (BID) | ORAL | 5 refills | Status: DC

## 2022-10-13 NOTE — Telephone Encounter (Signed)
Eliquis 5mg  refill request received. Patient is 52 years old, weight-110.3kg, Crea- 1.06 on 05/17/22, Diagnosis-Aflutter, and last seen by Dr. Gala Romney on 07/30/22. Dose is appropriate based on dosing criteria. Will send in refill to requested pharmacy.

## 2022-11-02 ENCOUNTER — Encounter (HOSPITAL_COMMUNITY): Payer: Self-pay | Admitting: Internal Medicine

## 2022-11-02 MED ORDER — CARVEDILOL 3.125 MG PO TABS: 3.1250 mg | ORAL_TABLET | Freq: Two times a day (BID) | ORAL | 3 refills | Status: DC

## 2022-12-11 ENCOUNTER — Encounter (HOSPITAL_COMMUNITY): Payer: Self-pay

## 2022-12-11 ENCOUNTER — Emergency Department (HOSPITAL_COMMUNITY)
Admission: EM | Admit: 2022-12-11 | Discharge: 2022-12-11 | Disposition: A | Discharge status: Home / Self Care | Payer: BC Managed Care – PPO | Attending: Emergency Medicine | Admitting: Emergency Medicine

## 2022-12-11 ENCOUNTER — Other Ambulatory Visit: Payer: Self-pay

## 2022-12-11 ENCOUNTER — Emergency Department (HOSPITAL_COMMUNITY): Payer: BC Managed Care – PPO

## 2022-12-11 DIAGNOSIS — R079 Chest pain, unspecified: Secondary | ICD-10-CM

## 2022-12-11 DIAGNOSIS — Z7902 Long term (current) use of antithrombotics/antiplatelets: Secondary | ICD-10-CM | POA: Diagnosis not present

## 2022-12-11 DIAGNOSIS — Z7901 Long term (current) use of anticoagulants: Secondary | ICD-10-CM | POA: Diagnosis not present

## 2022-12-11 LAB — CBC WITH DIFFERENTIAL/PLATELET
Abs Immature Granulocytes: 0.02 10*3/uL (ref 0.00–0.07)
Basophils Absolute: 0.1 10*3/uL (ref 0.0–0.1)
Basophils Relative: 1 %
Eosinophils Absolute: 0.1 10*3/uL (ref 0.0–0.5)
Eosinophils Relative: 1 %
HCT: 45.7 % (ref 39.0–52.0)
Hemoglobin: 14.9 g/dL (ref 13.0–17.0)
Immature Granulocytes: 0 %
Lymphocytes Relative: 18 %
Lymphs Abs: 1.7 10*3/uL (ref 0.7–4.0)
MCH: 30.8 pg (ref 26.0–34.0)
MCHC: 32.6 g/dL (ref 30.0–36.0)
MCV: 94.4 fL (ref 80.0–100.0)
Monocytes Absolute: 0.8 10*3/uL (ref 0.1–1.0)
Monocytes Relative: 8 %
Neutro Abs: 6.5 10*3/uL (ref 1.7–7.7)
Neutrophils Relative %: 72 %
Platelets: 252 10*3/uL (ref 150–400)
RBC: 4.84 MIL/uL (ref 4.22–5.81)
RDW: 12.3 % (ref 11.5–15.5)
WBC: 9.1 10*3/uL (ref 4.0–10.5)
nRBC: 0 % (ref 0.0–0.2)

## 2022-12-11 LAB — COMPREHENSIVE METABOLIC PANEL
ALT: 35 U/L (ref 0–44)
AST: 23 U/L (ref 15–41)
Albumin: 3.7 g/dL (ref 3.5–5.0)
Alkaline Phosphatase: 67 U/L (ref 38–126)
Anion gap: 9 (ref 5–15)
BUN: 17 mg/dL (ref 6–20)
CO2: 20 mmol/L — ABNORMAL LOW (ref 22–32)
Calcium: 9 mg/dL (ref 8.9–10.3)
Chloride: 108 mmol/L (ref 98–111)
Creatinine, Ser: 1.29 mg/dL — ABNORMAL HIGH (ref 0.61–1.24)
GFR, Estimated: >60 mL/min (ref >60)
Glucose, Bld: 104 mg/dL — ABNORMAL HIGH (ref 70–99)
Potassium: 4.3 mmol/L (ref 3.5–5.1)
Sodium: 137 mmol/L (ref 135–145)
Total Bilirubin: 0.7 mg/dL (ref 0.3–1.2)
Total Protein: 6.9 g/dL (ref 6.5–8.1)

## 2022-12-11 LAB — TROPONIN I (HIGH SENSITIVITY)
Troponin I (High Sensitivity): 5 ng/L (ref <18)
Troponin I (High Sensitivity): 6 ng/L (ref <18)

## 2022-12-11 NOTE — ED Provider Notes (Signed)
[place] EMERGENCY DEPARTMENT AT [facility] Provider Note   CSN: [id number] Arrival date & time: 12/11/22  1618     History  Chief Complaint  Patient presents with   Chest Pain    Edward Mendoza is a 52 y.o. male.  HPI 52 year old male with a history of prior STEMI with cardiac arrest presents with chest pain.  Patient states that he started feeling like he was having extra burping 2 days ago.  Yesterday and today has been having some on and off chest discomfort.  Feels like indigestion to him but he also felt like he was having indigestion prior to his cardiac arrest.  He does not feel he is having shortness of breath.  The pain does not radiate.  He feels like he can reproduce some soreness in his chest but he also has been able to do that for a long time.  He did state that he at some times he would get clammy in his hands with this discomfort.  The pain seems to be worse when eating and he has not noticed any exertional component.  He took 4 baby aspirin and over an hour took 2 different nitroglycerin.  He feels like they might of helped.  Right now he has no discomfort.  Home Medications Prior to Admission medications   Medication Sig Start Date End Date Taking? Authorizing Provider  apixaban (ELIQUIS) 5 MG TABS tablet Take 1 tablet (5 mg total) by mouth 2 (two) times daily. 10/13/22   Marykay Lex, MD  carvedilol (COREG) 3.125 MG tablet Take 1 tablet (3.125 mg total) by mouth 2 (two) times daily with a meal. 11/02/22   Bensimhon, Bevelyn Buckles, MD  citalopram (CELEXA) 10 MG tablet Take 1 tablet (10 mg total) by mouth daily. Please call [phone] to schedule a November appointment with Dr. Bryan Lemma for future refills. Thank you. 10/08/22   Marykay Lex, MD  clopidogrel (PLAVIX) 75 MG tablet Take 1 tablet (75 mg total) by mouth daily. 07/12/22   Marykay Lex, MD  Coenzyme Q-10 200 MG CAPS Take 1 capsule by mouth daily at 6 (six) AM.    [provider]   dapagliflozin propanediol (FARXIGA) 10 MG TABS tablet TAKE ONE TABLET (10MG  TOTAL) BY MOUTH DAILY BEFORE BREAKFAST 09/10/22   Bensimhon, Bevelyn Buckles, MD  Evolocumab (REPATHA SURECLICK) 140 MG/ML SOAJ Inject 1 mL into the skin every 14 (fourteen) days. 01/22/22   Marykay Lex, MD  losartan (COZAAR) 25 MG tablet Take 1 tablet (25 mg total) by mouth at bedtime. 06/14/22   Marykay Lex, MD  nitroGLYCERIN (NITROSTAT) 0.4 MG SL tablet DISSOLVE 1 TABLET UNDER TONGUE EVERY 5 MINUTES AS NEEDED FOR CHEST PAIN 07/28/22   Marykay Lex, MD  pantoprazole (PROTONIX) 40 MG tablet Take 1 tablet (40 mg total) by mouth daily. 05/31/22   Marykay Lex, MD  rosuvastatin (CRESTOR) 40 MG tablet Take 1 tablet (40 mg total) by mouth daily. 07/12/22 07/07/23  Marykay Lex, MD  spironolactone (ALDACTONE) 25 MG tablet Take 1 tablet (25 mg total) by mouth daily. 07/12/22   Marykay Lex, MD      Allergies    Ezetimibe    Review of Systems   Review of Systems  Respiratory:  Negative for shortness of breath.   Cardiovascular:  Positive for chest pain.  Gastrointestinal:  Negative for abdominal pain.    Physical Exam Updated Vital Signs BP 118/77   Pulse 84  Temp 99.1 F (37.3 C) (Oral)   Resp 12   Ht 6\' 1"  (1.854 m)   Wt 110.2 kg   SpO2 96%   BMI 32.06 kg/m  Physical Exam Vitals and nursing note reviewed.  Constitutional:      General: He is not in acute distress.    Appearance: He is well-developed. He is not ill-appearing or diaphoretic.  HENT:     Head: Normocephalic and atraumatic.  Cardiovascular:     Rate and Rhythm: Normal rate and regular rhythm.     Heart sounds: Normal heart sounds.  Pulmonary:     Effort: Pulmonary effort is normal.     Breath sounds: Normal breath sounds.  Chest:     Chest wall: No tenderness.  Abdominal:     Palpations: Abdomen is soft.     Tenderness: There is no abdominal tenderness.  Musculoskeletal:     Left lower leg: No edema.  Skin:    General:  Skin is warm and dry.  Neurological:     Mental Status: He is alert.    ED Results / Procedures / Treatments   Labs (all labs ordered are listed, but only abnormal results are displayed) Labs Reviewed  COMPREHENSIVE METABOLIC PANEL - Abnormal; Notable for the following components:      Result Value   CO2 20 (*)    Glucose, Bld 104 (*)    Creatinine, Ser 1.29 (*)    All other components within normal limits  CBC WITH DIFFERENTIAL/PLATELET  TROPONIN I (HIGH SENSITIVITY)  TROPONIN I (HIGH SENSITIVITY)    EKG EKG Interpretation Date/Time:  Saturday December 11 2022 17:29:21 EDT Ventricular Rate:  89 PR Interval:  171 QRS Duration:  90 QT Interval:  361 QTC Calculation: 440 R Axis:   18  Text Interpretation: Sinus rhythm Probable left atrial enlargement Anteroseptal infarct, age indeterminate similar to earlier in the day Confirmed by Pricilla Loveless ([id number]) on 12/11/2022 5:57:42 PM  Radiology DG Chest 2 View  Result Date: 12/11/2022 CLINICAL DATA:  Chest discomfort.  Previous cardiac arrest. EXAM: CHEST - 2 VIEW COMPARISON:  05/27/2020 FINDINGS: The heart size and mediastinal contours are within normal limits. Both lungs are clear. The visualized skeletal structures are unremarkable. IMPRESSION: No active cardiopulmonary disease. Electronically Signed   By: Danae Orleans M.D.   On: 12/11/2022 17:08    Procedures Procedures    Medications Ordered in ED Medications - No data to display  ED Course/ Medical Decision Making/ A&P                                 Medical Decision Making Amount and/or Complexity of Data Reviewed Independent Historian: spouse Labs:     Details: Normal troponins x 2. Radiology: ordered and independent interpretation performed.    Details: No CHF ECG/medicine tests: ordered and independent interpretation performed.    Details: No ischemia.  Repeat is unchanged.   Patient's chest pain is atypical.  There is no exertional component despite him  doing multiple exertional type activities over these last couple days.  ECGs are unchanged in the emergency department and showed no acute ischemia.  Troponins are negative x 2.  I did discuss with cardiology who saw the patient and we discussed the case (Dr. Lendell Caprice).  This seems atypical enough that he can follow-up as an outpatient with cardiology.  Patient is okay with this plan.  Otherwise, low suspicion that this is ACS,  PE, dissection, etc.  Will discharge home with return precautions.        Final Clinical Impression(s) / ED Diagnoses Final diagnoses:  Nonspecific chest pain    Rx / DC Orders ED Discharge Orders          Ordered    Ambulatory referral to Cardiology       Comments: If you have not heard from the Cardiology office within the next 72 hours please call [phone].   12/11/22 2725              Pricilla Loveless, MD 12/11/22 1941

## 2022-12-11 NOTE — Discharge Instructions (Signed)
Follow-up closely with cardiologist.  Continue your medicines as prescribed.  If you develop new or worsening chest pain, shortness of breath, dizziness, trouble breathing, or any other new/concerning symptoms then return to the ER or call 911.

## 2022-12-11 NOTE — ED Notes (Signed)
Pt took 324 asa and 2 sl nitroglycerin with relief of chest pain.

## 2022-12-11 NOTE — ED Triage Notes (Signed)
Reports chest pain started 1-2 days ago but has CPR 3 years ago with LAD occlusion.  Denies any other symptoms.  Pain is reproducible.

## 2022-12-11 NOTE — Consult Note (Signed)
Cardiology Consultation   Patient ID: EXODUS WHITCHURCH MRN: [id number]; DOB: 10-11-1970  Admit date: 12/11/2022 Date of Consult: 12/11/2022  PCP:  Lise Auer, MD   [facility] Providers Cardiologist:  Bryan Lemma, MD  Cardiology APP:  Ronney Asters, NP  Advanced Heart Failure:  Arvilla Meres, MD       Patient Profile:   CAYDEN CORBIT is a 52 y.o. male with a hx of STEMI c/b VF arrest s/p 4 rounds of CPR w/ ROSC s/p PCI to pLAD (07/2019), ICM (EF = 35-40%), HLD, prior CVAs, prior tobacco use, and Afib/AFlutter (on Eliquis) who is being seen 12/11/2022 for the evaluation of atypical chest pain at the request of Dr. Criss Alvine.  History of Present Illness:   Mr. Harvard suffered an LAD STEMI and associated VF arrest in 2021. He reports that prior to this event, he had persistent indigestion and SOB within the days leading up to the STEMI. He underwent successful PCI to his LAD and has since followed up with his [facility] cardiologists. Over the last few days the patient has experienced increased belching. He states that he has been belching more often than usual which has concerned him. This is despite him taking his PPI. He does indorse an increase in the consumption of fatty foods over this same time period such as a cheeseburger and fried ocra. This frequent belching is different than the indigestion that he had prior to his MI. He also notes having intermittent sharp chest pains across his chest that are fleeting and worsened by palpation and taking a deep breath in. They are mild 1/10. He denies SOB, DOE, PND, orthopnea, palpitations, swelling, weakness, syncope, presyncope and exercise intolerance.  Notably the patient is very active and has no limitations. 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 He has been doing frequent yard work such as cutting and lifting trees without any worsening of his symptoms. He denies active tobacco use, ETOH or illicit drug  use. He states that since his cardiac arrest he has anxiety about something like this happening to him again. Since he was having so much belching and anxiety about his heart, he decided to come in to get evaluated. He took two SLNGTs, but he is unsure if they helped or not since he wasn't having active chest pain.   In the ED his VS were all stable. Labs notable for troponin 5 -> 6. ECG showed anterior Q waves, but is unchanged from prior. CMP notable for a sCr 1.29 but otherwise unremarkable. CXR without pulmonary process.    Past Medical History:  Diagnosis Date   Acute ST elevation myocardial infarction (STEMI) involving left anterior descending coronary artery (HCC)    Presented as VF arrest -> 45 minutes CPR with ROSC => Ant STEMI: Cath = 95% thrombotic/hazy prox-mid LAD -> DES PCI. ->  Severe Ischemic Cardiomyopathy (EF improved from 15% to 35-40%).   Cardiac arrest with ventricular fibrillation (HCC) 07/11/2019   Chronic combined systolic and diastolic CHF, NYHA class 1 (HCC) 07/2019   Initial EF of 50% improved to 35-40%.  Class I-II CHF   History of multiple strokes 07/22/2019   Multiple CVAs noted on MRI post CARDIAC ARREST-prolonged CPR (innumerable punctate acute infarctions scattered throughout the cerebellum and s bilateral cerebellar hemispheres most consistent with micro emboli) no residual symptoms.   Hyperlipidemia with target LDL less than 70 07/22/2019   Ischemic cardiomyopathy -> EF ~35-40% by  CMRI 07/11/2019   following anterior MI and cardiac arrest-initial EF 15% improved to 35-40% 3 months post PCI.   Paroxysmal atrial fibrillation/a atrial flutter ->  in setting of post MI/cardiac arrest 07/18/2019   Had PAF-flutter following rewarming from [place] after cardiac arrest: TEE DCCV on 07/21/2019   Single vessel coronary disease 07/11/2019   Anterior STEMI complicated by VF arrest.  1V CAD - 95-99% p-mLAD => DES PCI: Resolute Onyx DES 3.5 x 18 (post-dilated to 4.0 mm)    Smoker 07/13/2019    Past Surgical History:  Procedure Laterality Date   ARTERIAL LINE INSERTION N/A 07/11/2019   Procedure: ARTERIAL LINE INSERTION;  Surgeon: Marykay Lex, MD;  Location: [facility] INVASIVE CV LAB;  Service: Cardiovascular;  Laterality: N/A;   CARDIAC MRI  01/11/2020   LVEF ~39%. Akinesis of basal-mid inferoseptal, anteroseptal & apical anterior-septal-inferior walls as well as tru apex.  Paradoxical septal WM. 75-100% late enhancement - FIXED INFARCT. Normal RV function. Mod LA dilation. Mild-mod MR & TR.   CARDIOVERSION N/A 07/21/2019   Procedure: CARDIOVERSION;  Surgeon: Dolores Patty, MD;  Location: [facility] ENDOSCOPY;  Service: Cardiovascular;; TTE - DCCV: Atrial flutter-restored sinus rhythm   CORONARY STENT INTERVENTION N/A 07/11/2019   Procedure: CORONARY STENT INTERVENTION;  Surgeon: Marykay Lex, MD;  Location: [facility] INVASIVE CV LAB;  Service: Cardiovascular;;  proximal-mid LAD 95% hazy lesion (DES PCI Resolute Onyx 3.5 mm 18 mm-at 4.0 mm),   CORONARY/GRAFT ACUTE MI REVASCULARIZATION N/A 07/11/2019   Procedure: Coronary/Graft Acute MI Revascularization;  Surgeon: Marykay Lex, MD;  Location: [facility] INVASIVE CV LAB;  DES PCI of p-m LAD 95-99%.   RIGHT/LEFT HEART CATH AND CORONARY ANGIOGRAPHY N/A 07/11/2019   Procedure: RIGHT/LEFT HEART CATH AND CORONARY ANGIOGRAPHY;  Surgeon: Marykay Lex, MD;  Location: [facility] INVASIVE CV LAB:: Ant STEMI/Cardiac Arrest:  p-mLAD 95-99% thrombotic stenosis --> DES PCI. EF ~35%. Normal RHC Pressures. PCWP ~20 mmHg with LVEDP ~18 mmHg.  CO-CI 4.77 - 2.04 (no MCS placed)    TEE WITHOUT CARDIOVERSION N/A 07/21/2019   Procedure: TRANSESOPHAGEAL ECHOCARDIOGRAM (TEE) W/ CARDIOVERSION;  Surgeon: Dolores Patty, MD;  Location: [facility] ENDOSCOPY;;  EF 30 to 35%.  Mid-apical anterior and apical hypo to akinesis.  No LAA. ->  Successful DCCV of Atrial Flutter.   TRANSTHORACIC ECHOCARDIOGRAM  07/2019   a) Initial Post PCI-postarrest: EF~15%.  Severely  reduced with global HK.  Moderate LVH.  Mildly dilated IVC.; b) 07/14/2019: EF 20 to 25%.  Entire anteroseptal, anterior and apical wall severe hypokinesis with moderate HK of mid-anterior inferior wall.  (Improved EF).   TRANSTHORACIC ECHOCARDIOGRAM  10/24/2019   EF 35 to 40%.  Moderately dilated LV indeterminate filling pressures.  Akinetic mid anteroseptal wall as well as apical anterior and apical septal wall.  Otherwise normal valves and remaining chambers are normal.  History of PFO but no shunting.   TRANSTHORACIC ECHOCARDIOGRAM  08/2020   EF ~30-35% (~ slightly worse than 10/2019).     Home Medications:  Prior to Admission medications   Medication Sig Start Date End Date Taking? Authorizing Provider  apixaban (ELIQUIS) 5 MG TABS tablet Take 1 tablet (5 mg total) by mouth 2 (two) times daily. 10/13/22   Marykay Lex, MD  carvedilol (COREG) 3.125 MG tablet Take 1 tablet (3.125 mg total) by mouth 2 (two) times daily with a meal. 11/02/22   Bensimhon, Bevelyn Buckles, MD  citalopram (CELEXA) 10 MG tablet Take 1 tablet (10 mg total) by mouth daily.  Please call [phone] to schedule a November appointment with Dr. Bryan Lemma for future refills. Thank you. 10/08/22   Marykay Lex, MD  clopidogrel (PLAVIX) 75 MG tablet Take 1 tablet (75 mg total) by mouth daily. 07/12/22   Marykay Lex, MD  Coenzyme Q-10 200 MG CAPS Take 1 capsule by mouth daily at 6 (six) AM.    [provider]  dapagliflozin propanediol (FARXIGA) 10 MG TABS tablet TAKE ONE TABLET (10MG  TOTAL) BY MOUTH DAILY BEFORE BREAKFAST 09/10/22   Bensimhon, Bevelyn Buckles, MD  Evolocumab (REPATHA SURECLICK) 140 MG/ML SOAJ Inject 1 mL into the skin every 14 (fourteen) days. 01/22/22   Marykay Lex, MD  losartan (COZAAR) 25 MG tablet Take 1 tablet (25 mg total) by mouth at bedtime. 06/14/22   Marykay Lex, MD  nitroGLYCERIN (NITROSTAT) 0.4 MG SL tablet DISSOLVE 1 TABLET UNDER TONGUE EVERY 5 MINUTES AS NEEDED FOR CHEST PAIN 07/28/22    Marykay Lex, MD  pantoprazole (PROTONIX) 40 MG tablet Take 1 tablet (40 mg total) by mouth daily. 05/31/22   Marykay Lex, MD  rosuvastatin (CRESTOR) 40 MG tablet Take 1 tablet (40 mg total) by mouth daily. 07/12/22 07/07/23  Marykay Lex, MD  spironolactone (ALDACTONE) 25 MG tablet Take 1 tablet (25 mg total) by mouth daily. 07/12/22   Marykay Lex, MD    Inpatient Medications: Scheduled Meds:  Continuous Infusions:  PRN Meds:   Allergies:    Allergies  Allergen Reactions   Ezetimibe Other (See Comments)    Arms locking up    Social History:   Social History   Socioeconomic History   Marital status: Married    Spouse name: Not on file   Number of children: Not on file   Years of education: Not on file   Highest education level: Not on file  Occupational History   Occupation: Copy    Employer: [employer]  Tobacco Use   Smoking status: Former    Current packs/day: 0.00    Average packs/day: 1 pack/day for 21.2 years (21.2 ttl pk-yrs)    Types: Cigarettes    Start date: 2000    Quit date: 07/11/2019    Years since quitting: 3.4   Smokeless tobacco: Never  Substance and Sexual Activity   Alcohol use: Not Currently   Drug use: Not Currently   Sexual activity: Yes  Other Topics Concern   Not on file  Social History Narrative   Not on file   Social Determinants of Health   Financial Resource Strain: Not on file  Food Insecurity: Not on file  Transportation Needs: No Transportation Needs (08/07/2019)   PRAPARE - Administrator, Civil Service (Medical): No    Lack of Transportation (Non-Medical): No  Physical Activity: Sufficiently Active (08/07/2019)   Exercise Vital Sign    Days of Exercise per Week: 7 days    Minutes of Exercise per Session: 30 min  Stress: Not on file  Social Connections: Not on file  Intimate Partner Violence: Not on file    Family History:    Family History  Family history unknown: Yes     ROS:   Please see the history of present illness.  All other ROS reviewed and negative.     Physical Exam/Data:   Vitals:   12/11/22 1730 12/11/22 1745 12/11/22 1800 12/11/22 1830  BP: 125/82 111/79 115/78 (!) 129/91  Pulse: 84 83 84 87  Resp: 13 17 11  (!)  21  Temp:      TempSrc:      SpO2: 96% 96% 98% 97%  Weight:      Height:       No intake or output data in the 24 hours ending 12/11/22 1856    12/11/2022    4:27 PM 07/30/2022   10:48 AM 06/29/2022    9:47 AM  Last 3 Weights  Weight (lbs) 243 lb 243 lb 3.2 oz 244 lb 9.6 oz  Weight (kg) 110.224 kg 110.315 kg 110.95 kg     Body mass index is 32.06 kg/m.  General:  Well nourished, well developed, in no acute distress HEENT: normal Neck: no JVD Vascular: No carotid bruits; Distal pulses 2+ bilaterally Cardiac:  normal S1, S2; RRR; no murmur  Lungs:  clear to auscultation bilaterally, no wheezing, rhonchi or rales  Abd: soft, nontender, no hepatomegaly  Ext: no edema Musculoskeletal:  No deformities, BUE and BLE strength normal and equal Skin: warm and dry  Neuro:  CNs 2-12 intact, no focal abnormalities noted Psych:  Normal affect   EKG:  The EKG was personally reviewed and demonstrates:     Telemetry:  Telemetry was personally reviewed and demonstrates:  NSR  Relevant CV Studies:   TTE 07/14/22:  IMPRESSIONS     1. Left ventricular ejection fraction, by estimation, is 35 to 40%. The  left ventricle has moderately decreased function. The left ventricle  demonstrates regional wall motion abnormalities (see scoring  diagram/findings for description). The left  ventricular internal cavity size was moderately dilated. Left ventricular  diastolic parameters are consistent with Grade I diastolic dysfunction  (impaired relaxation).   2. Right ventricular systolic function is normal. The right ventricular  size is normal. Tricuspid regurgitation signal is inadequate for assessing  PA pressure.   3. The mitral valve is  normal in structure. No evidence of mitral valve  regurgitation. No evidence of mitral stenosis.   4. The aortic valve is tricuspid. Aortic valve regurgitation is not  visualized. No aortic stenosis is present.   5. Aortic Normal DTA.   6. The inferior vena cava is normal in size with greater than 50%  respiratory variability, suggesting right atrial pressure of 3 mmHg.   cMRI 01/2020:  FINDINGS: 1. Severely dilated left ventricle with normal wall thickness and moderately decreased systolic function (LVEF = 39%). There is akinesis of the basal and mid inferoseptal, anteroseptal, mid and apical anterior, apical septal and inferior walls and of the true apex. Paradoxical septal motion. No apical thrombus. There is transmural late gadolinium enhancement (75-100% with no chance of recovery) in the in the basal and mid inferoseptal, anteroseptal, mid and apical anterior walls, apical septal and inferior walls and in the true apex.  LHC 07/11/19:  SUMMARY Anterior STEMI complicated by VF arrest.   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  Otherwise minimal disease elsewhere. Successful DES PCI of proximal LAD using resolute Onyx DES 3.5 x 18 mm postdilated to 4.0 mm. Systolic hypotension with borderline cardiogenic shock with systolic pressures in the 90s over 60s requiring Levophed at 10 mcg/min. -->  Cardiac output-index 4.77-2.04; cardiac power 0.8. No evidence of pulmonary hypertension  Laboratory Data:  High Sensitivity Troponin:   Recent Labs  Lab 12/11/22 1638  TROPONINIHS 5     Chemistry Recent Labs  Lab 12/11/22 1638  NA 137  K 4.3  CL 108  CO2 20*  GLUCOSE 104*  BUN 17  CREATININE 1.29*  CALCIUM 9.0  GFRNONAA >60  ANIONGAP 9    Recent Labs  Lab 12/11/22 1638  PROT 6.9  ALBUMIN 3.7  AST 23  ALT 35  ALKPHOS 67  BILITOT 0.7   Lipids No results for input(s): "CHOL", "TRIG", "HDL", "LABVLDL", "LDLCALC", "CHOLHDL"  in the last 168 hours.  Hematology Recent Labs  Lab 12/11/22 1638  WBC 9.1  RBC 4.84  HGB 14.9  HCT 45.7  MCV 94.4  MCH 30.8  MCHC 32.6  RDW 12.3  PLT 252   Thyroid No results for input(s): "TSH", "FREET4" in the last 168 hours.  BNPNo results for input(s): "BNP", "PROBNP" in the last 168 hours.  DDimer No results for input(s): "DDIMER" in the last 168 hours.   Radiology/Studies:  DG Chest 2 View  Result Date: 12/11/2022 CLINICAL DATA:  Chest discomfort.  Previous cardiac arrest. EXAM: CHEST - 2 VIEW COMPARISON:  05/27/2020 FINDINGS: The heart size and mediastinal contours are within normal limits. Both lungs are clear. The visualized skeletal structures are unremarkable. IMPRESSION: No active cardiopulmonary disease. Electronically Signed   By: Danae Orleans M.D.   On: 12/11/2022 17:08     Assessment and Plan:   OBADIAH SICONOLFI is a 52 y.o. male with a hx of STEMI c/b VF arrest s/p 4 rounds of CPR w/ ROSC s/p PCI to pLAD (07/2019), ICM (EF = 35-40%), HLD, prior CVAs, prior tobacco use, and Afib/AFlutter (on Eliquis) who is being seen 12/11/2022 for the evaluation of atypical chest pain at the request of Dr. Criss Alvine.  #MSK Chest Pain #Mild Indigestion ::Pt reports having frequent belching over the past few days and chest wall tenderness to palpation. Troponins are negative and ECG is unchanged. No active chest pain and no chest pain with heavy exertion. His HEART Score = 3 which is low risk. His symptoms appear to be noncardiac and more so GI related. I understand that his prior MI was preceded by indigestion and SOB, but those symptoms were unrelenting for days. His current symptoms come and go with increased fatty food intake. I gave him the option of being discharged with close cards follow up vs staying overnight for observation. He elected to be discharged home with close follow up. I gave him strict ED return precautions if he develops worsening chest pain, SOB or concerning  symptoms. -continue home protonix -GERD dietary modifications -I told him he can take TUMS prn as well  #CAD s/p PCI to LAD #VF Arrest #HLD ::As described above his belching and chest pain sound non cardiac. Plus his LHC in 2021 was largely single vessel disease and his LDL is <55. If his symptoms of belching persist can consider outpatient stress testing at that time. Otherwise continue his current treatment. -continue plavix, crestor, repatha, prn SLNGT  #ICM -continue GDMT   Risk Assessment/Risk Scores:        New York Heart Association (NYHA) Functional Class NYHA Class I  CHA2DS2-VASc Score = 3  This indicates a 3.2% annual risk of stroke. The patient's score is based upon: CHF, prior CVA   [facility] will sign off.   Medication Recommendations:  Keep all current medications Other recommendations (labs, testing, etc):  f/u BMP as outpatient for creatinine Follow up as an outpatient:  close cardiology follow up  For questions or updates, please contact [facility] Please consult [email or web address] for contact info under    Signed, Karl Ito, MD  12/11/2022 6:56 PM

## 2022-12-13 ENCOUNTER — Encounter: Payer: Self-pay | Admitting: Cardiology

## 2022-12-13 ENCOUNTER — Encounter (HOSPITAL_COMMUNITY): Payer: Self-pay | Admitting: Internal Medicine

## 2022-12-13 NOTE — Progress Notes (Unsigned)
Cardiology Office Note:  .   Date:  12/14/2022  ID:  RJAY KIRSHENBAUM, DOB 06-Dec-1970, MRN [id number] PCP: Lise Auer, MD  [facility] Providers Cardiologist:  Bryan Lemma, MD Cardiology APP:  Ronney Asters, NP  Advanced Heart Failure:  Arvilla Meres, MD    History of Present Illness: .   Edward Mendoza is a 52 y.o. male with past medical history of CAD, acute anterior STEMI in 07/11/19 complicated by VF arrest s/p PCI to pLAD, ICM (EF 35-40%), HLD, prior CVAs, prior tobacco use and post MI Afib/Aflutter (on Eliquis) who is being seen today for ED follow up regarding chest pain.   In 07/2019 he suffered a LAD STEMI and associated V-fib arrest.  He had a total of 40 to 45 minutes of ACLS before ROSC.  LHC showed 99% proximal LAD occlusion treated with PCI/DES to LAD.  No other significant disease. Echo 07/11/2019 indicated EF 15%, RV systolic function mildly reduced.  He was started on DAPT with aspirin and Brilinta.  Brilinta was stopped and Plavix was started with the addition of Eliquis for atrial flutter. DCCV 07/21/19 with conversion to NSR.  In 01/2021 he wore a cardiac monitor that indicated predominant rhythm was sinus rhythm, he had 3 runs of SVT with the fastest interval lasting 5 beats x-ray of 182 and the longest lasting 9 beats with an average of 146 bpm. Last echo on 07/09/2022 indicated EF 35 to 40%.  On 12/11/2022 he presented to the emergency room for chest pain.  On chart review patient stated that he started feeling he was having extra pain 2 days prior to reporting to the emergency room.  He noted intermittent chest pain the day of his visit and day prior to his visit.  He noted that his chest pain was intermittent sharp across his chest that were fleeting and worsened by palpation and taking a deep breath in.  He reported mild pain 1 out of 10. Troponin 5>>6, ECG showed anterior Q waves, but is unchanged from prior.   Today he presents for follow up regarding recent ED  visit. He reports he is doing better overall. Questions if part of his discomfort is related to anxiety. He notes improvement in "belching" since ED discharge. He has reduced foods such as onions and sausage with improvement. Reports that when he has fleeting chest discomfort he can press into the area and is able to reproduce the pain.  Prior to the last week he had had no recurrent symptoms since January. He states it does not feel the same as his anginal equivalent of ongoing indigestion that he felt prior to his STEMI in 07/2019. 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 He remains very active, walked three miles yesterday with no anginal symptoms.   ROS: Today he denies chest pain, shortness of breath, lower extremity edema, palpitations, melena, hematuria, hemoptysis, diaphoresis, weakness, presyncope, syncope, orthopnea, and PND.   Studies Reviewed: .       Cardiac Studies & Procedures   CARDIAC CATHETERIZATION  CARDIAC CATHETERIZATION 07/11/2019  Narrative Images from the original result were not included.   There is moderate left ventricular systolic dysfunction.  LV end diastolic pressure is moderately elevated.  The left ventricular ejection fraction is 35-45% by visual estimate.  There is no aortic valve stenosis.  Prox LAD to Mid LAD lesion is 95% stenosed.  Post intervention, there is a 0%  residual stenosis.  A drug-eluting stent was successfully placed using a STENT RESOLUTE ONYX 3.5X18.  SUMMARY  Anterior STEMI complicated by VF arrest.  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  Otherwise minimal disease elsewhere.  Successful DES PCI of proximal LAD using resolute Onyx DES 3.5 x 18 mm postdilated to 4.0 mm.  Systolic hypotension with borderline cardiogenic shock with systolic pressures in the 90s over 60s requiring Levophed at 10 mcg/min. -->  Cardiac  output-index 4.77-2.04; cardiac power 0.8.  No evidence of pulmonary hypertension  RECOMMENDATIONS  Concern for possible seizure activity following PCI. ->  Discussed with PCCM, will send for head CT following cath and have neurology consult  We will continue IV Kengreal until OG tube placed and able to receive Brilinta.  Have discussed with critical care & Dr. Gala Romney ([facility]), plan to avoid hypothermia protocol given significant movement upon arrival.  Appreciate PCCM assistance with sedation  No blood pressure room currently for GDMT with beta-blocker.  Currently on Levophed-wean as tolerated.  Obtain 2D echocardiogram    Bryan Lemma, M.D., M.S. Interventional Cardiologist   [address] [place], [place] [address]  Findings Coronary Findings Diagnostic  Dominance: Right  Left Main Vessel is large.  Left Anterior Descending Prox LAD to Mid LAD lesion is 95% stenosed. Vessel is the culprit lesion. The lesion is irregular, thrombotic and ulcerative.  First Diagonal Branch Vessel is moderate in size.  First Septal Branch Vessel is moderate in size.  Ramus Intermedius Vessel is small.  Left Circumflex Vessel is large.  First Obtuse Marginal Branch Vessel is small in size.  Right Coronary Artery Vessel is large.  Acute Marginal Branch Vessel is small in size.  Right Ventricular Branch Vessel is small in size.  Intervention  Prox LAD to Mid LAD lesion Stent Lesion length:  13 mm. CATH VISTA GUIDE 6FR XBLAD3.5 guide catheter was inserted. Lesion crossed with guidewire using a WIRE ASAHI PROWATER 180CM. Pre-stent angioplasty was performed using a BALLOON SAPPHIRE 2.5X15. Maximum pressure:  12 atm. Inflation time:  20 sec. A drug-eluting stent was successfully placed using a STENT RESOLUTE ONYX 3.5X18. Maximum pressure: 16 atm. Inflation time: 30 sec. Minimum lumen area:  3.8 mm. Stent strut is well apposed. Post-stent angioplasty  was performed using a BALLOON SAPPHIRE [place] 3.75X10. Maximum pressure:  18 atm. Inflation time:  20 sec. Post-Intervention Lesion Assessment The intervention was successful. Pre-interventional TIMI flow is 1. Post-intervention TIMI flow is 3. Treated lesion length:  15 mm. No complications occurred at this lesion. There is a 0% residual stenosis post intervention.     ECHOCARDIOGRAM  ECHOCARDIOGRAM COMPLETE 07/14/2022  Narrative ECHOCARDIOGRAM REPORT    Patient Name:   LINDWOOD DEBUHR Date of Exam: 07/14/2022 Medical Rec #:  [id number]      Height:       73.0 in Accession #:    [id number]     Weight:       244.6 lb Date of Birth:  05-25-1970      BSA:          2.344 m Patient Age:    51 years       BP:           118/80 mmHg Patient Gender: M              HR:           88 bpm. Exam Location:  [facility]  Procedure: 2D Echo, Cardiac Doppler,  Color Doppler, Strain Analysis and Intracardiac Opacification Agent  Indications:    HFrEF (heart failure with reduced ejection fraction) (HCC) [I50.20]  History:        Patient has prior history of Echocardiogram examinations, most recent 08/13/2021. Cardiomegaly, Previous Myocardial Infarction and CAD, Stroke, Arrythmias:Atrial Fibrillation; Risk Factors:Dyslipidemia.  Sonographer:    Louie Boston RDCS Referring Phys: [id number] TESSA N CONTE   Sonographer Comments: Suboptimal apical window. IMPRESSIONS   1. Left ventricular ejection fraction, by estimation, is 35 to 40%. The left ventricle has moderately decreased function. The left ventricle demonstrates regional wall motion abnormalities (see scoring diagram/findings for description). The left ventricular internal cavity size was moderately dilated. Left ventricular diastolic parameters are consistent with Grade I diastolic dysfunction (impaired relaxation). 2. Right ventricular systolic function is normal. The right ventricular size is normal. Tricuspid regurgitation signal is inadequate for  assessing PA pressure. 3. The mitral valve is normal in structure. No evidence of mitral valve regurgitation. No evidence of mitral stenosis. 4. The aortic valve is tricuspid. Aortic valve regurgitation is not visualized. No aortic stenosis is present. 5. Aortic Normal DTA. 6. The inferior vena cava is normal in size with greater than 50% respiratory variability, suggesting right atrial pressure of 3 mmHg.  FINDINGS Left Ventricle: Left ventricular ejection fraction, by estimation, is 35 to 40%. The left ventricle has moderately decreased function. The left ventricle demonstrates regional wall motion abnormalities. Definity contrast agent was given IV to delineate the left ventricular endocardial borders. The left ventricular internal cavity size was moderately dilated. There is no left ventricular hypertrophy. Left ventricular diastolic parameters are consistent with Grade I diastolic dysfunction (impaired relaxation). Normal left ventricular filling pressure.   LV Wall Scoring: The anterior septum, apical anterior segment, basal anterior segment, and apex are akinetic. The inferior septum, mid anterior segment, and basal inferior segment are hypokinetic. The entire lateral wall and mid and distal inferior wall are normal.  Right Ventricle: The right ventricular size is normal. No increase in right ventricular wall thickness. Right ventricular systolic function is normal. Tricuspid regurgitation signal is inadequate for assessing PA pressure. The tricuspid regurgitant velocity is 1.80 m/s, and with an assumed right atrial pressure of 3 mmHg, the estimated right ventricular systolic pressure is 16.0 mmHg.  Left Atrium: Left atrial size was normal in size.  Right Atrium: Right atrial size was normal in size.  Pericardium: There is no evidence of pericardial effusion.  Mitral Valve: The mitral valve is normal in structure. No evidence of mitral valve regurgitation. No evidence of mitral  valve stenosis.  Tricuspid Valve: The tricuspid valve is normal in structure. Tricuspid valve regurgitation is trivial. No evidence of tricuspid stenosis.  Aortic Valve: The aortic valve is tricuspid. Aortic valve regurgitation is not visualized. No aortic stenosis is present.  Pulmonic Valve: The pulmonic valve was normal in structure. Pulmonic valve regurgitation is not visualized. No evidence of pulmonic stenosis.  Aorta: The aortic root and ascending aorta are structurally normal, with no evidence of dilitation, the aortic arch was not well visualized and Normal DTA.  Venous: The inferior vena cava is normal in size with greater than 50% respiratory variability, suggesting right atrial pressure of 3 mmHg.  IAS/Shunts: No atrial level shunt detected by color flow Doppler.   LEFT VENTRICLE PLAX 2D LVIDd:         6.60 cm      Diastology LVIDs:         5.50 cm      LV  e' medial:    8.16 cm/s LV PW:         0.90 cm      LV E/e' medial:  8.2 LV IVS:        0.90 cm      LV e' lateral:   11.50 cm/s LVOT diam:     2.40 cm      LV E/e' lateral: 5.8 LV SV:         75 LV SV Index:   32 LVOT Area:     4.52 cm  LV Volumes (MOD) LV vol d, MOD A2C: 128.0 ml LV vol d, MOD A4C: 164.0 ml LV vol s, MOD A2C: 74.0 ml LV vol s, MOD A4C: 100.0 ml LV SV MOD A2C:     54.0 ml LV SV MOD A4C:     164.0 ml LV SV MOD BP:      60.2 ml  RIGHT VENTRICLE             IVC RV S prime:     15.00 cm/s  IVC diam: 1.60 cm TAPSE (M-mode): 2.7 cm  LEFT ATRIUM             Index        RIGHT ATRIUM           Index LA diam:        4.20 cm 1.79 cm/m   RA Area:     13.90 cm LA Vol (A2C):   55.6 ml 23.72 ml/m  RA Volume:   31.40 ml  13.40 ml/m LA Vol (A4C):   55.9 ml 23.85 ml/m LA Biplane Vol: 58.4 ml 24.92 ml/m AORTIC VALVE LVOT Vmax:   90.13 cm/s LVOT Vmean:  59.933 cm/s LVOT VTI:    0.167 m  AORTA Ao Root diam: 3.60 cm Ao Asc diam:  3.40 cm Ao Desc diam: 2.10 cm  MV E velocity: 66.60 cm/s   TRICUSPID VALVE MV A velocity: 70.20 cm/s  TR Peak grad:   13.0 mmHg MV E/A ratio:  0.95        TR Vmax:        180.00 cm/s  SHUNTS Systemic VTI:  0.17 m Systemic Diam: 2.40 cm  Norman Herrlich MD Electronically signed by Norman Herrlich MD Signature Date/Time: 07/14/2022/6:00:47 PM    Final   TEE  ECHO TEE 07/21/2019  Narrative TRANSESOPHOGEAL ECHO REPORT    Patient Name:   DAREN PLANO Date of Exam: 07/21/2019 Medical Rec #:  [id number]      Height:       73.0 in Accession #:    [id number]     Weight:       205.7 lb Date of Birth:  [birth year]      BSA:          2.177 m Patient Age:    48 years       BP:           111/71 mmHg Patient Gender: M              HR:           105 bpm. Exam Location:  Inpatient  Procedure: Transesophageal Echo, Cardiac Doppler and Color Doppler  Indications:     Atrial flutter 427.32/I48.92  History:         Patient has prior history of Echocardiogram examinations, most recent 07/14/2019. Previous Myocardial Infarction; Arrythmias:Atrial Fibrillation and Ventricular Fibrillation. STEMI.  Sonographer:     Ross Ludwig RDCS (AE)  Referring Phys:  [id number] BENSIM[place]ON Diagnosing Phys: Arvilla Meres MD  PROCEDURE: After discussion of the risks and benefits of a TEE, an informed consent was obtained from the patient. The transesophogeal probe was passed without difficulty through the esophogus of the patient. Sedation performed by different physician. The patient was monitored while under deep sedation. Anesthestetic sedation was provided intravenously by Anesthesiology: 199.95mg  of Propofol. Image quality was adequate. The patient developed no complications during the procedure. A successful direct current cardioversion was performed at 150 joules.  IMPRESSIONS   1. Left ventricular ejection fraction, by estimation, is 30 to 35%. The left ventricle has moderately decreased function. The left ventricle demonstrates regional wall motion  abnormalities (see scoring diagram/findings for description). There is akinesis of the left ventricular, mid-apical anterior wall and apical segment. 2. Right ventricular systolic function is normal. The right ventricular size is normal. 3. Left atrial size was moderately dilated. No left atrial/left atrial appendage thrombus was detected. 4. The mitral valve is normal in structure. Trivial mitral valve regurgitation. 5. The aortic valve is tricuspid. Aortic valve regurgitation is not visualized. 6. There is Severe (Grade IV) plaque involving the descending aorta. 7. Evidence of atrial level shunting detected by color flow Doppler. There is a small patent foramen ovale with predominantly left to right shunting across the atrial septum.  FINDINGS Left Ventricle: Left ventricular ejection fraction, by estimation, is 30 to 35%. The left ventricle has moderately decreased function. The left ventricle demonstrates regional wall motion abnormalities. The left ventricular internal cavity size was normal in size. There is no left ventricular hypertrophy.  Right Ventricle: The right ventricular size is normal. No increase in right ventricular wall thickness. Right ventricular systolic function is normal.  Left Atrium: Left atrial size was moderately dilated. No left atrial/left atrial appendage thrombus was detected.  Right Atrium: Right atrial size was normal in size.  Pericardium: A small pericardial effusion is present. The pericardial effusion is anterior to the right ventricle.  Mitral Valve: The mitral valve is normal in structure. Trivial mitral valve regurgitation.  Tricuspid Valve: The tricuspid valve is normal in structure. Tricuspid valve regurgitation is mild.  Aortic Valve: The aortic valve is tricuspid. Aortic valve regurgitation is not visualized.  Pulmonic Valve: The pulmonic valve was normal in structure. Pulmonic valve regurgitation is not visualized.  Aorta: The aortic root is  normal in size and structure. There is severe (Grade IV) plaque involving the descending aorta.  IAS/Shunts: The interatrial septum is aneurysmal. Evidence of atrial level shunting detected by color flow Doppler. A small patent foramen ovale is detected with predominantly left to right shunting across the atrial septum.  Arvilla Meres MD Electronically signed by Arvilla Meres MD Signature Date/Time: 09/08/2019/3:32:37 PM    Final   MONITORS  LONG TERM MONITOR (3-14 DAYS) 08/28/2020  Narrative Patch Wear Time:  14 days and 0 hours (2022-04-07T11:52:47-0400 to 2022-04-21T11:52:51-0400)  1. Sinus rhythm - avg HR of 70 bpm Rhythm. 2. Three runs of SVT occurred, the run with the fastest interval lasting 5 beats with a max rate of 182 bpm, the longest lasting 9 beats with an avg rate of 146 bpm. 3. Supraventricular Tachycardia was detected within +/- 45 seconds of symptomatic patient event(s). 4. Rare PACs and PVCs. 5. Most patient-triggered events associated with isolated PVCs.  Arvilla Meres, MD 11:03 PM    CARDIAC MRI  MR CARDIAC MORPHOLOGY W WO CONTRAST 01/11/2020  Narrative CLINICAL DATA:  52 year old male with h/o myocardial infarction, ischemic  cardiomyopathy, s/p VF arrest in 08/2019 with chronic systolic CHF who is being evaluated for consideration of ICD placement.  EXAM: CARDIAC MRI  TECHNIQUE: The patient was scanned on a 1.5 Tesla GE magnet. A dedicated cardiac coil was used. Functional imaging was done using Fiesta sequences. 2,3, and 4 chamber views were done to assess for RWMA's. Modified Simpson's rule using a short axis stack was used to calculate an ejection fraction on a dedicated work [employer]. The patient received 10 cc of Gadavist. After 10 minutes inversion recovery sequences were used to assess for infiltration and scar tissue.  CONTRAST:  10 cc  of Gadavist  FINDINGS: 1. Severely dilated left ventricle with normal  wall thickness and moderately decreased systolic function (LVEF = 39%). There is akinesis of the basal and mid inferoseptal, anteroseptal, mid and apical anterior, apical septal and inferior walls and of the true apex. Paradoxical septal motion. No apical thrombus. There is transmural late gadolinium enhancement (75-100% with no chance of recovery) in the in the basal and mid inferoseptal, anteroseptal, mid and apical anterior walls, apical septal and inferior walls and in the true apex.  LVEDD: 70 mm  LVESD: 55 mm  LVEDV: 256 ml  LVESV: 157 ml  SV: 99 ml  CO: 5.95 L/min  Myocardial mass: 155 g  2. Normal right ventricular size, thickness and systolic function (LVEF = 68%). There are no regional wall motion abnormalities.  3.  Moderately dilated left atrium. Normal right atrial size.  4. Normal size of the aortic root, ascending aorta. Mildly dilated pulmonary artery measuring 32 mm.  5. Mild to moderate mitral regurgitation, trivial tricuspid regurgitation.  6.  Normal pericardium.  No pericardial effusion.  IMPRESSION: 1. Severely dilated left ventricle with normal wall thickness and moderately decreased systolic function (LVEF = 39%). There is akinesis of the basal and mid inferoseptal, anteroseptal, mid and apical anterior, apical septal and inferior walls and of the true apex. Paradoxical septal motion. No apical thrombus. There is transmural late gadolinium enhancement (75-100% with no chance of recovery) in the in the basal and mid inferoseptal, anteroseptal, mid and apical anterior walls, apical septal and inferior walls and in the true apex.  2. Normal right ventricular size, thickness and systolic function (LVEF = 68%). There are no regional wall motion abnormalities.  3.  Moderately dilated left atrium.  4. Normal size of the aortic root, ascending aorta. Mildly dilated pulmonary artery measuring 32 mm.  5.  Mild to moderate mitral  regurgitation.   Electronically Signed By: Tobias Alexander On: 01/12/2020 13:39         Risk Assessment/Calculations:    CHA2DS2-VASc Score = 3   This indicates a 3.2% annual risk of stroke. The patient's score is based upon: CHF History: 1 HTN History: 1 Diabetes History: 0 Stroke History: 0 Vascular Disease History: 1 Age Score: 0 Gender Score: 0            Physical Exam:   VS:  BP 106/78   Pulse 87   Ht 6\' 1"  (1.854 m)   Wt 237 lb 6.4 oz (107.7 kg)   SpO2 97%   BMI 31.32 kg/m    Wt Readings from Last 3 Encounters:  12/14/22 237 lb 6.4 oz (107.7 kg)  12/11/22 243 lb (110.2 kg)  07/30/22 243 lb 3.2 oz (110.3 kg)    GEN: Well nourished, well developed in no acute distress NECK: No JVD; No carotid bruits CARDIAC: RRR, no murmurs, rubs,  gallops RESPIRATORY:  Clear to auscultation without rales, wheezing or rhonchi  ABDOMEN: Soft, non-tender, non-distended EXTREMITIES:  No edema; No deformity   ASSESSMENT AND PLAN: .   CAD/VF arrest: Anterior STEMI complicated by VF arrest in 07/2019 s/p PCI to LAD. Echocardiogram 07/09/2022 indicated EF 35 to 40%, LV with wall motion abnormalities, G1DD. Noted his prior anginal equivalent was ongoing indigestion, with no improvement over multiple days. He presented to ED on 12/11/22 with increased belching, he had an unremarkable cardiac workup, negative troponins x2 and EKG unchanged from prior. Today he notes overall improvement, he denies chest pain with exertion. 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 Prior to last week he had not had symptoms since January. Given his history and precordial pain will obtain PET stress, he is agreeable to this, see consent below. Reviewed ED precautions. Continue Eliquis, Coreg, Plavix, Farxiga, Repatha, Cozaar, Crestor, Aldactone.   Belching/Acid reflux: Notes improvement since ED discharge, has made some dietary changes with noted improvement.    Chronic systolic heart failure/ICM: Echo 07/11/2019 indicated EF 15%, RV systolic function mildly reduced.  Last echo on 07/09/2022 indicated EF 35 to 40%. Followed by Dr. Gala Romney, per notes no ICD for now with EF 35-40%. Today he denies increased shortness of breath, lower extremity edema. Appears euvolemic and well compensated on exam. Continue carvedilol, losartan, spironolactone and farxiga. Follow up with Dr. Gala Romney as scheduled.   Atrial fllutter, post MI: Was noted to have atrial flutter post MI requiring DCCV.  Zio monitor in 01/2021 indicated no atrial fibrillation or atrial flutter. Denies new palpitations. Continue Eliquis.  Hyperlipidemia: Last lipid profile on 05/17/22 indicated total cholesterol of 107 and LDL of 29. Continue Repatha.     Informed Consent   Shared Decision Making/Informed Consent The risks [chest pain, shortness of breath, cardiac arrhythmias, dizziness, blood pressure fluctuations, myocardial infarction, stroke/transient ischemic attack, nausea, vomiting, allergic reaction, radiation exposure, metallic taste sensation and life-threatening complications (estimated to be 1 in 10,000)], benefits (risk stratification, diagnosing coronary artery disease, treatment guidance) and alternatives of a cardiac PET stress test were discussed in detail with Mr. Dandurand and he agrees to proceed.     Dispo: Follow up with Dr. Gala Romney as scheduled on 12/16/22 and Dr. Carolan Clines on 03/16/23 as scheduled.   Signed, Rip Harbour, NP

## 2022-12-14 ENCOUNTER — Ambulatory Visit: Payer: BC Managed Care – PPO | Attending: Physician Assistant | Admitting: Cardiology

## 2022-12-14 ENCOUNTER — Encounter: Payer: Self-pay | Admitting: Physician Assistant

## 2022-12-14 VITALS — BP 106/78 | HR 87 | Ht 73.0 in | Wt 237.4 lb

## 2022-12-14 DIAGNOSIS — I255 Ischemic cardiomyopathy: Secondary | ICD-10-CM | POA: Diagnosis not present

## 2022-12-14 DIAGNOSIS — I5020 Unspecified systolic (congestive) heart failure: Secondary | ICD-10-CM

## 2022-12-14 DIAGNOSIS — R072 Precordial pain: Secondary | ICD-10-CM

## 2022-12-14 DIAGNOSIS — I2510 Atherosclerotic heart disease of native coronary artery without angina pectoris: Secondary | ICD-10-CM | POA: Diagnosis not present

## 2022-12-14 DIAGNOSIS — E785 Hyperlipidemia, unspecified: Secondary | ICD-10-CM

## 2022-12-14 DIAGNOSIS — I480 Paroxysmal atrial fibrillation: Secondary | ICD-10-CM

## 2022-12-14 NOTE — Patient Instructions (Signed)
Medication Instructions:  NO CHANGES  *If you need a refill on your cardiac medications before your next appointment, please call your pharmacy*   Testing/Procedures:  Cardiac PET Stress Test at [facility]   Follow-Up: At [facility], you and your health needs are our priority.  As part of our continuing mission to provide you with exceptional heart care, we have created designated Provider Care Teams.  These Care Teams include your primary Cardiologist (physician) and Advanced Practice Providers (APPs -  Physician Assistants and Nurse Practitioners) who all work together to provide you with the care you need, when you need it.  We recommend signing up for the patient portal called "MyChart".  Sign up information is provided on this After Visit Summary.  MyChart is used to connect with patients for Virtual Visits (Telemedicine).  Patients are able to view lab/test results, encounter notes, upcoming appointments, etc.  Non-urgent messages can be sent to your provider as well.   To learn more about what you can do with MyChart, go to [email or web address].    Your next appointment:   Provider:    AS SCHEDULED with Dr. Wyline Mood  Other Instructions How to Prepare for Your Cardiac PET/CT Stress Test:  1. Please do not take these medications before your test:   Medications that may interfere with the cardiac pharmacological stress agent (ex. nitrates - including erectile dysfunction medications, isosorbide mononitrate, tamulosin or beta-blockers) the day of the exam. (Erectile dysfunction medication should be held for at least 72 hrs prior to test) Theophylline containing medications for 12 hours. Dipyridamole 48 hours prior to the test. Your remaining medications may be taken with water.  2. Nothing to eat or drink, except water, 3 hours prior to arrival time.   NO caffeine/decaffeinated products, or chocolate 12 hours prior to arrival.  3. NO perfume, cologne or lotion  4.  Total time is 1 to 2 hours; you may want to bring reading material for the waiting time.  5. Please report to Radiology at the [facility] Main Entrance 30 minutes early for your test.  [address] [place], [place] [address]  6. Please report to Radiology at [facility] Main Entrance, medical mall, 30 mins prior to your test.  [address]  [place], [place]  [phone]  Diabetic Preparation:  Hold oral medications. You may take NPH and Lantus insulin. Do not take Humalog or Humulin R (Regular Insulin) the day of your test. Check blood sugars prior to leaving the house. If able to eat breakfast prior to 3 hour fasting, you may take all medications, including your insulin, Do not worry if you miss your breakfast dose of insulin - start at your next meal.  IF YOU THINK YOU MAY BE PREGNANT, OR ARE NURSING PLEASE INFORM THE TECHNOLOGIST.  In preparation for your appointment, medication and supplies will be purchased.  Appointment availability is limited, so if you need to cancel or reschedule, please call the Radiology Department at [phone] [facility]) OR [phone] [facility])  24 hours in advance to avoid a cancellation fee of $100.00  What to Expect After you Arrive:  Once you arrive and check in for your appointment, you will be taken to a preparation room within the Radiology Department.  A technologist or Nurse will obtain your medical history, verify that you are correctly prepped for the exam, and explain the procedure.  Afterwards,  an IV will be started in your arm and electrodes will be  placed on your skin for EKG monitoring during the stress portion of the exam. Then you will be escorted to the PET/CT scanner.  There, staff will get you positioned on the scanner and obtain a blood pressure and EKG.  During the exam, you will continue to be connected to the EKG and blood pressure machines.  A small, safe amount of a radioactive tracer  will be injected in your IV to obtain a series of pictures of your heart along with an injection of a stress agent.    After your Exam:  It is recommended that you eat a meal and drink a caffeinated beverage to counter act any effects of the stress agent.  Drink plenty of fluids for the remainder of the day and urinate frequently for the first couple of hours after the exam.  Your doctor will inform you of your test results within 7-10 business days.  For more information and frequently asked questions, please visit our website : [email or web address]  For questions about your test or how to prepare for your test, please call: Cardiac Imaging Nurse Navigators Office: [phone]

## 2022-12-14 NOTE — Progress Notes (Signed)
PCP: Cardiology: Dr [name] Primary HF Cardiologist: Dr Gala Romney   HEART FAILURE CLINIC NOTE  HPI: [name] is a 52 year old with history of CAD, STEMI, VF arrest 07/11/19 with iCM and CVAs.   In 3/21 presented with anterior STEMI with witnessed VF. He had a total of 40-45 minutes of ACLS before ROSC  LHC showed 99% proximal LAD occlusion treated with PCI/DES to LAD. No other significant disease. He was started on DAPT with Aspirin and Brilinta. Brilinta was stopped and plavix started with the addition of Eliquis (for aflutter). TEE DC-CV 3/20 with conversion to NSR EF 30-35%.   He was seen in the ED 12/11/22 with chest pain. HS Trop 4>4. EKG ok. In Sr. Given GI cocktail. Discharged to home with f/u in HF clinic.   Overall feeling fine. Denies SOB/PND/Orthopnea. Appetite ok. No fever or chills. Weight at home 170 pounds. Taking all medications   Echo 6/21 EF 35-40%  cMRI 9/21 EF 39% RV normal. Mild to moderate MR  Echo 5/22 EF 30-35% No MR  Zio 9/22 1. Sinus rhythm - avg HR of 70 bpm Rhythm. 2. Three runs of SVT occurred, the run with the fastest interval lasting 5 beats with a max rate of 182 bpm, the longest lasting 9 beats with an avg rate of 146 bpm.  3. Supraventricular Tachycardia was detected within +/- 45 seconds of symptomatic patient event(s).  4. Rare PACs and PVCs.  5. Most patient-triggered events associated with isolated PVCs.  Echo  07/09/22 EF 35-40% RV ok   ROS: All systems negative except as listed in HPI, PMH and Problem List.  SH:  Social History   Socioeconomic History   Marital status: Married    Spouse name: Not on file   Number of children: Not on file   Years of education: Not on file   Highest education level: Not on file  Occupational History   Occupation: Copy    Employer: [employer]  Tobacco Use   Smoking status: Former    Current packs/day: 0.00    Average packs/day: 1 pack/day for 21.2 years (21.2 ttl pk-yrs)    Types:  Cigarettes    Start date: 2000    Quit date: 07/11/2019    Years since quitting: 3.4   Smokeless tobacco: Never  Substance and Sexual Activity   Alcohol use: Not Currently   Drug use: Not Currently   Sexual activity: Yes  Other Topics Concern   Not on file  Social History Narrative   Not on file   Social Determinants of Health   Financial Resource Strain: Not on file  Food Insecurity: Not on file  Transportation Needs: No Transportation Needs (08/07/2019)   PRAPARE - Administrator, Civil Service (Medical): No    Lack of Transportation (Non-Medical): No  Physical Activity: Sufficiently Active (08/07/2019)   Exercise Vital Sign    Days of Exercise per Week: 7 days    Minutes of Exercise per Session: 30 min  Stress: Not on file  Social Connections: Not on file  Intimate Partner Violence: Not on file    FH:  Family History  Family history unknown: Yes    Past Medical History:  Diagnosis Date   Acute ST elevation myocardial infarction (STEMI) involving left anterior descending coronary artery (HCC)    Presented as VF arrest -> 45 minutes CPR with ROSC => Ant STEMI: Cath = 95% thrombotic/hazy prox-mid LAD -> DES PCI. ->  Severe Ischemic Cardiomyopathy (EF  improved from 15% to 35-40%).   Cardiac arrest with ventricular fibrillation (HCC) 07/11/2019   Chronic combined systolic and diastolic CHF, NYHA class 1 (HCC) 07/2019   Initial EF of 50% improved to 35-40%.  Class I-II CHF   History of multiple strokes 07/22/2019   Multiple CVAs noted on MRI post CARDIAC ARREST-prolonged CPR (innumerable punctate acute infarctions scattered throughout the cerebellum and s bilateral cerebellar hemispheres most consistent with micro emboli) no residual symptoms.   Hyperlipidemia with target LDL less than 70 07/22/2019   Ischemic cardiomyopathy -> EF ~35-40% by CMRI 07/11/2019   following anterior MI and cardiac arrest-initial EF 15% improved to 35-40% 3 months post PCI.   Paroxysmal  atrial fibrillation/a atrial flutter ->  in setting of post MI/cardiac arrest 07/18/2019   Had PAF-flutter following rewarming from [place] [employer] after cardiac arrest: TEE DCCV on 07/21/2019   Single vessel coronary disease 07/11/2019   Anterior STEMI complicated by VF arrest.  1V CAD - 95-99% p-mLAD => DES PCI: Resolute Onyx DES 3.5 x 18 (post-dilated to 4.0 mm)   Smoker 07/13/2019    Current Outpatient Medications  Medication Sig Dispense Refill   apixaban (ELIQUIS) 5 MG TABS tablet Take 1 tablet (5 mg total) by mouth 2 (two) times daily. 60 tablet 5   carvedilol (COREG) 3.125 MG tablet Take 1 tablet (3.125 mg total) by mouth 2 (two) times daily with a meal. 60 tablet 3   citalopram (CELEXA) 10 MG tablet Take 1 tablet (10 mg total) by mouth daily. Please call [phone] to schedule a November appointment with Dr. Bryan Lemma for future refills. Thank you. 90 tablet 1   clopidogrel (PLAVIX) 75 MG tablet Take 1 tablet (75 mg total) by mouth daily. 90 tablet 3   Coenzyme Q-10 200 MG CAPS Take 1 capsule by mouth daily at 6 (six) AM. (Patient not taking: Reported on 12/14/2022)     dapagliflozin propanediol (FARXIGA) 10 MG TABS tablet TAKE ONE TABLET (10MG  TOTAL) BY MOUTH DAILY BEFORE BREAKFAST 90 tablet 3   Evolocumab (REPATHA SURECLICK) 140 MG/ML SOAJ Inject 1 mL into the skin every 14 (fourteen) days. 6 mL 3   losartan (COZAAR) 25 MG tablet Take 1 tablet (25 mg total) by mouth at bedtime. 90 tablet 3   nitroGLYCERIN (NITROSTAT) 0.4 MG SL tablet DISSOLVE 1 TABLET UNDER TONGUE EVERY 5 MINUTES AS NEEDED FOR CHEST PAIN 25 tablet 11   pantoprazole (PROTONIX) 40 MG tablet Take 1 tablet (40 mg total) by mouth daily. 90 tablet 3   rosuvastatin (CRESTOR) 40 MG tablet Take 1 tablet (40 mg total) by mouth daily. 90 tablet 3   spironolactone (ALDACTONE) 25 MG tablet Take 1 tablet (25 mg total) by mouth daily. 90 tablet 3   No current facility-administered medications for this visit.    There were no  vitals filed for this visit.    PHYSICAL EXAM: General:  Well appearing. No resp difficulty HEENT: normal Neck: supple. no JVD. Carotids 2+ bilat; no bruits. No lymphadenopathy or thryomegaly appreciated. Cor: PMI nondisplaced. Regular rate & rhythm. No rubs, gallops or murmurs. Lungs: clear Abdomen: soft, nontender, nondistended. No hepatosplenomegaly. No bruits or masses. Good bowel sounds. Extremities: no cyanosis, clubbing, rash, edema Neuro: alert & orientedx3, cranial nerves grossly intact. moves all 4 extremities w/o difficulty. Affect pleasant  ASSESSMENT & PLAN:  1. CAD - LHC 3/21 showed 99% proximal LAD thrombotic occlusion, s/p PCI + DES placement. - Very active. No s/s angina - Continue Plavix - Lipids  managed by Dr. [name] -> now on Crestor, Repatha and Zetia  2. Chronic Systolic Heart Failure  -Echo 5/62/13 EF 15%. RV systolic function mildly reduced  - f/u echo on 3/13 LVEF 20-25%, TEE 07/21/19 EF 30-35% - Echo 6/21 EF 35-40% - cMRI 9/21 EF 39% RV normal. Mild to moderate MR - Echo 5/22 EF 30-35% (I thought 35-40% with anterior and anteroseptal AK) - Echo  07/09/22 EF 35-40% RV ok  - Doing great NYHA I  Volume ok - Increase carvedilol 3to 6.25 mg twice a day as need  - Continue losartan 25 qhs (off Entresto due to lowBP) - Continue spiro 25 mg daily.  - Continue Farxiga 10  - No ICD for now with EF 35-40% and NYHA I. Stable  3. H/O VF Arrest in the setting of MI.  - Plan as above  4. Multiple CVAs as on MRI  - no residual  5. Atrial flutter, post-MI - occurred post-MI, no recurrence  - Zio 9/22 no AF - Will continue Eliquis. Particularly with apical AK  Tonye Becket, NP  4:36 PM

## 2022-12-15 ENCOUNTER — Ambulatory Visit: Payer: BC Managed Care – PPO | Admitting: Physician Assistant

## 2022-12-16 ENCOUNTER — Ambulatory Visit (HOSPITAL_COMMUNITY)
Admission: RE | Admit: 2022-12-16 | Discharge: 2022-12-16 | Disposition: A | Discharge status: Home / Self Care | Payer: BC Managed Care – PPO | Source: Ambulatory Visit | Attending: Adult Health | Admitting: Adult Health

## 2022-12-16 ENCOUNTER — Encounter (HOSPITAL_COMMUNITY): Payer: Self-pay

## 2022-12-16 VITALS — BP 134/96 | HR 90 | Wt 238.0 lb

## 2022-12-16 DIAGNOSIS — Z8674 Personal history of sudden cardiac arrest: Secondary | ICD-10-CM | POA: Diagnosis not present

## 2022-12-16 DIAGNOSIS — Z955 Presence of coronary angioplasty implant and graft: Secondary | ICD-10-CM | POA: Diagnosis not present

## 2022-12-16 DIAGNOSIS — I2510 Atherosclerotic heart disease of native coronary artery without angina pectoris: Secondary | ICD-10-CM | POA: Insufficient documentation

## 2022-12-16 DIAGNOSIS — Z7902 Long term (current) use of antithrombotics/antiplatelets: Secondary | ICD-10-CM | POA: Diagnosis not present

## 2022-12-16 DIAGNOSIS — I4892 Unspecified atrial flutter: Secondary | ICD-10-CM | POA: Diagnosis not present

## 2022-12-16 DIAGNOSIS — Z8673 Personal history of transient ischemic attack (TIA), and cerebral infarction without residual deficits: Secondary | ICD-10-CM | POA: Insufficient documentation

## 2022-12-16 DIAGNOSIS — I252 Old myocardial infarction: Secondary | ICD-10-CM | POA: Diagnosis not present

## 2022-12-16 DIAGNOSIS — F3289 Other specified depressive episodes: Secondary | ICD-10-CM | POA: Diagnosis not present

## 2022-12-16 DIAGNOSIS — Z87891 Personal history of nicotine dependence: Secondary | ICD-10-CM | POA: Diagnosis not present

## 2022-12-16 DIAGNOSIS — F419 Anxiety disorder, unspecified: Secondary | ICD-10-CM | POA: Diagnosis not present

## 2022-12-16 DIAGNOSIS — E785 Hyperlipidemia, unspecified: Secondary | ICD-10-CM

## 2022-12-16 DIAGNOSIS — Z79899 Other long term (current) drug therapy: Secondary | ICD-10-CM | POA: Insufficient documentation

## 2022-12-16 DIAGNOSIS — Z7901 Long term (current) use of anticoagulants: Secondary | ICD-10-CM | POA: Insufficient documentation

## 2022-12-16 DIAGNOSIS — I5020 Unspecified systolic (congestive) heart failure: Secondary | ICD-10-CM

## 2022-12-16 DIAGNOSIS — I5022 Chronic systolic (congestive) heart failure: Secondary | ICD-10-CM | POA: Diagnosis not present

## 2022-12-16 DIAGNOSIS — F32A Depression, unspecified: Secondary | ICD-10-CM | POA: Diagnosis not present

## 2022-12-16 NOTE — Addendum Note (Signed)
Encounter addended by: Marcy Siren, LCSW on: 12/16/2022 1:48 PM  Actions taken: Flowsheet accepted, Clinical Note Signed

## 2022-12-16 NOTE — Progress Notes (Signed)
CSW met with patient who is in need of PCP follow up as well as supportive counseling. CSW provided patient with resources for PCP in [place] area. Patient reports he will follow up. Patient shared lengthy conversation about recent events with some panic attacks. He shared his journey since his cardiac arrest 3 years ago. Patient works for [employer] and after discussion states that he has access to some counseling services thru his employer. He plans to follow up there and explore options provided for some ongoing counseling. CSW provided supportive intervention and will continue to be available as needed. Lasandra Beech, LCSW, CCSW-MCS [phone]

## 2022-12-16 NOTE — Patient Instructions (Addendum)
EKG done today.   No Labs done today.   No medication changes were made. Please continue all current medications as prescribed.  PLEASE SCHEDULE AN APPOINTMENT WITH YOUR PRIMARY CARE PHYSICIAN.   Your physician recommends that you schedule a follow-up appointment in: 3 months with Dr. Gala Romney.   If you have any questions or concerns before your next appointment please send [place] a message through [place] or call our office at [phone].    TO LEAVE A MESSAGE FOR THE NURSE SELECT OPTION 2, PLEASE LEAVE A MESSAGE INCLUDING: YOUR NAME DATE OF BIRTH CALL BACK NUMBER REASON FOR CALL**this is important as we prioritize the call backs  YOU WILL RECEIVE A CALL BACK THE SAME DAY AS LONG AS YOU CALL BEFORE 4:00 PM   Do the following things EVERYDAY: Weigh yourself in the morning before breakfast. Write it down and keep it in a log. Take your medicines as prescribed Eat low salt foods--Limit salt (sodium) to 2000 mg per day.  Stay as active as you can everyday Limit all fluids for the day to less than 2 liters   At the [facility], you and your health needs are our priority. As part of our continuing mission to provide you with exceptional heart care, we have created designated Provider Care Teams. These Care Teams include your primary Cardiologist (physician) and Advanced Practice Providers (APPs- Physician Assistants and Nurse Practitioners) who all work together to provide you with the care you need, when you need it.   You may see any of the following providers on your designated Care Team at your next follow up: Dr Arvilla Meres Dr Marca Ancona Dr. Marcos Eke, NP Robbie Lis, [place] [facility] [place], [place] Brynda Peon, NP Karle Plumber, PharmD   Please be sure to bring in all your medications bottles to every appointment.    Thank you for choosing [facility]

## 2023-01-11 ENCOUNTER — Telehealth (HOSPITAL_COMMUNITY): Payer: Self-pay | Admitting: Emergency Medicine

## 2023-01-11 NOTE — Telephone Encounter (Signed)
Reaching out to patient to offer assistance regarding upcoming cardiac imaging study; pt verbalizes understanding of appt date/time, parking situation and where to check in, pre-test NPO status and medications ordered, and verified current allergies; name and call back number provided for further questions should they arise Sara Wallace RN Navigator Cardiac Imaging [facility] [phone] office [phone] cell 

## 2023-01-13 ENCOUNTER — Ambulatory Visit
Admission: RE | Admit: 2023-01-13 | Discharge: 2023-01-13 | Disposition: A | Discharge status: Home / Self Care | Payer: BC Managed Care – PPO | Source: Ambulatory Visit | Attending: Cardiology | Admitting: Cardiology

## 2023-01-13 DIAGNOSIS — I480 Paroxysmal atrial fibrillation: Secondary | ICD-10-CM

## 2023-01-13 DIAGNOSIS — R072 Precordial pain: Secondary | ICD-10-CM | POA: Diagnosis present

## 2023-01-13 DIAGNOSIS — I255 Ischemic cardiomyopathy: Secondary | ICD-10-CM

## 2023-01-13 DIAGNOSIS — I2510 Atherosclerotic heart disease of native coronary artery without angina pectoris: Secondary | ICD-10-CM

## 2023-01-13 DIAGNOSIS — E785 Hyperlipidemia, unspecified: Secondary | ICD-10-CM | POA: Diagnosis not present

## 2023-01-13 DIAGNOSIS — I5020 Unspecified systolic (congestive) heart failure: Secondary | ICD-10-CM

## 2023-01-13 LAB — NM PET CT CARDIAC PERFUSION MULTI W/ABSOLUTE BLOODFLOW
LV dias vol: 203 mL (ref 62–150)
LV sys vol: 137 mL
Nuc Rest EF: 33 %
Nuc Stress EF: 34 %
Peak HR: 107 {beats}/min
Rest HR: 80 {beats}/min
Rest Nuclear Isotope Dose: 24.8 mCi
SRS: 33
SSS: 31
ST Depression (mm): 0 mm
Stress Nuclear Isotope Dose: 25.3 mCi
TID: 0.91

## 2023-01-13 MED ORDER — RUBIDIUM RB82 GENERATOR (RUBYFILL)
25.0000 | PACK | Freq: Once | INTRAVENOUS | Status: AC
  Administered 2023-01-13: 24.8 via INTRAVENOUS

## 2023-01-13 MED ORDER — RUBIDIUM RB82 GENERATOR (RUBYFILL)
25.0000 | PACK | Freq: Once | INTRAVENOUS | Status: AC
  Administered 2023-01-13: 25.33 via INTRAVENOUS

## 2023-01-13 MED ORDER — REGADENOSON 0.4 MG/5ML IV SOLN
0.4000 mg | Freq: Once | INTRAVENOUS | Status: AC
  Administered 2023-01-13: 0.4 mg via INTRAVENOUS
  Filled 2023-01-13: qty 5

## 2023-01-21 ENCOUNTER — Other Ambulatory Visit: Payer: Self-pay | Admitting: *Deleted

## 2023-01-21 DIAGNOSIS — I5020 Unspecified systolic (congestive) heart failure: Secondary | ICD-10-CM

## 2023-01-24 ENCOUNTER — Other Ambulatory Visit: Payer: Self-pay | Admitting: Pharmacist

## 2023-01-24 DIAGNOSIS — E785 Hyperlipidemia, unspecified: Secondary | ICD-10-CM

## 2023-01-24 DIAGNOSIS — I2510 Atherosclerotic heart disease of native coronary artery without angina pectoris: Secondary | ICD-10-CM

## 2023-01-24 DIAGNOSIS — I2102 ST elevation (STEMI) myocardial infarction involving left anterior descending coronary artery: Secondary | ICD-10-CM

## 2023-01-24 MED ORDER — REPATHA SURECLICK 140 MG/ML ~~LOC~~ SOAJ: 1.0000 mL | SUBCUTANEOUS | 3 refills | Status: DC

## 2023-02-02 ENCOUNTER — Telehealth (HOSPITAL_COMMUNITY): Payer: Self-pay

## 2023-02-02 NOTE — Telephone Encounter (Signed)
Received a fax requesting medical records from [facility]. Records were successfully faxed to: [phone] ,which was the number provided.. Medical request form will be scanned into patients chart.

## 2023-02-07 ENCOUNTER — Ambulatory Visit (HOSPITAL_COMMUNITY): Payer: BC Managed Care – PPO | Attending: Cardiology

## 2023-02-07 DIAGNOSIS — I5020 Unspecified systolic (congestive) heart failure: Secondary | ICD-10-CM | POA: Insufficient documentation

## 2023-02-07 LAB — ECHOCARDIOGRAM COMPLETE
Area-P 1/2: 4.58 cm2
S' Lateral: 5.67 cm

## 2023-02-07 MED ORDER — PERFLUTREN LIPID MICROSPHERE
1.0000 mL | INTRAVENOUS | Status: AC | PRN
Start: 2023-02-07 — End: 2023-02-07
  Administered 2023-02-07: 2 mL via INTRAVENOUS

## 2023-02-08 ENCOUNTER — Other Ambulatory Visit (HOSPITAL_COMMUNITY): Payer: Self-pay | Admitting: Internal Medicine

## 2023-03-16 ENCOUNTER — Encounter: Payer: Self-pay | Admitting: Internal Medicine

## 2023-03-16 ENCOUNTER — Ambulatory Visit (INDEPENDENT_AMBULATORY_CARE_PROVIDER_SITE_OTHER): Payer: BC Managed Care – PPO | Admitting: Internal Medicine

## 2023-03-16 VITALS — BP 120/76 | HR 70 | Ht 73.0 in | Wt 247.4 lb

## 2023-03-16 DIAGNOSIS — I4901 Ventricular fibrillation: Secondary | ICD-10-CM

## 2023-03-16 NOTE — Progress Notes (Signed)
Patient had a prior appointment however could not make it in the summer. There was no oversight as it relates to his follow-up appointments and so he came here to this clinic.  He is seeing Dr. Gala Romney and I recommended that he continue to follow with him as opposed to myself.  We both agreed that there was no point in billing him.

## 2023-03-20 ENCOUNTER — Other Ambulatory Visit: Payer: Self-pay | Admitting: Cardiology

## 2023-03-21 ENCOUNTER — Encounter (HOSPITAL_COMMUNITY): Payer: BC Managed Care – PPO | Admitting: Internal Medicine

## 2023-03-23 ENCOUNTER — Encounter (HOSPITAL_COMMUNITY): Payer: Self-pay | Admitting: Internal Medicine

## 2023-03-23 ENCOUNTER — Ambulatory Visit (HOSPITAL_COMMUNITY)
Admission: RE | Admit: 2023-03-23 | Discharge: 2023-03-23 | Disposition: A | Discharge status: Home / Self Care | Payer: BC Managed Care – PPO | Source: Ambulatory Visit | Attending: Internal Medicine | Admitting: Internal Medicine

## 2023-03-23 VITALS — BP 124/84 | HR 93 | Wt 244.0 lb

## 2023-03-23 DIAGNOSIS — I480 Paroxysmal atrial fibrillation: Secondary | ICD-10-CM

## 2023-03-23 DIAGNOSIS — I5020 Unspecified systolic (congestive) heart failure: Secondary | ICD-10-CM | POA: Insufficient documentation

## 2023-03-23 DIAGNOSIS — I2510 Atherosclerotic heart disease of native coronary artery without angina pectoris: Secondary | ICD-10-CM | POA: Diagnosis not present

## 2023-03-23 NOTE — Progress Notes (Signed)
PCP: Cardiology: Dr [name] Primary HF Cardiologist: Dr Gala Romney   HEART FAILURE CLINIC NOTE  HPI: Edward Mendoza is a 52 yo male with history of CAD, STEMI, VF arrest 07/11/19 with iCM and CVAs.   In 3/21 presented with anterior STEMI with witnessed VF. He had a total of 40-45 minutes of ACLS before ROSC  LHC showed 99% proximal LAD occlusion treated with PCI/DES to LAD. No other significant disease. He was started on DAPT with Aspirin and Brilinta. Brilinta was stopped and plavix started with the addition of Eliquis (for aflutter). TEE DC-CV 3/20 with conversion to NSR EF 30-35%.    cMRI 9/21 EF 39% RV normal. Mild to moderate Edward Echo 5/22 EF 30-35% No Edward  Zio 9/22 1. Sinus rhythm - avg HR of 70 bpm Rhythm. 2. Three runs of SVT occurred, the run with the fastest interval lasting 5 beats with a max rate of 182 bpm, the longest lasting 9 beats with an avg rate of 146 bpm.  3. Rare PACs and PVCs.   Echo  07/09/22 EF 35-40% RV ok  Seen in ED 8/24 with noncardiac CP. ECG and trops normal.,  PET perfusion 9/24 EF 33% fixed defect. No TID. EF 30-35% I suggested ICD. He wants to defer for now.  Here for f/u. Feels good. Working full time with physical job. Still has some anxiety at times. No angina or SOB. No edema.   ROS: All systems negative except as listed in HPI, PMH and Problem List.  SH:  Social History   Socioeconomic History   Marital status: Married    Spouse name: Not on file   Number of children: Not on file   Years of education: Not on file   Highest education level: Not on file  Occupational History   Occupation: Copy    Employer: [employer]  Tobacco Use   Smoking status: Former    Current packs/day: 0.00    Average packs/day: 1 pack/day for 21.2 years (21.2 ttl pk-yrs)    Types: Cigarettes    Start date: 2000    Quit date: 07/11/2019    Years since quitting: 3.7   Smokeless tobacco: Never  Substance and Sexual Activity   Alcohol use: Not  Currently   Drug use: Not Currently   Sexual activity: Yes  Other Topics Concern   Not on file  Social History Narrative   Not on file   Social Determinants of Health   Financial Resource Strain: Low Risk  (12/16/2022)   Overall Financial Resource Strain (CARDIA)    Difficulty of Paying Living Expenses: Not hard at all  Food Insecurity: No Food Insecurity (12/16/2022)   Hunger Vital Sign    Worried About Running Out of Food in the Last Year: Never true    Ran Out of Food in the Last Year: Never true  Transportation Needs: No Transportation Needs (12/16/2022)   PRAPARE - Administrator, Civil Service (Medical): No    Lack of Transportation (Non-Medical): No  Physical Activity: Sufficiently Active (08/07/2019)   Exercise Vital Sign    Days of Exercise per Week: 7 days    Minutes of Exercise per Session: 30 min  Stress: Not on file  Social Connections: Not on file  Intimate Partner Violence: Not on file    FH:  Family History  Family history unknown: Yes    Past Medical History:  Diagnosis Date   Acute ST elevation myocardial infarction (STEMI) involving left anterior descending coronary  artery (HCC)    Presented as VF arrest -> 45 minutes CPR with ROSC => Ant STEMI: Cath = 95% thrombotic/hazy prox-mid LAD -> DES PCI. ->  Severe Ischemic Cardiomyopathy (EF improved from 15% to 35-40%).   Cardiac arrest with ventricular fibrillation (HCC) 07/11/2019   Chronic combined systolic and diastolic CHF, NYHA class 1 (HCC) 07/2019   Initial EF of 50% improved to 35-40%.  Class I-II CHF   History of multiple strokes 07/22/2019   Multiple CVAs noted on MRI post CARDIAC ARREST-prolonged CPR (innumerable punctate acute infarctions scattered throughout the cerebellum and s bilateral cerebellar hemispheres most consistent with micro emboli) no residual symptoms.   Hyperlipidemia with target LDL less than 70 07/22/2019   Ischemic cardiomyopathy -> EF ~35-40% by CMRI 07/11/2019    following anterior MI and cardiac arrest-initial EF 15% improved to 35-40% 3 months post PCI.   Paroxysmal atrial fibrillation/a atrial flutter ->  in setting of post MI/cardiac arrest 07/18/2019   Had PAF-flutter following rewarming from [facility] after cardiac arrest: TEE DCCV on 07/21/2019   Single vessel coronary disease 07/11/2019   Anterior STEMI complicated by VF arrest.  1V CAD - 95-99% p-mLAD => DES PCI: Resolute Onyx DES 3.5 x 18 (post-dilated to 4.0 mm)   Smoker 07/13/2019    Current Outpatient Medications  Medication Sig Dispense Refill   apixaban (ELIQUIS) 5 MG TABS tablet Take 1 tablet (5 mg total) by mouth 2 (two) times daily. 60 tablet 5   carvedilol (COREG) 3.125 MG tablet Take 1 tablet (3.125 mg total) by mouth 2 (two) times daily with a meal. 60 tablet 3   citalopram (CELEXA) 10 MG tablet Take 1 tablet (10 mg total) by mouth daily. Please call [phone] to schedule a November appointment with Dr. Bryan Lemma for future refills. Thank you. (Patient taking differently: Take 20 mg by mouth daily. Please call [phone] to schedule a November appointment with Dr. Bryan Lemma for future refills. Thank you.) 90 tablet 1   citalopram (CELEXA) 20 MG tablet Take 20 mg by mouth daily.     clopidogrel (PLAVIX) 75 MG tablet Take 1 tablet (75 mg total) by mouth daily. 90 tablet 3   Coenzyme Q-10 200 MG CAPS Take 1 capsule by mouth daily at 6 (six) AM.     dapagliflozin propanediol (FARXIGA) 10 MG TABS tablet TAKE ONE TABLET (10MG  TOTAL) BY MOUTH DAILY BEFORE BREAKFAST 90 tablet 3   Evolocumab (REPATHA SURECLICK) 140 MG/ML SOAJ Inject 140 mg into the skin every 14 (fourteen) days. 6 mL 3   losartan (COZAAR) 25 MG tablet Take 1 tablet (25 mg total) by mouth at bedtime. 90 tablet 3   nitroGLYCERIN (NITROSTAT) 0.4 MG SL tablet DISSOLVE 1 TABLET UNDER TONGUE EVERY 5 MINUTES AS NEEDED FOR CHEST PAIN 25 tablet 11   pantoprazole (PROTONIX) 40 MG tablet Take 1 tablet (40 mg total) by mouth  daily. 90 tablet 3   rosuvastatin (CRESTOR) 40 MG tablet Take 1 tablet (40 mg total) by mouth daily. 90 tablet 3   spironolactone (ALDACTONE) 25 MG tablet Take 1 tablet (25 mg total) by mouth daily. 90 tablet 3   No current facility-administered medications for this encounter.    Vitals:   03/23/23 1022  BP: 124/84  Pulse: 93  SpO2: 97%  Weight: 110.7 kg (244 lb)    PHYSICAL EXAM: General:  Well appearing. No resp difficulty HEENT: normal Neck: supple. no JVD. Carotids 2+ bilat; no bruits. No lymphadenopathy or thryomegaly appreciated. Cor:  PMI nondisplaced. Regular rate & rhythm. No rubs, gallops or murmurs. Lungs: clear Abdomen: soft, nontender, nondistended. No hepatosplenomegaly. No bruits or masses. Good bowel sounds. Extremities: no cyanosis, clubbing, rash, edema Neuro: alert & orientedx3, cranial nerves grossly intact. moves all 4 extremities w/o difficulty. Affect pleasant    ASSESSMENT & PLAN:  1. CAD - LHC 3/21 showed 99% proximal LAD thrombotic occlusion, s/p PCI + DES placement. - Very active. No s/s angina - Continue Plavix - Lipids managed by Dr. [name] -> now on Crestor, Repatha and Zetia - PET perfusion 9/24 EF 33% fixed defect. No TID  2. Chronic Systolic Heart Failure  -Echo 1/61/09 EF 15%. RV systolic function mildly reduced  - f/u echo on 3/13 LVEF 20-25%, TEE 07/21/19 EF 30-35% - Echo 6/21 EF 35-40% - cMRI 9/21 EF 39% RV normal. Mild to moderate Edward - Echo 5/22 EF 30-35% (I thought 35-40% with anterior and anteroseptal AK) - Echo  07/09/22 EF 35-40% RV ok  - PET perfusion 9/24 EF 33% fixed defect. No TID - Echo 10/24 EF 30-35% - Doing great NYHA I. Volume ok - Continue carvedilol 3.125 mg twice a day as need . Failed titration to 6.25 bid (says HR is in 60s) - Continue losartan 25 qhs (off Entresto due to lowBP) - Continue spiro 25 mg daily.  - Continue Farxiga 10  - Has not tolerated further increases on GDMT - EF 30-35% I suggested ICD. He wants  to defer for now.   3. H/O VF Arrest in the setting of MI.  - Plan as above  4. Multiple CVAs as on MRI  - no residual  5. Atrial flutter, post-MI - occurred post-MI, no recurrence  - Zio 9/22 no AF - Will continue Eliquis. Particularly with apical AK  6. Anxiety/PTSD - continue SSRI - has seen a therapist  Arvilla Meres, MD  10:43 AM

## 2023-03-23 NOTE — Patient Instructions (Signed)
Medication Changes:  No Changes In Medications at this time.   Follow-Up in: 1 YEAR WITH DR. Gala Romney WITH AN ECHO PLEASE CALL OUR OFFICE AROUND SEPTEMBER 2025 TO GET SCHEDULED FOR YOUR APPOINTMENT. PHONE NUMBER IS [phone] OPTION 2   At the [facility], you and your health needs are our priority. We have a designated team specialized in the treatment of Heart Failure. This Care Team includes your primary Heart Failure Specialized Cardiologist (physician), Advanced Practice Providers (APPs- Physician Assistants and Nurse Practitioners), and Pharmacist who all work together to provide you with the care you need, when you need it.   You may see any of the following providers on your designated Care Team at your next follow up:  Dr. Arvilla Meres Dr. Marca Ancona Dr. Dorthula Nettles Dr. Theresia Bough Tonye Becket, NP Robbie Lis, [place] [facility] [place], [place] Brynda Peon, NP [place] Lee, NP Karle Plumber, PharmD   Please be sure to bring in all your medications bottles to every appointment.   Need to Contact us:  If you have any questions or concerns before your next appointment please send [place] a message through [place] or call our office at [phone].    TO LEAVE A MESSAGE FOR THE NURSE SELECT OPTION 2, PLEASE LEAVE A MESSAGE INCLUDING: YOUR NAME DATE OF BIRTH CALL BACK NUMBER REASON FOR CALL**this is important as we prioritize the call backs  YOU WILL RECEIVE A CALL BACK THE SAME DAY AS LONG AS YOU CALL BEFORE 4:00 PM

## 2023-04-07 ENCOUNTER — Other Ambulatory Visit: Payer: Self-pay | Admitting: *Deleted

## 2023-04-07 DIAGNOSIS — I480 Paroxysmal atrial fibrillation: Secondary | ICD-10-CM

## 2023-04-07 MED ORDER — APIXABAN 5 MG PO TABS
5.0000 mg | ORAL_TABLET | Freq: Two times a day (BID) | ORAL | 5 refills | Status: DC
Start: 2023-04-07 — End: 2023-06-07

## 2023-04-07 NOTE — Telephone Encounter (Signed)
Eliquis 5mg  refill request received. Patient is 52 years old, weight-110.7kg, Crea-1.29 on 12/11/22, Diagnosis-Aflutter/Afib, and last seen by Dr. Gala Romney on 03/23/23. Dose is appropriate based on dosing criteria. Will send in refill to requested pharmacy.

## 2023-05-08 ENCOUNTER — Other Ambulatory Visit (HOSPITAL_COMMUNITY): Payer: Self-pay | Admitting: Internal Medicine

## 2023-05-23 ENCOUNTER — Other Ambulatory Visit: Payer: Self-pay | Admitting: Cardiology

## 2023-06-06 ENCOUNTER — Other Ambulatory Visit: Payer: Self-pay

## 2023-06-06 MED ORDER — LOSARTAN POTASSIUM 25 MG PO TABS: 25.0000 mg | ORAL_TABLET | Freq: Every evening | ORAL | 3 refills | Status: DC

## 2023-06-07 ENCOUNTER — Encounter: Payer: Self-pay | Admitting: Cardiology

## 2023-06-07 ENCOUNTER — Other Ambulatory Visit: Payer: Self-pay

## 2023-06-07 DIAGNOSIS — I480 Paroxysmal atrial fibrillation: Secondary | ICD-10-CM

## 2023-06-07 MED ORDER — APIXABAN 5 MG PO TABS
5.0000 mg | ORAL_TABLET | Freq: Two times a day (BID) | ORAL | 5 refills | Status: DC
Start: 2023-06-07 — End: 2023-06-08

## 2023-06-07 NOTE — Telephone Encounter (Signed)
Prescription refill request for Eliquis received. Indication:vfib Last office visit:11/24 Scr:1.29  8/24 Age: 53 Weight:110.7  kg  Prescription refilled

## 2023-06-08 ENCOUNTER — Telehealth: Payer: Self-pay | Admitting: Cardiology

## 2023-06-08 DIAGNOSIS — I480 Paroxysmal atrial fibrillation: Secondary | ICD-10-CM

## 2023-06-08 MED ORDER — APIXABAN 5 MG PO TABS
5.0000 mg | ORAL_TABLET | Freq: Two times a day (BID) | ORAL | 5 refills | Status: DC
Start: 2023-06-08 — End: 2023-11-07

## 2023-06-08 NOTE — Telephone Encounter (Signed)
*  STAT* If patient is at the pharmacy, call can be transferred to refill team.   1. Which medications need to be refilled? (please list name of each medication and dose if known) new prescription for Eliquis    2. Would you like to learn more about the convenience, safety, & potential cost savings by using the [facility]?     3. Are you open to using the [facility] (Type [facility].    4. Which pharmacy/location (including street and city if local pharmacy) is medication to be sent to?  [facility] [place]  5. Do they need a 30 day or 90 day supply? 30 days and refills please call today(out of medicine)

## 2023-06-08 NOTE — Telephone Encounter (Signed)
 Prescription refill request for Eliquis  received. Indication: Aflutter Last office visit: 03/23/23 (Bensimhon)  Scr: 1.29 (12/11/22)  Age: 53 Weight: 110.7kg  Appropriate dose. Refill sent.

## 2023-07-09 ENCOUNTER — Other Ambulatory Visit: Payer: Self-pay | Admitting: Cardiology

## 2023-07-11 ENCOUNTER — Other Ambulatory Visit: Payer: Self-pay

## 2023-07-11 MED ORDER — PANTOPRAZOLE SODIUM 40 MG PO TBEC: 40.0000 mg | DELAYED_RELEASE_TABLET | Freq: Every day | ORAL | 1 refills | Status: DC

## 2023-08-09 ENCOUNTER — Other Ambulatory Visit: Payer: Self-pay

## 2023-08-09 MED ORDER — NITROGLYCERIN 0.4 MG SL SUBL: SUBLINGUAL_TABLET | SUBLINGUAL | 3 refills | Status: DC

## 2023-08-09 NOTE — Telephone Encounter (Signed)
 This is a CHF pt

## 2023-09-05 ENCOUNTER — Other Ambulatory Visit: Payer: Self-pay | Admitting: Cardiology

## 2023-09-06 ENCOUNTER — Other Ambulatory Visit (HOSPITAL_COMMUNITY): Payer: Self-pay | Admitting: Internal Medicine

## 2023-09-21 ENCOUNTER — Telehealth: Payer: Self-pay | Admitting: Pharmacy Technician

## 2023-09-21 NOTE — Telephone Encounter (Signed)
 Pharmacy Patient Advocate Encounter  Received notification from [facility] that Prior Authorization for repatha  has been APPROVED from 09/21/23 to 09/20/24. Spoke to pharmacy to process.Copay is $15.00.    PA #/Case ID/Reference #: [id number]

## 2023-09-21 NOTE — Telephone Encounter (Signed)
 Pharmacy Patient Advocate Encounter   Received notification from CoverMyMeds that prior authorization for REPATHA  is required/requested.   Insurance verification completed.   The patient is insured through [facility] .   Per test claim: PA required; PA submitted to above mentioned insurance via CoverMyMeds Key/confirmation #/EOC [id number] Status is pending

## 2023-09-22 ENCOUNTER — Other Ambulatory Visit (HOSPITAL_COMMUNITY): Payer: Self-pay

## 2023-11-05 ENCOUNTER — Other Ambulatory Visit (HOSPITAL_COMMUNITY): Payer: Self-pay | Admitting: Internal Medicine

## 2023-11-07 ENCOUNTER — Other Ambulatory Visit: Payer: Self-pay | Admitting: Pharmacist

## 2023-11-07 DIAGNOSIS — I480 Paroxysmal atrial fibrillation: Secondary | ICD-10-CM

## 2023-11-07 MED ORDER — APIXABAN 5 MG PO TABS: 5.0000 mg | ORAL_TABLET | Freq: Two times a day (BID) | ORAL | 5 refills | Status: DC

## 2023-12-04 ENCOUNTER — Ambulatory Visit (HOSPITAL_BASED_OUTPATIENT_CLINIC_OR_DEPARTMENT_OTHER)
Admission: EM | Admit: 2023-12-04 | Discharge: 2023-12-04 | Disposition: A | Discharge status: Home / Self Care | Attending: Family Medicine | Admitting: Family Medicine

## 2023-12-04 ENCOUNTER — Ambulatory Visit (INDEPENDENT_AMBULATORY_CARE_PROVIDER_SITE_OTHER): Admit: 2023-12-04 | Discharge: 2023-12-04 | Disposition: A | Discharge status: Home / Self Care | Admitting: Radiology

## 2023-12-04 ENCOUNTER — Encounter (HOSPITAL_BASED_OUTPATIENT_CLINIC_OR_DEPARTMENT_OTHER): Payer: Self-pay

## 2023-12-04 DIAGNOSIS — M25561 Pain in right knee: Secondary | ICD-10-CM

## 2023-12-04 NOTE — ED Triage Notes (Signed)
 States on Thursday, began having pain to anterior right lower leg. Points to area of pain from top of ankle with radiation distally approx 4-5 inches. No known injury. Does stand a lot during the day. Patient states he does wear supportive shoes but the pair he has been wearing recently are ending their time of usefulness.

## 2023-12-04 NOTE — ED Provider Notes (Signed)
 [facility]    CSN: [id number] Arrival date & time: 12/04/23  0829      History   Chief Complaint Chief Complaint  Patient presents with   Leg Pain    HPI Edward Mendoza is a 53 y.o. male.   Pt is a 53 year old male that presents with  anterior right lower leg since Thursday. Points to area of pain from top of ankle with radiation distally approx 4-5 inches. No known injury. Does stand a lot during the day. Walked 28,000 steps one day last week.  Patient states he does wear supportive shoes but the pair he has been wearing recently are ending their time of usefulness.    Leg Pain   Past Medical History:  Diagnosis Date   Acute ST elevation myocardial infarction (STEMI) involving left anterior descending coronary artery (HCC)    Presented as VF arrest -> 45 minutes CPR with ROSC => Ant STEMI: Cath = 95% thrombotic/hazy prox-mid LAD -> DES PCI. ->  Severe Ischemic Cardiomyopathy (EF improved from 15% to 35-40%).   Cardiac arrest with ventricular fibrillation (HCC) 07/11/2019   Chronic combined systolic and diastolic CHF, NYHA class 1 (HCC) 07/2019   Initial EF of 50% improved to 35-40%.  Class I-II CHF   History of multiple strokes 07/22/2019   Multiple CVAs noted on MRI post CARDIAC ARREST-prolonged CPR (innumerable punctate acute infarctions scattered throughout the cerebellum and s bilateral cerebellar hemispheres most consistent with micro emboli) no residual symptoms.   Hyperlipidemia with target LDL less than 70 07/22/2019   Ischemic cardiomyopathy -> EF ~35-40% by CMRI 07/11/2019   following anterior MI and cardiac arrest-initial EF 15% improved to 35-40% 3 months post PCI.   Paroxysmal atrial fibrillation/a atrial flutter ->  in setting of post MI/cardiac arrest 07/18/2019   Had PAF-flutter following rewarming from [facility] after cardiac arrest: TEE DCCV on 07/21/2019   Single vessel coronary disease 07/11/2019   Anterior STEMI complicated by VF arrest.  1V  CAD - 95-99% p-mLAD => DES PCI: Resolute Onyx DES 3.5 x 18 (post-dilated to 4.0 mm)   Smoker 07/13/2019    Patient Active Problem List   Diagnosis Date Noted   HFrEF (heart failure with reduced ejection fraction) (HCC)-NYHA Class I symptoms, EF 39% by CMR 06/02/2020   Ischemic cardiomyopathy -> following anterior MI and cardiac arrest-initial EF 15% improved to 35-40% 3 months post PCI. 11/17/2019   History of multiple strokes 07/22/2019   Pneumonia 07/22/2019   Depression 07/22/2019   Cardiogenic shock (HCC) Initial E 15% 07/22/2019   Hyperlipidemia with target LDL less than 70 07/22/2019   Paroxysmal atrial fibrillation/a atrial flutter ->  in setting of post MI/cardiac arrest 07/18/2019   VF (ventricular fibrillation) (HCC) 07/11/2019   Cardiac arrest with ventricular fibrillation (HCC) 07/11/2019   CAD S/P DES PCI of LAD 07/11/2019   ST elevation myocardial infarction (STEMI) involving left anterior descending (LAD) coronary artery in recovery phase [facility])     Past Surgical History:  Procedure Laterality Date   ARTERIAL LINE INSERTION N/A 07/11/2019   Procedure: ARTERIAL LINE INSERTION;  Surgeon: Anner Alm ORN, MD;  Location: [facility] INVASIVE CV LAB;  Service: Cardiovascular;  Laterality: N/A;   CARDIAC MRI  01/11/2020   LVEF ~39%. Akinesis of basal-mid inferoseptal, anteroseptal & apical anterior-septal-inferior walls as well as tru apex.  Paradoxical septal WM. 75-100% late enhancement - FIXED INFARCT. Normal RV function. Mod LA dilation. Mild-mod MR & TR.   CARDIOVERSION N/A  07/21/2019   Procedure: CARDIOVERSION;  Surgeon: Cherrie Toribio SAUNDERS, MD;  Location: [facility] ENDOSCOPY;  Service: Cardiovascular;; TTE - DCCV: Atrial flutter-restored sinus rhythm   CORONARY STENT INTERVENTION N/A 07/11/2019   Procedure: CORONARY STENT INTERVENTION;  Surgeon: Anner Alm ORN, MD;  Location: [facility] INVASIVE CV LAB;  Service: Cardiovascular;;  proximal-mid LAD 95% hazy lesion (DES PCI Resolute Onyx 3.5 mm  18 mm-at 4.0 mm),   CORONARY/GRAFT ACUTE MI REVASCULARIZATION N/A 07/11/2019   Procedure: Coronary/Graft Acute MI Revascularization;  Surgeon: Anner Alm ORN, MD;  Location: [facility] INVASIVE CV LAB;  DES PCI of p-m LAD 95-99%.   RIGHT/LEFT HEART CATH AND CORONARY ANGIOGRAPHY N/A 07/11/2019   Procedure: RIGHT/LEFT HEART CATH AND CORONARY ANGIOGRAPHY;  Surgeon: Anner Alm ORN, MD;  Location: [facility] INVASIVE CV LAB:: Ant STEMI/Cardiac Arrest:  p-mLAD 95-99% thrombotic stenosis --> DES PCI. EF ~35%. Normal RHC Pressures. PCWP ~20 mmHg with LVEDP ~18 mmHg.  CO-CI 4.77 - 2.04 (no MCS placed)    TEE WITHOUT CARDIOVERSION N/A 07/21/2019   Procedure: TRANSESOPHAGEAL ECHOCARDIOGRAM (TEE) W/ CARDIOVERSION;  Surgeon: Cherrie Toribio SAUNDERS, MD;  Location: [facility] ENDOSCOPY;;  EF 30 to 35%.  Mid-apical anterior and apical hypo to akinesis.  No LAA. ->  Successful DCCV of Atrial Flutter.   TRANSTHORACIC ECHOCARDIOGRAM  07/2019   a) Initial Post PCI-postarrest: EF~15%.  Severely reduced with global HK.  Moderate LVH.  Mildly dilated IVC.; b) 07/14/2019: EF 20 to 25%.  Entire anteroseptal, anterior and apical wall severe hypokinesis with moderate HK of mid-anterior inferior wall.  (Improved EF).   TRANSTHORACIC ECHOCARDIOGRAM  10/24/2019   EF 35 to 40%.  Moderately dilated LV indeterminate filling pressures.  Akinetic mid anteroseptal wall as well as apical anterior and apical septal wall.  Otherwise normal valves and remaining chambers are normal.  History of PFO but no shunting.   TRANSTHORACIC ECHOCARDIOGRAM  08/2020   EF ~30-35% (~ slightly worse than 10/2019).       Home Medications    Prior to Admission medications   Medication Sig Start Date End Date Taking? Authorizing Provider  apixaban  (ELIQUIS ) 5 MG TABS tablet Take 1 tablet (5 mg total) by mouth 2 (two) times daily. 11/07/23   Bensimhon, Toribio SAUNDERS, MD  carvedilol  (COREG ) 3.125 MG tablet Take 1 tablet (3.125 mg total) by mouth 2 (two) times daily with a meal. 11/07/23    Bensimhon, Toribio SAUNDERS, MD  citalopram  (CELEXA ) 10 MG tablet Take 1 tablet (10 mg total) by mouth daily. 03/23/23   Anner Alm ORN, MD  citalopram  (CELEXA ) 20 MG tablet Take 20 mg by mouth daily. 03/07/23   [provider]  clopidogrel  (PLAVIX ) 75 MG tablet Take 1 tablet (75 mg total) by mouth daily. 05/23/23   Anner Alm ORN, MD  Coenzyme Q-10 200 MG CAPS Take 1 capsule by mouth daily at 6 (six) AM.    [provider]  dapagliflozin  propanediol (FARXIGA ) 10 MG TABS tablet TAKE ONE TABLET (10MG  TOTAL) BY MOUTH DAILY BEFORE BREAKFAST 09/09/23   Bensimhon, Toribio SAUNDERS, MD  Evolocumab  (REPATHA  SURECLICK) 140 MG/ML SOAJ Inject 140 mg into the skin every 14 (fourteen) days. 01/24/23   Anner Alm ORN, MD  losartan  (COZAAR ) 25 MG tablet Take 1 tablet (25 mg total) by mouth at bedtime. 06/06/23   Alvan Ronal BRAVO, MD  nitroGLYCERIN  (NITROSTAT ) 0.4 MG SL tablet DISSOLVE 1 TABLET UNDER TONGUE EVERY 5 MINUTES AS NEEDED FOR CHEST PAIN 08/09/23   Anner Alm ORN, MD  pantoprazole  (PROTONIX ) 40 MG tablet  Take 1 tablet (40 mg total) by mouth daily. 07/11/23   Anner Alm ORN, MD  rosuvastatin  (CRESTOR ) 40 MG tablet Take 1 tablet (40 mg total) by mouth daily. 07/11/23 07/05/24  Anner Alm ORN, MD  spironolactone  (ALDACTONE ) 25 MG tablet Take 1 tablet (25 mg total) by mouth daily. 09/06/23   Anner Alm ORN, MD    Family History Family History  Family history unknown: Yes    Social History Social History   Tobacco Use   Smoking status: Former    Current packs/day: 0.00    Average packs/day: 1 pack/day for 21.2 years (21.2 ttl pk-yrs)    Types: Cigarettes    Start date: 2000    Quit date: 07/11/2019    Years since quitting: 4.4   Smokeless tobacco: Never  Substance Use Topics   Alcohol use: Not Currently   Drug use: Not Currently     Allergies   Ezetimibe    Review of Systems Review of Systems  See HPI Physical Exam Triage Vital Signs ED Triage Vitals  Encounter Vitals Group      BP 12/04/23 0847 129/87     Girls Systolic BP Percentile --      Girls Diastolic BP Percentile --      Boys Systolic BP Percentile --      Boys Diastolic BP Percentile --      Pulse Rate 12/04/23 0847 80     Resp 12/04/23 0847 20     Temp 12/04/23 0847 98.5 F (36.9 C)     Temp Source 12/04/23 0847 Oral     SpO2 12/04/23 0847 97 %     Weight --      Height --      Head Circumference --      Peak Flow --      Pain Score 12/04/23 0850 1     Pain Loc --      Pain Education --      Exclude from Growth Chart --    No data found.  Updated Vital Signs BP 129/87   Pulse 80   Temp 98.5 F (36.9 C) (Oral)   Resp 20   SpO2 97%   Visual Acuity Right Eye Distance:   Left Eye Distance:   Bilateral Distance:    Right Eye Near:   Left Eye Near:    Bilateral Near:     Physical Exam Constitutional:      Appearance: Normal appearance.  Pulmonary:     Effort: Pulmonary effort is normal.  Musculoskeletal:        General: Normal range of motion.       Legs:  Skin:    General: Skin is warm and dry.  Neurological:     Mental Status: He is alert.  Psychiatric:        Mood and Affect: Mood normal.      UC Treatments / Results  Labs (all labs ordered are listed, but only abnormal results are displayed) Labs Reviewed - No data to display  EKG   Radiology DG Tibia/Fibula Right Result Date: 12/04/2023 EXAM: 2 VIEW(S) XRAY OF THE RIGHT TIBIA AND FIBULA 12/04/2023 09:24:00 AM COMPARISON: None available. CLINICAL HISTORY: Shin pain. States on Thursday, began having pain to anterior right lower leg. Points to area of pain from top of ankle with radiation distally approx 4-5 inches. No known injury. Does stand a lot during the day. Patient states he does wear supportive shoes but the pair he has been wearing recently  are ending their time of usefulness. FINDINGS: BONES AND JOINTS: No acute fracture. No focal osseous lesion. No joint dislocation. SOFT TISSUES: The soft tissues are  unremarkable. IMPRESSION: 1. No significant abnormality. Electronically signed by: Waddell Calk MD 12/04/2023 09:30 AM EDT RP Workstation: [id number]    Procedures Procedures (including critical care time)  Medications Ordered in UC Medications - No data to display  Initial Impression / Assessment and Plan / UC Course  I have reviewed the triage vital signs and the nursing notes.  Pertinent labs & imaging results that were available during my care of the patient were reviewed by me and considered in my medical decision making (see chart for details).     RLL Arthralgia- x ray normal. Most likely tendonitis from overuse.  RICE. Tylenol  for pain. Wear supportive shoes.  Follow up with PCP as needed.  Final Clinical Impressions(s) / UC Diagnoses   Final diagnoses:  Arthralgia of right lower leg     Discharge Instructions      You x ray was normal. I believe that the pain is due to overuse or tendonitis. You can take Tylenol  for the pain, rest, ice.  Make sure that you are wearing supportive shoes.  See PCP as needed if problem worsens.     ED Prescriptions   None    PDMP not reviewed this encounter.   Adah Wilbert LABOR, FNP 12/04/23 [phone]

## 2023-12-04 NOTE — Discharge Instructions (Signed)
 You x ray was normal. I believe that the pain is due to overuse or tendonitis. You can take Tylenol  for the pain, rest, ice.  Make sure that you are wearing supportive shoes.  See PCP as needed if problem worsens.

## 2023-12-14 ENCOUNTER — Encounter: Payer: Self-pay | Admitting: Cardiology

## 2024-01-04 ENCOUNTER — Other Ambulatory Visit: Payer: Self-pay | Admitting: Cardiology

## 2024-01-22 ENCOUNTER — Encounter: Payer: Self-pay | Admitting: Cardiology

## 2024-01-22 ENCOUNTER — Encounter (HOSPITAL_COMMUNITY): Payer: Self-pay | Admitting: Internal Medicine

## 2024-01-22 DIAGNOSIS — E785 Hyperlipidemia, unspecified: Secondary | ICD-10-CM

## 2024-01-22 DIAGNOSIS — I2510 Atherosclerotic heart disease of native coronary artery without angina pectoris: Secondary | ICD-10-CM

## 2024-01-22 DIAGNOSIS — M79604 Pain in right leg: Secondary | ICD-10-CM

## 2024-01-22 DIAGNOSIS — R5383 Other fatigue: Secondary | ICD-10-CM

## 2024-01-23 NOTE — Telephone Encounter (Signed)
 It is quite possible.  The only way we can know for sure is to hold the rosuvastatin  for a month to reassess.  But lets get a baseline lipid panel and liver function studies as well as CK levels as we know over starting from.  I have not seen him since 2023 as he has been followed by Dr. Cherrie and the heart failure clinic.  He theoretically should be seen by me intermittently for his CAD.  Edward Clay, MD

## 2024-01-23 NOTE — Telephone Encounter (Signed)
 Last OV note stated lipids are managed by Dr. Anner, including rosuvastatin , Repatha  and previously Zetia . Patient also reached out to that office with this same question. Per chart notes, request has already been forwarded to Dr. Anner and that pharmacy team for advisement. Given that they are managing therapy, would prefer them to weigh in on side effects but our office can be available as needed.

## 2024-01-24 ENCOUNTER — Other Ambulatory Visit: Payer: Self-pay | Admitting: Cardiology

## 2024-01-31 ENCOUNTER — Other Ambulatory Visit: Payer: Self-pay | Admitting: Cardiology

## 2024-01-31 DIAGNOSIS — E785 Hyperlipidemia, unspecified: Secondary | ICD-10-CM

## 2024-01-31 DIAGNOSIS — I2510 Atherosclerotic heart disease of native coronary artery without angina pectoris: Secondary | ICD-10-CM

## 2024-01-31 DIAGNOSIS — I2102 ST elevation (STEMI) myocardial infarction involving left anterior descending coronary artery: Secondary | ICD-10-CM

## 2024-02-28 LAB — LIPID PANEL
Chol/HDL Ratio: 2.2 ratio (ref 0.0–5.0)
Cholesterol, Total: 112 mg/dL (ref 100–199)
HDL: 51 mg/dL (ref >39)
LDL Chol Calc (NIH): 35 mg/dL (ref 0–99)
Triglycerides: 156 mg/dL — ABNORMAL HIGH (ref 0–149)
VLDL Cholesterol Cal: 26 mg/dL (ref 5–40)

## 2024-02-28 LAB — HEPATIC FUNCTION PANEL
ALT: 25 IU/L (ref 0–44)
AST: 34 IU/L (ref 0–40)
Albumin: 4.4 g/dL (ref 3.8–4.9)
Alkaline Phosphatase: 96 IU/L (ref 47–123)
Bilirubin Total: 0.7 mg/dL (ref 0.0–1.2)
Bilirubin, Direct: 0.1 mg/dL (ref 0.00–0.40)
Total Protein: 7 g/dL (ref 6.0–8.5)

## 2024-02-28 LAB — CK: Total CK: 187 U/L (ref 41–331)

## 2024-02-29 ENCOUNTER — Ambulatory Visit: Payer: Self-pay | Admitting: Cardiology

## 2024-03-03 ENCOUNTER — Other Ambulatory Visit (HOSPITAL_COMMUNITY): Payer: Self-pay | Admitting: Internal Medicine

## 2024-03-05 ENCOUNTER — Other Ambulatory Visit: Payer: Self-pay | Admitting: Cardiology

## 2024-03-05 ENCOUNTER — Telehealth (HOSPITAL_BASED_OUTPATIENT_CLINIC_OR_DEPARTMENT_OTHER): Payer: Self-pay | Admitting: *Deleted

## 2024-03-05 NOTE — Telephone Encounter (Signed)
   Pre-operative Risk Assessment    Patient Name: Edward Mendoza  DOB: [birth year] MRN: [id number]   Date of last office visit: 03/23/23 DR. CHERRIE Date of next office visit: 03/14/24 DR. CHERRIE AND 03/21/24 DR. HARDING   Request for Surgical Clearance    Procedure:  LAP CHOLE  Date of Surgery:  Clearance 03/26/24                                Surgeon:  DR. JOESPH Surgeon's Group or Practice Name:  [facility] Phone number:  [phone] Fax number:  [phone]; ATTN: SHARON   Type of Clearance Requested:   - Medical  - Pharmacy:  Hold Clopidogrel  (Plavix ) and Apixaban  (Eliquis )     Type of Anesthesia:  General    Additional requests/questions:    Bonney Niels Jest   03/05/2024, 4:22 PM

## 2024-03-05 NOTE — Telephone Encounter (Signed)
   Name:  Edward Mendoza  DOB:  10-31-1970  MRN:  [id number]   Primary Cardiologist: Alm Clay, MD  Chart reviewed as part of pre-operative protocol coverage. Edward Mendoza was last seen on 03/23/2023 by Dr. Bensimhon.    Due to time since last visit Edward Mendoza will require a follow-up visit for further pre-operative risk assessment (03/14/24 DR. CHERRIE AND 03/21/24 DR. HARDING).  Pre-op covering staff: - Please schedule appointment and call patient to inform them. If patient already had an upcoming appointment within acceptable timeframe, please add pre-op clearance to the appointment notes so provider is aware. - Please contact requesting surgeon's office via preferred method (i.e, phone, fax) to inform them of need for appointment prior to surgery.  Edward JAYSON Hoover, NP 03/05/2024, 4:35 PM

## 2024-03-06 MED ORDER — SPIRONOLACTONE 25 MG PO TABS: 25.0000 mg | ORAL_TABLET | Freq: Every day | ORAL | 1 refills | Status: AC

## 2024-03-06 NOTE — Telephone Encounter (Signed)
 This is a CHF pt

## 2024-03-12 ENCOUNTER — Encounter (HOSPITAL_COMMUNITY): Admitting: Internal Medicine

## 2024-03-12 ENCOUNTER — Ambulatory Visit (HOSPITAL_COMMUNITY)
Admission: RE | Admit: 2024-03-12 | Discharge: 2024-03-12 | Disposition: A | Source: Ambulatory Visit | Attending: Internal Medicine | Admitting: Internal Medicine

## 2024-03-12 DIAGNOSIS — E785 Hyperlipidemia, unspecified: Secondary | ICD-10-CM | POA: Diagnosis not present

## 2024-03-12 DIAGNOSIS — I252 Old myocardial infarction: Secondary | ICD-10-CM | POA: Diagnosis not present

## 2024-03-12 DIAGNOSIS — Z006 Encounter for examination for normal comparison and control in clinical research program: Secondary | ICD-10-CM

## 2024-03-12 DIAGNOSIS — I5020 Unspecified systolic (congestive) heart failure: Secondary | ICD-10-CM | POA: Diagnosis not present

## 2024-03-12 DIAGNOSIS — Z87891 Personal history of nicotine dependence: Secondary | ICD-10-CM | POA: Diagnosis not present

## 2024-03-12 DIAGNOSIS — I358 Other nonrheumatic aortic valve disorders: Secondary | ICD-10-CM | POA: Diagnosis not present

## 2024-03-12 DIAGNOSIS — I2510 Atherosclerotic heart disease of native coronary artery without angina pectoris: Secondary | ICD-10-CM | POA: Diagnosis not present

## 2024-03-12 LAB — ECHOCARDIOGRAM COMPLETE
Area-P 1/2: 3.91 cm2
Calc EF: 40.7 %
S' Lateral: 5.2 cm
Single Plane A2C EF: 50.1 %
Single Plane A4C EF: 33.7 %

## 2024-03-12 MED ORDER — PERFLUTREN LIPID MICROSPHERE
1.0000 mL | INTRAVENOUS | Status: AC | PRN
  Administered 2024-03-12: 3 mL via INTRAVENOUS

## 2024-03-12 NOTE — Telephone Encounter (Signed)
 Patient with diagnosis of aflutter on Eliquis  for anticoagulation.    Procedure: LAP CHOLE  Date of procedure: 03/26/24   CHA2DS2-VASc Score = 4   This indicates a 4.8% annual risk of stroke. The patient's score is based upon: CHF History: 1 HTN History: 0 Diabetes History: 0 Stroke History: 2 Vascular Disease History: 1 Age Score: 0 Gender Score: 0      Strokes seen on MRI after VF arrest   Patient needs CBC and BMP done for clearance  **This guidance is not considered finalized until pre-operative APP has relayed final recommendations.**

## 2024-03-12 NOTE — Research (Signed)
 SITE: [id number]     Subject # [id number]  Subprotocol: A  Inclusion Criteria  Patients who meet all of the following criteria are eligible for enrollment as study participants:  Yes No  Age > 51 years old X   Eligible to wear Holter Study X    Exclusion Criteria  Patients who meet any of these criteria are not eligible for enrollment as study participants: Yes No  1. Receiving any mechanical (respiratory or circulatory) or renal support therapy at Screening or during Visit #1.  X  2.  Any other conditions that in the opinion of the investigators are likely to prevent compliance with the study protocol or pose a safety concern if the subject participates in the study.  X  3. Poor tolerance, namely susceptible to severe skin allergies from ECG adhesive patch application.  X   Protocol: REV H    60 minute start window         Cor device must be applied, and the study initiated, no later than 60 minutes of completing the Echocardiogram                             HH:MM  Echo completion time  13:40  2.   Cor Study start time  13:45   30-Minute execution window  Once Cor Monitoring begins, 3 QT Med ECGs and the 15-minute rest period must be completed within a 30 minute window     HH:MM  3. QT Med ECG Completion time  13:56  4. Start of 15-Min sitting rest period  13:56  5. End of 15-Min rest period  14:11  6. Time of device removal  14:18   *Continue to use the Mobile App Event feature to log the Rest period windows and follow instructions on the EF-ACT Clinical Trial  Patient Instruction Card.  Describe any anomalies in Protocol execution in the Protocol Deviation Log    Residential Zip [id number] (First 3 digits ONLY)                                           [facility] Informed Consent   Subject Name: Edward Mendoza  Subject met inclusion and exclusion criteria.  The informed consent form, study requirements and expectations were reviewed with the subject. Subject had opportunity to  read consent and questions and concerns were addressed prior to the signing of the consent form.  The subject verbalized understanding of the trial requirements.  The subject agreed to participate in the [facility] EF trial and signed the informed consent at 13:43 on 12-Mar-2024.  The informed consent was obtained prior to performance of any protocol-specific procedures for the subject.  A copy of the signed informed consent was given to the subject and a copy was placed in the subject's medical record.   Edward Mendoza          Current Outpatient Medications:    apixaban  (ELIQUIS ) 5 MG TABS tablet, Take 1 tablet (5 mg total) by mouth 2 (two) times daily., Disp: 60 tablet, Rfl: 5   carvedilol  (COREG ) 3.125 MG tablet, Take 1 tablet (3.125 mg total) by mouth 2 (two) times daily with a meal., Disp: 60 tablet, Rfl: 5   citalopram  (CELEXA ) 10 MG tablet, Take 1 tablet (10 mg total) by mouth daily., Disp: 90 tablet, Rfl: 3  citalopram  (CELEXA ) 20 MG tablet, Take 20 mg by mouth daily., Disp: , Rfl:    clopidogrel  (PLAVIX ) 75 MG tablet, Take 1 tablet (75 mg total) by mouth daily., Disp: 90 tablet, Rfl: 2   Coenzyme Q-10 200 MG CAPS, Take 1 capsule by mouth daily at 6 (six) AM., Disp: , Rfl:    Evolocumab  (REPATHA  SURECLICK) 140 MG/ML SOAJ, Inject 140 mg into the skin every 14 (fourteen) days., Disp: 6 mL, Rfl: 1   FARXIGA  10 MG TABS tablet, TAKE ONE TABLET (10MG  TOTAL) BY MOUTH DAILY BEFORE BREAKFAST, Disp: 90 tablet, Rfl: 1   losartan  (COZAAR ) 25 MG tablet, Take 1 tablet (25 mg total) by mouth at bedtime., Disp: 90 tablet, Rfl: 3   nitroGLYCERIN  (NITROSTAT ) 0.4 MG SL tablet, DISSOLVE 1 TABLET UNDER TONGUE EVERY 5 MINUTES AS NEEDED FOR CHEST PAIN, Disp: 25 tablet, Rfl: 3   pantoprazole  (PROTONIX ) 40 MG tablet, TAKE ONE TABLET BY MOUTH DAILY, Disp: 90 tablet, Rfl: 0   rosuvastatin  (CRESTOR ) 40 MG tablet, Take 1 tablet (40 mg total) by mouth daily., Disp: 90 tablet, Rfl: 3   spironolactone  (ALDACTONE ) 25 MG  tablet, Take 1 tablet (25 mg total) by mouth daily., Disp: 90 tablet, Rfl: 1

## 2024-03-14 ENCOUNTER — Encounter (HOSPITAL_COMMUNITY): Payer: Self-pay | Admitting: Internal Medicine

## 2024-03-14 ENCOUNTER — Ambulatory Visit (HOSPITAL_COMMUNITY)
Admission: RE | Admit: 2024-03-14 | Discharge: 2024-03-14 | Disposition: A | Discharge status: Home / Self Care | Source: Ambulatory Visit | Attending: Internal Medicine | Admitting: Internal Medicine

## 2024-03-14 VITALS — BP 120/70 | HR 77 | Wt 246.0 lb

## 2024-03-14 DIAGNOSIS — Z9861 Coronary angioplasty status: Secondary | ICD-10-CM

## 2024-03-14 DIAGNOSIS — Z8673 Personal history of transient ischemic attack (TIA), and cerebral infarction without residual deficits: Secondary | ICD-10-CM | POA: Diagnosis not present

## 2024-03-14 DIAGNOSIS — I2510 Atherosclerotic heart disease of native coronary artery without angina pectoris: Secondary | ICD-10-CM | POA: Diagnosis not present

## 2024-03-14 DIAGNOSIS — F419 Anxiety disorder, unspecified: Secondary | ICD-10-CM | POA: Insufficient documentation

## 2024-03-14 DIAGNOSIS — Z79899 Other long term (current) drug therapy: Secondary | ICD-10-CM | POA: Insufficient documentation

## 2024-03-14 DIAGNOSIS — Z955 Presence of coronary angioplasty implant and graft: Secondary | ICD-10-CM | POA: Insufficient documentation

## 2024-03-14 DIAGNOSIS — I255 Ischemic cardiomyopathy: Secondary | ICD-10-CM | POA: Diagnosis not present

## 2024-03-14 DIAGNOSIS — K8020 Calculus of gallbladder without cholecystitis without obstruction: Secondary | ICD-10-CM | POA: Insufficient documentation

## 2024-03-14 DIAGNOSIS — I252 Old myocardial infarction: Secondary | ICD-10-CM | POA: Diagnosis not present

## 2024-03-14 DIAGNOSIS — I480 Paroxysmal atrial fibrillation: Secondary | ICD-10-CM

## 2024-03-14 DIAGNOSIS — F4310 Post-traumatic stress disorder, unspecified: Secondary | ICD-10-CM | POA: Diagnosis not present

## 2024-03-14 DIAGNOSIS — Z8674 Personal history of sudden cardiac arrest: Secondary | ICD-10-CM | POA: Insufficient documentation

## 2024-03-14 DIAGNOSIS — Z7901 Long term (current) use of anticoagulants: Secondary | ICD-10-CM | POA: Diagnosis not present

## 2024-03-14 DIAGNOSIS — Z7902 Long term (current) use of antithrombotics/antiplatelets: Secondary | ICD-10-CM | POA: Diagnosis not present

## 2024-03-14 DIAGNOSIS — Z87891 Personal history of nicotine dependence: Secondary | ICD-10-CM | POA: Insufficient documentation

## 2024-03-14 DIAGNOSIS — I5022 Chronic systolic (congestive) heart failure: Secondary | ICD-10-CM | POA: Diagnosis present

## 2024-03-14 DIAGNOSIS — Z01810 Encounter for preprocedural cardiovascular examination: Secondary | ICD-10-CM

## 2024-03-14 DIAGNOSIS — K219 Gastro-esophageal reflux disease without esophagitis: Secondary | ICD-10-CM | POA: Diagnosis not present

## 2024-03-14 NOTE — Progress Notes (Signed)
 PCP: Cardiology: Dr Anner Primary HF Cardiologist: Dr Cherrie   HEART FAILURE CLINIC NOTE  HPI: [name] is a 53 yo male with history of CAD, STEMI, VF arrest 07/11/19 with iCM and CVAs.   In 3/21 presented with anterior STEMI with witnessed VF. He had a total of 40-45 minutes of ACLS before ROSC  LHC showed 99% proximal LAD occlusion treated with PCI/DES to LAD. No other significant disease. He was started on DAPT with Aspirin  and Brilinta . Brilinta  was stopped and plavix  started with the addition of Eliquis  (for aflutter). TEE DC-CV 3/20 with conversion to NSR EF 30-35%.    cMRI 9/21 EF 39% RV normal. Mild to moderate MR Echo 5/22 EF 30-35% No MR  Zio 9/22 1. Sinus rhythm - avg HR of 70 bpm Rhythm. 2. Three runs of SVT occurred, the run with the fastest interval lasting 5 beats with a max rate of 182 bpm, the longest lasting 9 beats with an avg rate of 146 bpm.  3. Rare PACs and PVCs.   Echo  07/09/22 EF 35-40% RV ok  Seen in ED 8/24 with noncardiac CP. ECG and trops normal.,  PET perfusion 9/24 EF 33% fixed defect. No TID. EF 30-35% I suggested ICD. He wants to defer for now.  Went to [place] 2 weeks ago with palpitations/skipped beats. ECG ok. Got RUQ u/s which showed gallstones.   Echo 11/25 EF 35-40% Personally reviewed  Here for f/u. Feels good. Still working FT in a physical job. Has GERD but no post-prandial pain. No further palpitations. No CP or undue SOB. Gets 2mi/day on watch. No problems with meds  ROS: All systems negative except as listed in HPI, PMH and Problem List.  SH:  Social History   Socioeconomic History   Marital status: Married    Spouse name: Not on file   Number of children: Not on file   Years of education: Not on file   Highest education level: Not on file  Occupational History   Occupation: Copy    Employer: [employer]  Tobacco Use   Smoking status: Former    Current packs/day: 0.00    Average packs/day: 1  pack/day for 21.2 years (21.2 ttl pk-yrs)    Types: Cigarettes    Start date: 2000    Quit date: 07/11/2019    Years since quitting: 4.6   Smokeless tobacco: Never  Substance and Sexual Activity   Alcohol use: Not Currently   Drug use: Not Currently   Sexual activity: Yes  Other Topics Concern   Not on file  Social History Narrative   Not on file   Social Drivers of Health   Financial Resource Strain: Low Risk  (12/16/2022)   Overall Financial Resource Strain (CARDIA)    Difficulty of Paying Living Expenses: Not hard at all  Food Insecurity: No Food Insecurity (12/16/2022)   Hunger Vital Sign    Worried About Running Out of Food in the Last Year: Never true    Ran Out of Food in the Last Year: Never true  Transportation Needs: No Transportation Needs (12/16/2022)   PRAPARE - Administrator, Civil Service (Medical): No    Lack of Transportation (Non-Medical): No  Physical Activity: Sufficiently Active (08/07/2019)   Exercise Vital Sign    Days of Exercise per Week: 7 days    Minutes of Exercise per Session: 30 min  Stress: Not on file  Social Connections: Not on file  Intimate Partner Violence:  Not on file    FH:  Family History  Family history unknown: Yes    Past Medical History:  Diagnosis Date   Acute ST elevation myocardial infarction (STEMI) involving left anterior descending coronary artery (HCC)    Presented as VF arrest -> 45 minutes CPR with ROSC => Ant STEMI: Cath = 95% thrombotic/hazy prox-mid LAD -> DES PCI. ->  Severe Ischemic Cardiomyopathy (EF improved from 15% to 35-40%).   Cardiac arrest with ventricular fibrillation (HCC) 07/11/2019   Chronic combined systolic and diastolic CHF, NYHA class 1 (HCC) 07/2019   Initial EF of 50% improved to 35-40%.  Class I-II CHF   History of multiple strokes 07/22/2019   Multiple CVAs noted on MRI post CARDIAC ARREST-prolonged CPR (innumerable punctate acute infarctions scattered throughout the cerebellum and s  bilateral cerebellar hemispheres most consistent with micro emboli) no residual symptoms.   Hyperlipidemia with target LDL less than 70 07/22/2019   Ischemic cardiomyopathy -> EF ~35-40% by CMRI 07/11/2019   following anterior MI and cardiac arrest-initial EF 15% improved to 35-40% 3 months post PCI.   Paroxysmal atrial fibrillation/a atrial flutter ->  in setting of post MI/cardiac arrest 07/18/2019   Had PAF-flutter following rewarming from [place] [employer] after cardiac arrest: TEE DCCV on 07/21/2019   Single vessel coronary disease 07/11/2019   Anterior STEMI complicated by VF arrest.  1V CAD - 95-99% p-mLAD => DES PCI: Resolute Onyx DES 3.5 x 18 (post-dilated to 4.0 mm)   Smoker 07/13/2019    Current Outpatient Medications  Medication Sig Dispense Refill   apixaban  (ELIQUIS ) 5 MG TABS tablet Take 1 tablet (5 mg total) by mouth 2 (two) times daily. 60 tablet 5   carvedilol  (COREG ) 3.125 MG tablet Take 1 tablet (3.125 mg total) by mouth 2 (two) times daily with a meal. 60 tablet 5   citalopram  (CELEXA ) 20 MG tablet Take 20 mg by mouth daily.     clopidogrel  (PLAVIX ) 75 MG tablet Take 1 tablet (75 mg total) by mouth daily. 90 tablet 2   Coenzyme Q-10 200 MG CAPS Take 1 capsule by mouth daily at 6 (six) AM.     Evolocumab  (REPATHA  SURECLICK) 140 MG/ML SOAJ Inject 140 mg into the skin every 14 (fourteen) days. 6 mL 1   FARXIGA  10 MG TABS tablet TAKE ONE TABLET (10MG  TOTAL) BY MOUTH DAILY BEFORE BREAKFAST 90 tablet 1   losartan  (COZAAR ) 25 MG tablet Take 1 tablet (25 mg total) by mouth at bedtime. 90 tablet 3   nitroGLYCERIN  (NITROSTAT ) 0.4 MG SL tablet DISSOLVE 1 TABLET UNDER TONGUE EVERY 5 MINUTES AS NEEDED FOR CHEST PAIN 25 tablet 3   pantoprazole  (PROTONIX ) 40 MG tablet Take 40 mg by mouth 2 (two) times daily.     rosuvastatin  (CRESTOR ) 40 MG tablet Take 1 tablet (40 mg total) by mouth daily. 90 tablet 3   spironolactone  (ALDACTONE ) 25 MG tablet Take 1 tablet (25 mg total) by mouth daily. 90  tablet 1   No current facility-administered medications for this encounter.    Vitals:   03/14/24 1459  BP: 120/70  Pulse: 77  SpO2: 96%  Weight: 111.6 kg (246 lb)     PHYSICAL EXAM: General:  Sitting up in bed. No resp difficulty HEENT: normal Neck: supple. no JVD.  Cor: Regular rate & rhythm. No rubs, gallops or murmurs. Lungs: clear Abdomen: soft, nontender, nondistended.Good bowel sounds. Extremities: no cyanosis, clubbing, rash, edema Neuro: alert & orientedx3, cranial nerves grossly intact. moves all 4  extremities w/o difficulty. Affect pleasant    ASSESSMENT & PLAN:  1. CAD - LHC 3/21 showed 99% proximal LAD thrombotic occlusion, s/p PCI + DES placement. - Very active. No s/s angina  - Continue Plavix  - Lipids managed by Dr. Anner -> now on Crestor , Repatha  and Zetia  - PET perfusion 9/24 EF 33% fixed defect. No TID  2. Chronic Systolic Heart Failure  -Echo 6/89/78 EF 15%. RV systolic function mildly reduced  - f/u echo on 3/13 LVEF 20-25%, TEE 07/21/19 EF 30-35% - Echo 6/21 EF 35-40% - cMRI 9/21 EF 39% RV normal. Mild to moderate MR - Echo 5/22 EF 30-35% (I thought 35-40% with anterior and anteroseptal AK) - Echo  07/09/22 EF 35-40% RV ok  - PET perfusion 9/24 EF 33% fixed defect. No TID - Echo 10/24 EF 30-35% - Echo 11/25 EF 35-40% Personally reviewed - Doing great. NYHA I Volume ok  - Continue carvedilol  3.125 mg twice a dayFailed titration to 6.25 bid (says HR is in 60s) - Continue losartan  25 qhs (off Entresto  due to lowBP) - Continue spiro 25 mg daily.  - Continue Farxiga  10  - Has not tolerated further increases on GDMT - EF > 35% no ICD (he refused when EF 30-35%)  3. H/O VF Arrest in the setting of MI.  -Stable no recurrence  4. Multiple CVAs as on MRI  - no residual  5. Atrial flutter, post-MI - occurred post-MI, no recurrence  - Zio 9/22 no AF -Will continue Eliquis . Particularly with apical AK  6. Anxiety/PTSD - continue SSRI -  improved  7. Pre-op CV evaluation - he is at low to moderate risk for peri-op CV vcomplications with gallbladder surgery. Ok to proceed as needed  Toribio Fuel, MD  3:37 PM

## 2024-03-14 NOTE — Addendum Note (Signed)
 Encounter addended by: Buell Powell HERO, RN on: 03/14/2024 3:45 PM  Actions taken: Clinical Note Signed

## 2024-03-14 NOTE — Patient Instructions (Signed)
 Medication Changes:  None, continue current medications  Special Instructions // Education:  Do the following things EVERYDAY: Weigh yourself in the morning before breakfast. Write it down and keep it in a log. Take your medicines as prescribed Eat low salt foods--Limit salt (sodium) to 2000 mg per day.  Stay as active as you can everyday Limit all fluids for the day to less than 2 liters   Follow-Up in: 9 months (August 2026), **PLEASE CALL OUR OFFICE IN JUNE TO SCHEDULE THIS APPOINTMENT   At [facility], you and your health needs are our priority. We have a designated team specialized in the treatment of Heart Failure. This Care Team includes your primary Heart Failure Specialized Cardiologist (physician), Advanced Practice Providers (APPs- Physician Assistants and Nurse Practitioners), and Pharmacist who all work together to provide you with the care you need, when you need it.   You may see any of the following providers on your designated Care Team at your next follow up:  Dr. Toribio Fuel Dr. Ezra Shuck Dr. Odis Brownie Greig Mosses, NP Caffie Shed, [place] [facility], [place] Beckey Coe, NP Jordan Lee, NP Tinnie Redman, PharmD   Please be sure to bring in all your medications bottles to every appointment.   Need to Contact Us :  If you have any questions or concerns before your next appointment please send us  a message through [place] or call our office at [phone].    TO LEAVE A MESSAGE FOR THE NURSE SELECT OPTION 2, PLEASE LEAVE A MESSAGE INCLUDING: YOUR NAME DATE OF BIRTH CALL BACK NUMBER REASON FOR CALL**this is important as we prioritize the call backs  YOU WILL RECEIVE A CALL BACK THE SAME DAY AS LONG AS YOU CALL BEFORE 4:00 PM

## 2024-03-16 ENCOUNTER — Encounter (HOSPITAL_COMMUNITY): Payer: Self-pay | Admitting: Internal Medicine

## 2024-03-21 ENCOUNTER — Encounter: Payer: Self-pay | Admitting: Cardiology

## 2024-03-21 ENCOUNTER — Ambulatory Visit: Attending: Cardiology | Admitting: Cardiology

## 2024-03-21 VITALS — BP 114/79 | HR 77 | Ht 72.5 in | Wt 244.6 lb

## 2024-03-21 DIAGNOSIS — Z01810 Encounter for preprocedural cardiovascular examination: Secondary | ICD-10-CM | POA: Diagnosis not present

## 2024-03-21 DIAGNOSIS — I2510 Atherosclerotic heart disease of native coronary artery without angina pectoris: Secondary | ICD-10-CM

## 2024-03-21 DIAGNOSIS — I480 Paroxysmal atrial fibrillation: Secondary | ICD-10-CM | POA: Diagnosis not present

## 2024-03-21 DIAGNOSIS — I5020 Unspecified systolic (congestive) heart failure: Secondary | ICD-10-CM

## 2024-03-21 DIAGNOSIS — Z8673 Personal history of transient ischemic attack (TIA), and cerebral infarction without residual deficits: Secondary | ICD-10-CM

## 2024-03-21 DIAGNOSIS — I255 Ischemic cardiomyopathy: Secondary | ICD-10-CM | POA: Diagnosis not present

## 2024-03-21 DIAGNOSIS — F410 Panic disorder [episodic paroxysmal anxiety] without agoraphobia: Secondary | ICD-10-CM

## 2024-03-21 DIAGNOSIS — I469 Cardiac arrest, cause unspecified: Secondary | ICD-10-CM

## 2024-03-21 DIAGNOSIS — I2102 ST elevation (STEMI) myocardial infarction involving left anterior descending coronary artery: Secondary | ICD-10-CM | POA: Diagnosis not present

## 2024-03-21 DIAGNOSIS — Z9861 Coronary angioplasty status: Secondary | ICD-10-CM

## 2024-03-21 DIAGNOSIS — I4901 Ventricular fibrillation: Secondary | ICD-10-CM

## 2024-03-21 DIAGNOSIS — D6869 Other thrombophilia: Secondary | ICD-10-CM

## 2024-03-21 DIAGNOSIS — E785 Hyperlipidemia, unspecified: Secondary | ICD-10-CM

## 2024-03-21 NOTE — Progress Notes (Signed)
 Cardiology Office Note:  .   Date:  03/25/2024  ID:  Edward Mendoza, DOB 1970-08-12, MRN [id number] PCP: Clemmie Nest, MD  [facility] Providers Cardiologist:  Alm Clay, MD Cardiology APP:  Emelia Josefa HERO, NP  Advanced Heart Failure:  Toribio Fuel, MD     Chief Complaint  Patient presents with   Follow-up    2-year follow-up   Coronary Artery Disease    No angina   Cardiomyopathy    Reduced EF but no heart failure symptoms.  NYHA class I    Patient Profile: .     Edward Mendoza is a 53 y.o. male with a PMH noted below who presents here for what amounts to be a 2-year follow-up.    PMH: 07/11/19 presented with Acute Anterior STEMI complicated by witnessed VF = 40-45 min ACLS before ROSC  LHC - 99% prox LAD => DES PCI; Echo EF ~15%, Global HK; CVP 15 mmHg; Borderline C-Shock -- on low dose Levophed .  Echo 3/13: EF 20-25%, Severe HK of Entire Anterioseptal/anterior/ apical wall. Mod HK of mid-apical Inferior wall. 07/21/19: Post PCI Atrial Flutter => TEE DCCV; converted to Plavix /Eliquis  EF improved to 30-35% post DCCV. Event monitor - no recurrent Aflutter/Fib.  Echo 10/24/19:  EF 35-40% - Mid-Apical Anteroseptal / Apical Akinesis. Normal RAP. CMRI 01/11/20:  LVEF ~39%. Akinesis of basal-mid inferoseptal, anteroseptal & apical anterior-septal-inferior walls as well as tru apex.  Paradoxical septal WM. 75-100% late enhancement - FIXED INFARCT. Normal RV function. Mod LA dilation. Mild-mod MR & TR.  Echo 09/18/2020: EF ~30-35% (~ slightly worse than 10/2019). 14 D Zio: 3 atrial runs 5-9 beats. Rare PACs & PVCs.  Symptomatic PVCs.     I last saw Edward Mendoza in November 2023.  Has been followed pretty closely by Dr. Midge that time he was having NYHA class I CHF with no angina symptoms and a relatively vigorous lifestyle (walking 5 to 7 miles despite every day, also a lot of heavy lifting with his job.  Would also like couple times a week for about 5 miles  along with some resistance training).  No breakthrough spells of A-fib, and stable with Eliquis .  His EF at that time was 25 to 30% over the years low blood pressures admitted difficult to titrate up his GDMT for cardiomyopathy.  He was not able to tolerate Entresto  and is now on losartan  25 mg daily and only low-dose carvedilol  325 mg twice daily along with Farxiga  10 mg daily and spironolactone  25 mg daily.  He has thankfully has not required any diuretic.  He is on Repatha  140 mg daily along with rosuvastatin  40 midodrine daily for lipids.  He was actually just seen by Dr. Fuel on November 12 after a follow-up echocardiogram.  Noted you are still working full-time at a very physical job.  To some GERD but no actual postprandial angina or exertional angina.  No palpitations.  No chest pain.  Walking 7 miles a day per her watch.  Also exercise routinely.  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  No medication changes made.  There was discussion about possible gallbladder surgery based on the surreptitious findings of gallstones.  Dr. Bensimhon felt that he would be relatively low risk for gallbladder surgery and okay to proceed.  Subjective  Discussed the use of AI scribe software for clinical note transcription with the patient, who gave verbal consent to proceed.  History of Present Illness Edward Mendoza  is a 53 year old male with myocardial infarction and heart failure who presents for follow-up of his cardiac condition.  He had an echocardiogram in March 2023, which coincided with a heart attack. He has been under the care of his current cardiologist and Dr. Odis Savant for his cardiac condition. He mentions difficulty in scheduling appointments due to availability issues.  He recalls having a heart attack in March 2021, initially mistaking his symptoms for indigestion. He was hospitalized for five days following the event. His ejection fraction was 35-40% as of his last  echocardiogram on March 12, 2024. A stress test in September 2024 indicated an ejection fraction of 30-35% and findings consistent with a prior infarction.  Three weeks ago, he experienced unusual sensations while making dinner, which led him to the ER. Gallstones were found on ultrasound after his ER visit. An ultrasound confirmed the presence of gallstones, but he has not experienced any gallbladder-related symptoms.  He is currently on carvedilol  3.125 mg twice daily, losartan  25 mg daily, Farxiga  10 mg daily, spironolactone  25 mg daily, and Eliquis . He previously had issues with higher doses of carvedilol  and Entresto  due to low energy and blood pressure. He has been stable on his current regimen.  He walks seven to eight miles daily as part of his work routine and has no history of diabetes. He has experienced panic attacks, particularly when feeling flutters or gastric sensations, which he attributes to anxiety post-heart attack. He has been working on managing these symptoms better this year.  No current palpitations, arrhythmias, blood in stool, or blood in urine. No recent episodes of low blood pressure.    Objective   Current Meds  Medication Sig   apixaban  (ELIQUIS ) 5 MG TABS tablet Take 1 tablet (5 mg total) by mouth 2 (two) times daily.   carvedilol  (COREG ) 3.125 MG tablet Take 1 tablet (3.125 mg total) by mouth 2 (two) times daily with a meal.   citalopram  (CELEXA ) 20 MG tablet Take 20 mg by mouth daily.   clopidogrel  (PLAVIX ) 75 MG tablet Take 1 tablet (75 mg total) by mouth daily.   Coenzyme Q-10 200 MG CAPS Take 1 capsule by mouth daily at 6 (six) AM.   Evolocumab  (REPATHA  SURECLICK) 140 MG/ML SOAJ Inject 140 mg into the skin every 14 (fourteen) days.   FARXIGA  10 MG TABS tablet TAKE ONE TABLET (10MG  TOTAL) BY MOUTH DAILY BEFORE BREAKFAST   losartan  (COZAAR ) 25 MG tablet Take 1 tablet (25 mg total) by mouth at bedtime.   nitroGLYCERIN  (NITROSTAT ) 0.4 MG SL tablet DISSOLVE 1  TABLET UNDER TONGUE EVERY 5 MINUTES AS NEEDED FOR CHEST PAIN   pantoprazole  (PROTONIX ) 40 MG tablet Take 40 mg by mouth 2 (two) times daily.   rosuvastatin  (CRESTOR ) 40 MG tablet Take 1 tablet (40 mg total) by mouth daily.   spironolactone  (ALDACTONE ) 25 MG tablet Take 1 tablet (25 mg total) by mouth daily.    Studies Reviewed: SABRA   EKG Interpretation Date/Time:  Wednesday March 21 2024 15:46:50 EST Ventricular Rate:  77 PR Interval:  172 QRS Duration:  88 QT Interval:  358 QTC Calculation: 405 R Axis:   5  Text Interpretation: Normal sinus rhythm Anteroseptal infarct (cited on or before 16-Dec-2022) When compared with ECG of 14-Mar-2024 15:05, QRS axis Shifted right Confirmed by Anner Lenis ([id number]) on 03/21/2024 4:59:39 PM   Lab Results  Component Value Date   CHOL 112 02/27/2024   HDL 51 02/27/2024   LDLCALC 35 02/27/2024  TRIG 156 (H) 02/27/2024   CHOLHDL 2.2 02/27/2024   Lab Results  Component Value Date   NA 137 12/11/2022   K 4.3 12/11/2022   CREATININE 1.29 (H) 12/11/2022   GFRNONAA >60 12/11/2022   GLUCOSE 104 (H) 12/11/2022   Lab Results  Component Value Date   WBC 9.1 12/11/2022   HGB 14.9 12/11/2022   HCT 45.7 12/11/2022   MCV 94.4 12/11/2022   PLT 252 12/11/2022   Lab Results  Component Value Date   HGBA1C 5.4 07/11/2019   Echocardiogram: LVEF 30 to 35%.  Akinetic mid apical, inferoseptal, apical and anteroseptal and anterior wall.  Hypokinetic basal to mid anteroseptal and anterior wall.  Normal valves.  Mildly elevated RAP of 8 mmHg.  (02/07/2023)  Echocardiogram : LVEF 35 to 40%.  Mildly decreased function.  Anterior septal, apical anterior and apical akinesis.  Hypokinetic anterior, apical lateral, mid inferoseptal and apical inferior as well as basal inferoseptal wall.  Indeterminate diastolic function.  Normal LA size.  Normal RV.  Mild RA dilation.  AV calcification but no significant sclerosis.  Slightly improved EF compared to previous.   (03/12/2024)     PET perfusion 9/24 EF 33% f large fixed perfusion deficit in the apical to basal anterior, anterolateral, septal and apical wall.  Severely enlarged/dilated LV.  No TID. EF 30-35% suggestive of ICM. ==> Patient opted to defer ICD  Cath-PCI: Ant STEMI - 95% p-mLAD => DES PCI (Resolute Onyx 3.5 x 18 => 4.0 mm). EF ~35-40% (on vasopressors).  (07/11/2019) Diagnostic       Intervention    Risk Assessment/Calculations:     This patients CHA2DS2-VASc Score and unadjusted Ischemic Stroke Rate (% per year) is equal to 4.8 % stroke rate/year from a score of 4  Above score calculated as 1 point each if present [CHF, HTN, DM, Vascular=MI/PAD/Aortic Plaque, Age if 65-74, or Male] Above score calculated as 2 points each if present [Age > 75, or Stroke/TIA/TE]        Edward Mendoza perioperative risk of a major cardiac event is 6.6% according to the Revised Cardiac Risk Index (RCRI).  Therefore, he is at high risk for perioperative complications -> simply based on prior MI, reduced EF and postarrest related strokes.  However all of these are stable and in the past.  Therefore, clinically would not be high risk.   His functional capacity is excellent at 9.89 METs according to the Duke Activity Status Index (DASI). Recommendations: According to ACC/AHA guidelines, no further cardiovascular testing needed.  The patient may proceed to surgery at acceptable risk.   Antiplatelet and/or Anticoagulation Recommendations: Patient is not on aspirin  and Plavix  has been discontinued Eliquis  (Apixaban ) can be held for 2 to 3 days prior to surgery.  Please resume post op when felt to be safe.    Physical Exam:   VS:  BP 114/79 (BP Location: Left Arm, Patient Position: Sitting, Cuff Size: Large)   Pulse 77   Ht 6' 0.5 (1.842 m)   Wt 244 lb 9.6 oz (110.9 kg)   SpO2 96%   BMI 32.72 kg/m    Wt Readings from Last 3 Encounters:  03/21/24 244 lb 9.6 oz (110.9 kg)  03/14/24 246 lb (111.6 kg)   03/23/23 244 lb (110.7 kg)     GEN: Well nourished, well developed in no acute distress; healthy-appearing.  Well-groomed. NECK: No JVD; No carotid bruits CARDIAC: Normal S1, S2; RRR, no murmurs, rubs, gallops RESPIRATORY:  Clear to auscultation without rales,  wheezing or rhonchi ; nonlabored, good air movement. ABDOMEN: Soft, non-tender, non-distended EXTREMITIES:  No edema; No deformity      ASSESSMENT AND PLAN: .    Problem List Items Addressed This Visit       Cardiology Problems   CAD S/P DES PCI of LAD (Chronic)   Single-vessel CAD with LAD PCI On combination 40 mg rosuvastatin  plus Repatha  for lipids along with 3.25 mg twice daily carvedilol  and 5 mg losartan  for BP control. He is> 4.5 years post-stent. Stent likely healed. - Discontinue Plavix . - Continue Eliquis .      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 EF did not improve above the 35% range-remains at the 30 to 35%.  Despite conversations with Dr. Bensimhon in the past, patient has declined ICD.  Has not had any breakthrough arrhythmias.  Continue aggressive management of cardiomyopathy and CAD.      HFrEF (heart failure with reduced ejection fraction) (HCC)-NYHA Class I symptoms, EF 39% by CMR (Chronic)   Ejection fraction improved to 35-40%. No arrhythmias or heart failure symptoms => NYHA Class I.   Blood pressure stable, euvolemic.  Has never required furosemide . - Continue carvedilol  3.125 mg BID. - Continue losartan  25 mg daily.  (Unable to tolerate Entresto ) - Continue Farxiga  10 mg daily. - Continue spironolactone  25 mg daily.      Hypercoagulable state due to paroxysmal atrial fibrillation (HCC)   Remains on Eliquis  5 mg twice daily for CHA2DS2-VASc or 4.  Stroke prophylaxis. We are stopping Plavix  now since he is far enough out from his MI.  Okay to hold Eliquis  2 to 3 days preop for surgeries or  procedures.      Hyperlipidemia with target low density lipoprotein (LDL) cholesterol less than 55 mg/dL (Chronic)   STEMI lipid control based on last labs with LDL 35.  This is with a combination of rosuvastatin  40 [place] daily and Repatha  140 mg every 2 weeks. - Continue current regimen.      Ischemic cardiomyopathy -> following anterior MI and cardiac arrest-initial EF 15% improved to 35-40% 3 months post PCI. (Chronic)   Significant reduced EF of the anterior MI.  EF is 30 to 35% on most recent evaluation both by echocardiogram and stress PET.  Patient has declined ICD on several occasions. GDMT somewhat limited by low blood pressures-not able to tolerate Entresto , but is not requiring any diuretic.      Paroxysmal atrial fibrillation/a atrial flutter ->  in setting of post MI/cardiac arrest - Primary (Chronic)   He had PAF in the initial recovery phases from his MI but is not had any further spells. No recent arrhythmia episodes. - Continue carvedilol  3.25 mg twice daily - Continue Eliquis  5 mg twice daily.      Relevant Orders   EKG 12-Lead (Completed)   ST elevation myocardial infarction (STEMI) involving left anterior descending (LAD) coronary artery in recovery phase (HCC) (Chronic)   Over 4-1/2 years out from large anterior MI with VF arrest and prolonged CPR with ROSC. Found to have subtotal occluded LAD treated with DES PCI, but despite that he essentially had a completed anterior infarct.  However the EF did improve from 15% up to 30 to 35% now. He now lives an active lifestyle with no active angina or CHF symptoms-NYHA class I CHF        Other   History of multiple strokes (Chronic)  Multiple CVAs noted postarrest likely from prolonged CPR with global ischemia.  No residual effect. Quite possible that he had some of these related to his A-fib.  Remains on Eliquis .      Panic attacks   Occasional panic attacks managed with lifestyle modifications.  Somewhat of a  delayed PTSD response. - Continue current management and support.      Preoperative cardiovascular examination   Edward Mendoza perioperative risk of a major cardiac event is 6.6% according to the Revised Cardiac Risk Index (RCRI).  Therefore, he is at high risk for perioperative complications -> simply based on prior MI, reduced EF and postarrest related strokes.  However all of these are stable and in the past.  Therefore, clinically would not be high risk.   His functional capacity is excellent at 9.89 METs according to the Duke Activity Status Index (DASI). Recommendations: According to ACC/AHA guidelines, no further cardiovascular testing needed.  The patient may proceed to surgery at acceptable risk.   Antiplatelet and/or Anticoagulation Recommendations: Patient is not on aspirin  and Plavix  has been discontinued Eliquis  (Apixaban ) can be held for 2 to 3 days prior to surgery.  Please resume post op when felt to be safe.                Follow-Up: Return in about 13 months (around 04/20/2025) for Routine follow up with me, [employer].  I spent 64 minutes in the care of Edward Mendoza today including reviewing labs (1 minute), reviewing studies (I reviewed his cath report, stress PET, several echocardiograms as well as a CT scan showing gallstones-15 minutes), face to face time discussing treatment options (27 minutes including discussion of last 2 years since I seen him, his evaluation finding gallstones and preoperative risk.  We also talked about medical management and follow-up plans), reviewing records from My previous notes, Dr. Nelle notes, and notes from [facility] (10 minutes), 11 minutes dictating, and documenting in the encounter.      Signed, Alm MICAEL Clay, MD, MS Alm Clay, M.D., M.S. Interventional Cardiologist  [facility] Pager # [phone]

## 2024-03-21 NOTE — Patient Instructions (Signed)
 Medication Instructions:  Your physician has recommended you make the following change in your medication:  STOP Plavix   *If you need a refill on your cardiac medications before your next appointment, please call your pharmacy*  Lab Work: None ordered  If you have any lab test that is abnormal or we need to change your treatment, we will call you to review the results.  Testing/Procedures: None ordered  Follow-Up: At [facility], you and your health needs are our priority.  As part of our continuing mission to provide you with exceptional heart care, our providers are all part of one team.  This team includes your primary Cardiologist (physician) and Advanced Practice Providers or APPs (Physician Assistants and Nurse Practitioners) who all work together to provide you with the care you need, when you need it.  Your next appointment:   December 2026  Provider:   Alm Clay, MD     Thank you for choosing [facility]!!   [phone]

## 2024-03-22 ENCOUNTER — Other Ambulatory Visit (HOSPITAL_COMMUNITY): Payer: Self-pay

## 2024-03-25 ENCOUNTER — Encounter: Payer: Self-pay | Admitting: Cardiology

## 2024-03-25 DIAGNOSIS — Z01810 Encounter for preprocedural cardiovascular examination: Secondary | ICD-10-CM | POA: Insufficient documentation

## 2024-03-25 DIAGNOSIS — I480 Paroxysmal atrial fibrillation: Secondary | ICD-10-CM | POA: Insufficient documentation

## 2024-03-25 DIAGNOSIS — K8020 Calculus of gallbladder without cholecystitis without obstruction: Secondary | ICD-10-CM | POA: Insufficient documentation

## 2024-03-25 DIAGNOSIS — F410 Panic disorder [episodic paroxysmal anxiety] without agoraphobia: Secondary | ICD-10-CM | POA: Insufficient documentation

## 2024-03-25 NOTE — Assessment & Plan Note (Signed)
 Gallstones without cholecystitis Gallstones present without symptoms. Surgery declined. - No surgical intervention at this time.

## 2024-03-25 NOTE — Assessment & Plan Note (Signed)
 He had PAF in the initial recovery phases from his MI but is not had any further spells. No recent arrhythmia episodes. - Continue carvedilol  3.25 mg twice daily - Continue Eliquis  5 mg twice daily.

## 2024-03-25 NOTE — Assessment & Plan Note (Signed)
 Ejection fraction improved to 35-40%. No arrhythmias or heart failure symptoms => NYHA Class I.   Blood pressure stable, euvolemic.  Has never required furosemide . - Continue carvedilol  3.125 mg BID. - Continue losartan  25 mg daily.  (Unable to tolerate Entresto ) - Continue Farxiga  10 mg daily. - Continue spironolactone  25 mg daily.

## 2024-03-25 NOTE — Assessment & Plan Note (Signed)
 Over 4-1/2 years out from large anterior MI with VF arrest and prolonged CPR with ROSC. Found to have subtotal occluded LAD treated with DES PCI, but despite that he essentially had a completed anterior infarct.  However the EF did improve from 15% up to 30 to 35% now. He now lives an active lifestyle with no active angina or CHF symptoms-NYHA class I CHF

## 2024-03-25 NOTE — Assessment & Plan Note (Signed)
 Occasional panic attacks managed with lifestyle modifications.  Somewhat of a delayed PTSD response. - Continue current management and support.

## 2024-03-25 NOTE — Assessment & Plan Note (Signed)
 Edward Mendoza perioperative risk of a major cardiac event is 6.6% according to the Revised Cardiac Risk Index (RCRI).  Therefore, he is at high risk for perioperative complications -> simply based on prior MI, reduced EF and postarrest related strokes.  However all of these are stable and in the past.  Therefore, clinically would not be high risk.   His functional capacity is excellent at 9.89 METs according to the Duke Activity Status Index (DASI). Recommendations: According to ACC/AHA guidelines, no further cardiovascular testing needed.  The patient may proceed to surgery at acceptable risk.   Antiplatelet and/or Anticoagulation Recommendations: Patient is not on aspirin  and Plavix  has been discontinued Eliquis  (Apixaban ) can be held for 2 to 3 days prior to surgery.  Please resume post op when felt to be safe.

## 2024-03-25 NOTE — Assessment & Plan Note (Signed)
 Remains on Eliquis  5 mg twice daily for CHA2DS2-VASc or 4.  Stroke prophylaxis. We are stopping Plavix  now since he is far enough out from his MI.  Okay to hold Eliquis  2 to 3 days preop for surgeries or procedures.

## 2024-03-25 NOTE — Assessment & Plan Note (Signed)
 Over 4-1/2 years out from anterior MI with VF arrest and prolonged CPR. EF did not improve above the 35% range-remains at the 30 to 35%.  Despite conversations with Dr. Bensimhon in the past, patient has declined ICD.  Has not had any breakthrough arrhythmias.  Continue aggressive management of cardiomyopathy and CAD.

## 2024-03-25 NOTE — Assessment & Plan Note (Signed)
 Significant reduced EF of the anterior MI.  EF is 30 to 35% on most recent evaluation both by echocardiogram and stress PET.  Patient has declined ICD on several occasions. GDMT somewhat limited by low blood pressures-not able to tolerate Entresto , but is not requiring any diuretic.

## 2024-03-25 NOTE — Assessment & Plan Note (Signed)
 Multiple CVAs noted postarrest likely from prolonged CPR with global ischemia.  No residual effect. Quite possible that he had some of these related to his A-fib.  Remains on Eliquis .

## 2024-03-25 NOTE — Assessment & Plan Note (Signed)
 Single-vessel CAD with LAD PCI On combination 40 mg rosuvastatin  plus Repatha  for lipids along with 3.25 mg twice daily carvedilol  and 5 mg losartan  for BP control. He is> 4.5 years post-stent. Stent likely healed. - Discontinue Plavix . - Continue Eliquis .

## 2024-03-25 NOTE — Assessment & Plan Note (Signed)
 STEMI lipid control based on last labs with LDL 35.  This is with a combination of rosuvastatin  40 [place] daily and Repatha  140 mg every 2 weeks. - Continue current regimen.

## 2024-04-02 ENCOUNTER — Other Ambulatory Visit: Payer: Self-pay | Admitting: Cardiology

## 2024-04-18 ENCOUNTER — Other Ambulatory Visit (HOSPITAL_COMMUNITY): Payer: Self-pay

## 2024-05-02 ENCOUNTER — Other Ambulatory Visit (HOSPITAL_COMMUNITY): Payer: Self-pay | Admitting: Internal Medicine

## 2024-05-02 ENCOUNTER — Other Ambulatory Visit: Payer: Self-pay | Admitting: Cardiology

## 2024-05-02 DIAGNOSIS — I480 Paroxysmal atrial fibrillation: Secondary | ICD-10-CM

## 2024-05-02 MED ORDER — APIXABAN 5 MG PO TABS: 5.0000 mg | ORAL_TABLET | Freq: Two times a day (BID) | ORAL | 5 refills | Status: AC

## 2024-05-02 NOTE — Telephone Encounter (Signed)
 Prescription refill request for Eliquis  received. Indication:afib Last office visit:11/25 Scr: 1.10  2025 Age:53 Weight:110.9  kg  Prescription refilled

## 2024-05-23 ENCOUNTER — Other Ambulatory Visit (HOSPITAL_COMMUNITY): Payer: Self-pay

## 2024-06-04 ENCOUNTER — Other Ambulatory Visit: Payer: Self-pay

## 2024-06-07 NOTE — Telephone Encounter (Signed)
 Overdue Creatinine and Potassium.  In accordance with refill protocols, please review and address the following requirements before this medication refill can be authorized:  Labs

## 2024-06-08 MED ORDER — LOSARTAN POTASSIUM 25 MG PO TABS: 25.0000 mg | ORAL_TABLET | Freq: Every evening | ORAL | 2 refills | Status: AC
